# Patient Record
Sex: Female | Born: 1965
Health system: Southern US, Community
[De-identification: ages and names within clinical notes are randomized; demographics above are authoritative.]

## PROBLEM LIST (undated history)

## (undated) DIAGNOSIS — F329 Major depressive disorder, single episode, unspecified: Secondary | ICD-10-CM

## (undated) DIAGNOSIS — D4709 Other mast cell neoplasms of uncertain behavior: Secondary | ICD-10-CM

## (undated) DIAGNOSIS — L80 Vitiligo: Secondary | ICD-10-CM

## (undated) DIAGNOSIS — M069 Rheumatoid arthritis, unspecified: Secondary | ICD-10-CM

## (undated) DIAGNOSIS — F419 Anxiety disorder, unspecified: Secondary | ICD-10-CM

## (undated) DIAGNOSIS — E063 Autoimmune thyroiditis: Secondary | ICD-10-CM

## (undated) DIAGNOSIS — M1990 Unspecified osteoarthritis, unspecified site: Secondary | ICD-10-CM

## (undated) DIAGNOSIS — F32A Depression, unspecified: Secondary | ICD-10-CM

## (undated) DIAGNOSIS — E039 Hypothyroidism, unspecified: Secondary | ICD-10-CM

## (undated) HISTORY — PX: BREAST BIOPSY: SHX20

## (undated) HISTORY — PX: BREAST LUMPECTOMY: SHX2

## (undated) HISTORY — PX: APPENDECTOMY: SHX54

## (undated) HISTORY — PX: SHOULDER ACROMIOPLASTY: SHX6093

## (undated) HISTORY — PX: HIP SURGERY: SHX245

## (undated) HISTORY — PX: OTHER SURGICAL HISTORY: SHX169

## (undated) HISTORY — PX: CHOLECYSTECTOMY: SHX55

---

## 2007-02-07 HISTORY — PX: HIP SURGERY: SHX245

## 2017-10-30 ENCOUNTER — Emergency Department (INDEPENDENT_AMBULATORY_CARE_PROVIDER_SITE_OTHER)
Admission: EM | Admit: 2017-10-30 | Discharge: 2017-10-30 | Disposition: A | Discharge status: Home / Self Care | Payer: BLUE CROSS/BLUE SHIELD | Source: Home / Self Care | Attending: Family Medicine | Admitting: Family Medicine

## 2017-10-30 ENCOUNTER — Other Ambulatory Visit: Payer: Self-pay

## 2017-10-30 ENCOUNTER — Encounter: Payer: Self-pay | Admitting: Emergency Medicine

## 2017-10-30 DIAGNOSIS — L719 Rosacea, unspecified: Secondary | ICD-10-CM

## 2017-10-30 HISTORY — DX: Other mast cell neoplasms of uncertain behavior: D47.09

## 2017-10-30 HISTORY — DX: Major depressive disorder, single episode, unspecified: F32.9

## 2017-10-30 HISTORY — DX: Autoimmune thyroiditis: E06.3

## 2017-10-30 HISTORY — DX: Depression, unspecified: F32.A

## 2017-10-30 HISTORY — DX: Anxiety disorder, unspecified: F41.9

## 2017-10-30 MED ORDER — PREDNISONE 50 MG PO TABS: 50.0000 mg | ORAL_TABLET | Freq: Every day | ORAL | 0 refills | Status: AC

## 2017-10-30 MED ORDER — DEXAMETHASONE SODIUM PHOSPHATE 10 MG/ML IJ SOLN
10.0000 mg | Freq: Once | INTRAMUSCULAR | Status: AC
  Administered 2017-10-30: 10 mg via INTRAMUSCULAR

## 2017-10-30 MED ORDER — CETIRIZINE HCL 10 MG PO TABS: 10.0000 mg | ORAL_TABLET | Freq: Every day | ORAL | 0 refills | Status: DC

## 2017-10-30 MED ORDER — METRONIDAZOLE 0.75 % EX GEL: 1.0000 "application " | Freq: Two times a day (BID) | CUTANEOUS | 0 refills | Status: DC

## 2017-10-30 NOTE — Discharge Instructions (Signed)
°  You were given a shot of decadron (a steroid) today to help with itching and inflammation of your rash.  You have been prescribed 5 days of prednisone, an oral steroid.  You may start this medication tomorrow with breakfast.   Please follow up with family medicine or dermatology in 1 week if not improving.

## 2017-10-30 NOTE — ED Triage Notes (Signed)
Pt c/o rash to face that started on Monday.

## 2017-10-30 NOTE — ED Provider Notes (Signed)
[facility]    CSN: [id number] Arrival date & time: 10/30/17  2751     History   Chief Complaint Chief Complaint  Patient presents with  . Rash    Face    HPI Kayla Lindsey is a 52 y.o. female.   HPI  Kayla Lindsey is a 52 y.o. female presenting to UC with hx of rosacea c/o 1 week worsening red itching, burning irritating rash on her face c/w prior episodes of rosacea. She has been using her prescribed medications but notes one of the creams she was prescribed is starting to burn when she applies it.  Denies new soaps, lotions or medications. She has done well with prednisone in the past for her rash. She has also used metronidazole cream for flares.     Past Medical History:  Diagnosis Date  . Anxiety   . Depression   . Hashimoto's thyroiditis   . Mastocytosis     There are no active problems to display for this patient.   Past Surgical History:  Procedure Laterality Date  . APPENDECTOMY    . carpel tunnel    . CHOLECYSTECTOMY    . HIP SURGERY    . SHOULDER ACROMIOPLASTY      OB History   None      Home Medications    Prior to Admission medications   Medication Sig Start Date End Date Taking? Authorizing Provider  levothyroxine (SYNTHROID, LEVOTHROID) 125 MCG tablet Take 125 mcg by mouth daily before breakfast.   Yes [provider]  venlafaxine (EFFEXOR) 100 MG tablet Take 225 mg by mouth 2 (two) times daily.   Yes [provider]  cetirizine (ZYRTEC) 10 MG tablet Take 1 tablet (10 mg total) by mouth daily. 10/30/17   Noe Gens, PA-C  metroNIDAZOLE (METROGEL) 0.75 % gel Apply 1 application topically 2 (two) times daily. 10/30/17   Noe Gens, PA-C  predniSONE (DELTASONE) 50 MG tablet Take 1 tablet (50 mg total) by mouth daily with breakfast for 5 days. 10/30/17 11/04/17  Noe Gens, PA-C    Family History Family History  Problem Relation Age of Onset  . Thyroid disease Mother   . Heart disease Mother     . COPD Mother   . Cancer Mother   . COPD Father     Social History Social History   Tobacco Use  . Smoking status: Never Smoker  . Smokeless tobacco: Never Used  Substance Use Topics  . Alcohol use: Not Currently    Frequency: Never  . Drug use: Not Currently     Allergies   Patient has no known allergies.   Review of Systems Review of Systems  HENT: Negative for facial swelling and sore throat.   Skin: Positive for color change and rash. Negative for wound.     Physical Exam Triage Vital Signs ED Triage Vitals [10/30/17 1013]  Enc Vitals Group     BP 122/79     Pulse Rate 81     Resp      Temp 98.2 F (36.8 C)     Temp Source Oral     SpO2 99 %     Weight 166 lb (75.3 kg)     Height      Head Circumference      Peak Flow      Pain Score 3     Pain Loc      Pain Edu?      Excl.  in [place]?    No data found.  Updated Vital Signs BP 122/79 (BP Location: Left Arm)   Pulse 81   Temp 98.2 F (36.8 C) (Oral)   Wt 166 lb (75.3 kg)   LMP 10/02/2017 (Exact Date)   SpO2 99%   Visual Acuity Right Eye Distance:   Left Eye Distance:   Bilateral Distance:    Right Eye Near:   Left Eye Near:    Bilateral Near:     Physical Exam  Constitutional: She is oriented to person, place, and time. She appears well-developed and well-nourished. No distress.  HENT:  Head: Normocephalic and atraumatic.  Eyes: EOM are normal.  Neck: Normal range of motion.  Cardiovascular: Normal rate.  Pulmonary/Chest: Effort normal.  Musculoskeletal: Normal range of motion.  Neurological: She is alert and oriented to person, place, and time.  Skin: Skin is warm and dry. Rash noted. She is not diaphoretic. There is erythema.  Face: erythematous macular rash on cheeks and around eyes. Minimally tender. No bleeding or drainage.   Psychiatric: She has a normal mood and affect. Her behavior is normal.  Nursing note and vitals reviewed.    UC Treatments / Results  Labs (all labs  ordered are listed, but only abnormal results are displayed) Labs Reviewed - No data to display  EKG None  Radiology No results found.  Procedures Procedures (including critical care time)  Medications Ordered in UC Medications  dexamethasone (DECADRON) injection 10 mg (10 mg Intramuscular Given 10/30/17 1054)    Initial Impression / Assessment and Plan / UC Course  I have reviewed the triage vital signs and the nursing notes.  Pertinent labs & imaging results that were available during my care of the patient were reviewed by me and considered in my medical decision making (see chart for details).     Will tx with steroids and metronidazole Home instructions provided.  Final Clinical Impressions(s) / UC Diagnoses   Final diagnoses:  Rosacea     Discharge Instructions      You were given a shot of decadron (a steroid) today to help with itching and inflammation of your rash.  You have been prescribed 5 days of prednisone, an oral steroid.  You may start this medication tomorrow with breakfast.   Please follow up with family medicine or dermatology in 1 week if not improving.     ED Prescriptions    Medication Sig Dispense Auth. Provider   predniSONE (DELTASONE) 50 MG tablet Take 1 tablet (50 mg total) by mouth daily with breakfast for 5 days. 5 tablet Leeroy Cha O, PA-C   cetirizine (ZYRTEC) 10 MG tablet Take 1 tablet (10 mg total) by mouth daily. 30 tablet Gerarda Fraction, Karisma Meiser O, PA-C   metroNIDAZOLE (METROGEL) 0.75 % gel Apply 1 application topically 2 (two) times daily. 45 g Noe Gens, PA-C     Controlled Substance Prescriptions [place] Controlled Substance Registry consulted? Not Applicable   Tyrell Antonio 10/30/17 1239

## 2017-12-23 ENCOUNTER — Ambulatory Visit (INDEPENDENT_AMBULATORY_CARE_PROVIDER_SITE_OTHER): Payer: No Typology Code available for payment source | Admitting: Osteopathic Medicine

## 2017-12-23 ENCOUNTER — Encounter: Payer: Self-pay | Admitting: Osteopathic Medicine

## 2017-12-23 VITALS — BP 132/85 | HR 82 | Temp 98.0°F | Ht 67.0 in | Wt 174.0 lb

## 2017-12-23 DIAGNOSIS — L80 Vitiligo: Secondary | ICD-10-CM | POA: Insufficient documentation

## 2017-12-23 DIAGNOSIS — F32A Depression, unspecified: Secondary | ICD-10-CM | POA: Insufficient documentation

## 2017-12-23 DIAGNOSIS — F419 Anxiety disorder, unspecified: Secondary | ICD-10-CM | POA: Diagnosis not present

## 2017-12-23 DIAGNOSIS — K1370 Unspecified lesions of oral mucosa: Secondary | ICD-10-CM

## 2017-12-23 DIAGNOSIS — D4709 Other mast cell neoplasms of uncertain behavior: Secondary | ICD-10-CM

## 2017-12-23 DIAGNOSIS — L719 Rosacea, unspecified: Secondary | ICD-10-CM

## 2017-12-23 DIAGNOSIS — E039 Hypothyroidism, unspecified: Secondary | ICD-10-CM

## 2017-12-23 DIAGNOSIS — F329 Major depressive disorder, single episode, unspecified: Secondary | ICD-10-CM

## 2017-12-23 DIAGNOSIS — R1310 Dysphagia, unspecified: Secondary | ICD-10-CM

## 2017-12-23 DIAGNOSIS — R1319 Other dysphagia: Secondary | ICD-10-CM

## 2017-12-23 MED ORDER — VENLAFAXINE HCL ER 75 MG PO CP24: 225.0000 mg | ORAL_CAPSULE | Freq: Every day | ORAL | 1 refills | Status: DC

## 2017-12-23 MED ORDER — LEVOTHYROXINE SODIUM 125 MCG PO TABS: 125.0000 ug | ORAL_TABLET | Freq: Every day | ORAL | 3 refills | Status: DC

## 2017-12-23 MED ORDER — CETIRIZINE HCL 10 MG PO TABS: 10.0000 mg | ORAL_TABLET | Freq: Every day | ORAL | 3 refills | Status: DC

## 2017-12-23 MED ORDER — SODIUM SULFACETAMIDE 10 % EX SHAM: 1.0000 "application " | MEDICATED_SHAMPOO | Freq: Two times a day (BID) | CUTANEOUS | 11 refills | Status: DC

## 2017-12-23 MED ORDER — BUPROPION HCL ER (XL) 150 MG PO TB24: 150.0000 mg | ORAL_TABLET | Freq: Every day | ORAL | 1 refills | Status: DC

## 2017-12-23 MED ORDER — IVERMECTIN 1 % EX CREA: 1.0000 "application " | TOPICAL_CREAM | Freq: Two times a day (BID) | CUTANEOUS | 11 refills | Status: DC

## 2017-12-23 MED FILL — LEVOTHYROXINE 125 MCG TAB: 125 | 90 days supply | Qty: 90 | Fill #0

## 2017-12-23 MED FILL — VENLAFAXINE HCL ER 75 MG CA: 75 | 90 days supply | Qty: 270 | Fill #0

## 2017-12-23 MED FILL — buPROPion HCL ER (XL) 150 M: 150 | 30 days supply | Qty: 30 | Fill #0

## 2017-12-23 MED FILL — SOD SULFACETAMIDE 10% SHAMP: 10 | 30 days supply | Qty: 237 | Fill #0

## 2017-12-23 NOTE — Patient Instructions (Signed)
Plan:  Switch Allegra to cetirizine  Add Wellbutrin to Effexor  Refill Synthroid and rosacea medications  Monitor lump in throat/mouth, consider specialist referral if needed  Mammogram order placed today  Will request records today from previous Pap and colonoscopy

## 2017-12-23 NOTE — Progress Notes (Signed)
HPI: Kayla Lindsey is a 52 y.o. female who  has a past medical history of Anxiety, Depression, Hashimoto's thyroiditis, and Mastocytosis.  she presents to [facility] today, 12/23/17,  for chief complaint of: New to establish care Refill prescriptions for rosacea Discuss different prescriptions for anxiety/depression New problem: Lump in throat, lump on recent mouth   Doing well on current medications for rosacea, previously prescribed by dermatology, requests refill.  Depression has been a bit worse over the past couple of years.  She has been on fairly high dose of Effexor for years, withdrawal issues when tried to get off of this medication in the past.  She does not have any significant side effects from it and is reluctant to come off again but is struggling with moods.  Felt better on cetirizine than fexofenadine, would like to continue with this medication if possible for treatment of itching/mastocytosis and seasonal allergies.  Feels like there is a small lump on the risk of her mouth, has been going on for a couple of days, she has not been able to see anything coarse but she can feel or small rough patch toward the back of her throat on the right.  Feels like difficulty swallowing/lump in the throat.  Has been going on for about 2 weeks she only really feels it with swallowing.  No painful swallowing, no regurgitation, no abdominal pain, no GERD.   Available records reviewed 09/13/2017: Platelets 410 but no other concerns on CBC, TSH 0.672, concerns on CMP, TC 176, TG 60, HDL 74, LDL 90, glucose 101  Last visit with family med 09/13/2017, to establish care, this was in [place], documented diagnoses included major depressive disorder in partial remission (referral placed for psych), hypothyroidism, primary vitiligo (referal placed for Derm), screening for cervical cancer (referral placed for OB/GYN) and breast cancer, status post colonoscopy 5  years ago due to abdominal pain and this was reportedly normal.  History of abnormal mammogram and lumpectomy revealed ductal hyperplasia.  Also at that visit, started prescription for venlafaxine extended release 225 mg.  Patient was already taking Allegra-D for history of mastocytosis, Synthroid 125 mcg.    Past medical, surgical, social and family history reviewed:  Patient Active Problem List   Diagnosis Date Noted  . Lesion of mouth 12/23/2017  . Esophageal dysphagia 12/23/2017  . Mastocytosis 12/23/2017  . Vitiligo 12/23/2017  . Rosacea 12/23/2017  . Hypothyroidism 12/23/2017  . Anxiety and depression 12/23/2017    Past Surgical History:  Procedure Laterality Date  . APPENDECTOMY    . carpel tunnel    . CHOLECYSTECTOMY    . HIP SURGERY    . SHOULDER ACROMIOPLASTY      Social History   Tobacco Use  . Smoking status: Never Smoker  . Smokeless tobacco: Never Used  Substance Use Topics  . Alcohol use: Not Currently    Frequency: Never    Family History  Problem Relation Age of Onset  . Thyroid disease Mother   . Heart disease Mother   . COPD Mother   . Cancer Mother   . COPD Father      Current medication list and allergy/intolerance information reviewed:    Current Outpatient Medications  Medication Sig Dispense Refill  . cetirizine (ZYRTEC) 10 MG tablet Take 1 tablet (10 mg total) by mouth daily. 30 tablet 0  . levothyroxine (SYNTHROID, LEVOTHROID) 125 MCG tablet Take 125 mcg by mouth daily before breakfast.    . metroNIDAZOLE (METROGEL)  0.75 % gel Apply 1 application topically 2 (two) times daily. 45 g 0  . venlafaxine (EFFEXOR) 100 MG tablet Take 225 mg by mouth 2 (two) times daily.     No current facility-administered medications for this visit.     No Known Allergies    Review of Systems:  Constitutional:  No  fever, no chills, No recent illness, No unintentional weight changes. No significant fatigue.   HEENT: No  headache, no vision  change  Cardiac: No  chest pain, No  pressure, No palpitations, No  Orthopnea  Respiratory:  No  shortness of breath. No  Cough  Gastrointestinal: No  abdominal pain, No  nausea, No  vomiting,  No  blood in stool, No  diarrhea, No  Constipation, +mouth and swallowing issue as per HPI  Musculoskeletal: No new myalgia/arthralgia  Skin: No  Rash, No other wounds/concerning lesions  Genitourinary: No  incontinence, No  abnormal genital bleeding, No abnormal genital discharge  Hem/Onc: No  easy bruising/bleeding, No  abnormal lymph node  Endocrine: No cold intolerance,  No heat intolerance. No polyuria/polydipsia/polyphagia   Neurologic: No  weakness, No  dizziness, No  slurred speech/focal weakness/facial droop  Psychiatric: +concerns with depression, +concerns with anxiety, No sleep problems, No mood problems  Exam:  BP 132/85 (BP Location: Right Arm, Patient Position: Sitting, Cuff Size: Normal)   Pulse 82   Temp 98 F (36.7 C) (Oral)   Ht 5\' 7"  (1.702 m)   Wt 174 lb (78.9 kg)   BMI 27.25 kg/m   Constitutional: VS see above. General Appearance: alert, well-developed, well-nourished, NAD  Eyes: Normal lids and conjunctive, non-icteric sclera  Ears, Nose, Mouth, Throat: MMM, Normal external inspection ears/nares/mouth/lips/gums. TM normal bilaterally. Pharynx/tonsils no erythema, no exudate. Nasal mucosa normal.  No lesion visible on the roof of the mouth  Neck: No masses, trachea midline. No thyroid enlargement. No tenderness/mass appreciated. No lymphadenopathy  Respiratory: Normal respiratory effort. no wheeze, no rhonchi, no rales  Cardiovascular: S1/S2 normal, no murmur, no rub/gallop auscultated. RRR. No lower extremity edema.   Gastrointestinal: Nontender, no masses. No hepatomegaly, no splenomegaly. No hernia appreciated. Bowel sounds normal. Rectal exam deferred.   Musculoskeletal: Gait normal. No clubbing/cyanosis of digits.   Neurological: Normal  balance/coordination. No tremor. No cranial nerve deficit on limited exam. Motor and sensation intact and symmetric. Cerebellar reflexes intact.   Skin: warm, dry, intact. No rash/ulcer. No concerning nevi or subq nodules on limited exam.    Psychiatric: Normal judgment/insight. Normal mood and affect. Oriented x3.         ASSESSMENT/PLAN:   Anxiety and depression - Plan: venlafaxine XR (EFFEXOR-XR) 75 MG 24 hr capsule, buPROPion (WELLBUTRIN XL) 150 MG 24 hr tablet  Hypothyroidism, unspecified type - Plan: levothyroxine (SYNTHROID, LEVOTHROID) 125 MCG tablet  Rosacea - Plan: Ivermectin (SOOLANTRA) 1 % CREA, Sulfacetamide Sodium (SODIUM SULFACETAMIDE) 10 % SHAM  Vitiligo  Mastocytosis - Plan: cetirizine (ZYRTEC) 10 MG tablet  Esophageal dysphagia - Advised GI referral, patient would like to hold off.  Lesion of mouth - Consider ENT referral for possible further evaluation/biopsy, patient would like to hold off for now    Patient Instructions  Plan:  Switch Allegra to cetirizine  Add Wellbutrin to Effexor  Refill Synthroid and rosacea medications  Monitor lump in throat/mouth, consider specialist referral if needed  Mammogram order placed today  Will request records today from previous Pap and colonoscopy    Visit summary with medication list and pertinent instructions was printed for  patient to review. All questions at time of visit were answered - patient instructed to contact office with any additional concerns. ER/RTC precautions were reviewed with the patient.   Follow-up plan: Return in about 4 weeks (around 01/20/2018) for recheck on wellbutrin, see me sooner if needed.     Please note: voice recognition software was used to produce this document, and typos may escape review. Please contact Dr. Sheppard Coil for any needed clarifications.

## 2017-12-27 ENCOUNTER — Telehealth: Payer: Self-pay

## 2017-12-27 MED ORDER — SULFACETAMIDE SODIUM 10 % EX CREA: 1.0000 "application " | TOPICAL_CREAM | Freq: Two times a day (BID) | CUTANEOUS | 11 refills | Status: DC

## 2017-12-27 NOTE — Telephone Encounter (Signed)
Current rx sent is cream form. Pt requesting face wash. Pls advise, thanks.

## 2017-12-27 NOTE — Telephone Encounter (Signed)
Fixed it.

## 2017-12-27 NOTE — Telephone Encounter (Signed)
Can we call the pharmacy and see if they can just send [place] a request for whatever she needs?  In epic, I only see that I'm able to Rx shampoo, cream, foam, gel, ointment, liquid. Nothing specific for soap or wash.

## 2017-12-27 NOTE — Telephone Encounter (Signed)
Megan from [facility] called - as per pt, she needs face wash for Rosacea. Per pharmacy, sulfacetamide sodium sent as a shampoo rx. Pt did pick up the rx on Friday, but contacted pharmacy today stating that she has the wrong rx. Pls advise, thanks.

## 2017-12-28 MED ORDER — SULFACETAMIDE IN BAKUCHIOL 10 % EX LIQD: CUTANEOUS | 1 refills | Status: DC

## 2017-12-28 NOTE — Telephone Encounter (Signed)
I called and spoke with Manuela Schwartz at [facility] and she said to send the RX as a face wash and the pharmacist could fix as face wash for the patient.

## 2017-12-30 ENCOUNTER — Telehealth: Payer: Self-pay

## 2017-12-30 ENCOUNTER — Other Ambulatory Visit: Payer: Self-pay | Admitting: Osteopathic Medicine

## 2017-12-30 MED ORDER — AZELAIC ACID 15 % EX GEL: 1.0000 "application " | Freq: Two times a day (BID) | CUTANEOUS | 11 refills | Status: DC

## 2017-12-30 MED ORDER — AZELAIC ACID 15 % EX FOAM: 1.0000 "application " | Freq: Two times a day (BID) | CUTANEOUS | 1 refills | Status: DC

## 2017-12-30 NOTE — Telephone Encounter (Signed)
Whatever pharmacist recommends as an alternative is acceptable to me

## 2017-12-30 NOTE — Telephone Encounter (Signed)
As per [facility] foam is on long term backorder. Requesting to change med to gel form. Pls advise. Thanks.

## 2017-12-30 NOTE — Progress Notes (Signed)
fin

## 2017-12-30 NOTE — Telephone Encounter (Signed)
Pls send rx to pharmacy for finacea gel. Thanks.

## 2017-12-30 NOTE — Telephone Encounter (Addendum)
Rx fax form faxed to pharmacy with update. Confirmation rec'd.

## 2018-01-11 MED FILL — OVACE PLUS 10 % CREA: 10 | 30 days supply | Qty: 57 | Fill #0

## 2018-01-11 MED FILL — SODIUM SULFACETAMIDE 10% WA: 10 | 25 days supply | Qty: 177 | Fill #0

## 2018-01-17 ENCOUNTER — Other Ambulatory Visit: Payer: Self-pay | Admitting: Osteopathic Medicine

## 2018-01-17 DIAGNOSIS — Z1231 Encounter for screening mammogram for malignant neoplasm of breast: Secondary | ICD-10-CM

## 2018-01-20 ENCOUNTER — Encounter: Payer: Self-pay | Admitting: Osteopathic Medicine

## 2018-01-20 ENCOUNTER — Ambulatory Visit (INDEPENDENT_AMBULATORY_CARE_PROVIDER_SITE_OTHER): Payer: No Typology Code available for payment source | Admitting: Osteopathic Medicine

## 2018-01-20 VITALS — BP 118/71 | HR 86 | Temp 97.8°F | Wt 168.9 lb

## 2018-01-20 DIAGNOSIS — F329 Major depressive disorder, single episode, unspecified: Secondary | ICD-10-CM

## 2018-01-20 DIAGNOSIS — F419 Anxiety disorder, unspecified: Secondary | ICD-10-CM | POA: Diagnosis not present

## 2018-01-20 DIAGNOSIS — F32A Depression, unspecified: Secondary | ICD-10-CM

## 2018-01-20 MED ORDER — VENLAFAXINE HCL ER 37.5 MG PO CP24: 37.5000 mg | ORAL_CAPSULE | Freq: Every day | ORAL | 0 refills | Status: DC

## 2018-01-20 MED ORDER — ESCITALOPRAM OXALATE 10 MG PO TABS: 10.0000 mg | ORAL_TABLET | Freq: Every day | ORAL | 0 refills | Status: DC

## 2018-01-20 MED FILL — VENLAFAXINE HCL ER 37.5 MG: 37.5 | 30 days supply | Qty: 30 | Fill #0

## 2018-01-20 MED FILL — ESCITALOPRAM 10 MG TABLET: 10 | 90 days supply | Qty: 90 | Fill #0

## 2018-01-20 NOTE — Progress Notes (Signed)
HPI: Kayla Lindsey is a 51 y.o. female who  has a past medical history of Anxiety, Depression, Hashimoto's thyroiditis, and Mastocytosis.  she presents to [facility] today, 01/20/18,  for chief complaint of:  Follow-up: Anxiety/depression  At last visit 1 month ago, 12/23/2017: "Depression has been a bit worse over the past couple of years.  She has been on fairly high dose of Effexor for years, withdrawal issues when tried to get off of this medication in the past.  She does not have any significant side effects from it and is reluctant to come off again but is struggling with moods."  Sided to add Wellbutrin to Effexor and follow-up for recheck.  Today, patient reports she is doing well with the addition of the Wellbutrin, no significant side effects.  She has some concerns for not sleeping very well and occasional night sweats but otherwise she is doing fine.       Past medical history, surgical history, and family history reviewed.  Current medication list and allergy/intolerance information reviewed.   (See remainder of HPI, ROS, Phys Exam below)      ASSESSMENT/PLAN:   Anxiety and depression Would like to get off Effexor but has had withdrawal problems before. Mom does well on Lexapro and she'd like to try this for anxiety.    Meds ordered this encounter  Medications  . escitalopram (LEXAPRO) 10 MG tablet    Sig: Take 1 tablet (10 mg total) by mouth daily. Evenings preferred. Take as directed    Dispense:  90 tablet    Refill:  0  . venlafaxine XR (EFFEXOR-XR) 37.5 MG 24 hr capsule    Sig: Take 1 capsule (37.5 mg total) by mouth daily with breakfast. As directed    Dispense:  30 capsule    Refill:  0    Patient Instructions  Stay on Wellbutrin  Follow this medication schedule for 1-2 weeks each,  depending how you're feeling:  Effexor 75 mg, 2 capsules per day  Then Effexor 75 mg, 1 capsule per day +   Lexapro 10 mg, 0.5  tablet per day  Then Effexor 37.5 mg, 1 capsule per day +   Lexapro 10 mg, 1 tablet per day   Then Lexapro 10 mg, 1 tablet per day  Option to increase Lexapro 10 mg to 2 tablets per day for 20 mg total, or can maintain for a little while on 10 mg dose if you're doing well    Follow-up plan: Return in about 4 weeks (around 02/17/2018) for monitor switching from Effexor to Lexapro .                           ############################################ ############################################ ############################################ ############################################    Outpatient Encounter Medications as of 01/20/2018  Medication Sig  . Azelaic Acid 15 % cream Apply 1 application topically 2 (two) times daily. After skin is thoroughly washed and patted dry  . buPROPion (WELLBUTRIN XL) 150 MG 24 hr tablet Take 1 tablet (150 mg total) by mouth daily.  . cetirizine (ZYRTEC) 10 MG tablet Take 1 tablet (10 mg total) by mouth daily.  Marland Kitchen levothyroxine (SYNTHROID, LEVOTHROID) 125 MCG tablet Take 1 tablet (125 mcg total) by mouth daily before breakfast.  . Sulfacetamide in Bakuchiol (SODIUM SULFACETAMIDE WASH) 10 % LIQD Apply to face topically twice a day as face wash.  . venlafaxine XR (EFFEXOR-XR) 75 MG 24 hr capsule Take 3 capsules (  225 mg total) by mouth daily with breakfast.   No facility-administered encounter medications on file as of 01/20/2018.    Not on File    Review of Systems:  Constitutional: No recent illness  Cardiac: No  chest pain, No  pressure, No palpitations  Respiratory:  No  shortness of breath. No  Cough  Gastrointestinal: No  abdominal pain  Musculoskeletal: No new myalgia/arthralgia  Skin: No  Rash  Hem/Onc: No  easy bruising/bleeding, No  abnormal lumps/bumps  Neurologic: No  weakness, No  Dizziness  Psychiatric: No  concerns with depression, +concerns with anxiety, +sleep problems   Exam:  BP 118/71 (BP  Location: Left Arm, Patient Position: Sitting, Cuff Size: Normal)   Pulse 86   Temp 97.8 F (36.6 C) (Oral)   Wt 168 lb 14.4 oz (76.6 kg)   BMI 26.45 kg/m   Constitutional: VS see above. General Appearance: alert, well-developed, well-nourished, NAD  Eyes: Normal lids and conjunctive, non-icteric sclera  Ears, Nose, Mouth, Throat: MMM, Normal external inspection ears/nares/mouth/lips/gums.  Neck: No masses, trachea midline.   Respiratory: Normal respiratory effort.   Musculoskeletal: Gait normal. Symmetric and independent movement of all extremities  Neurological: Normal balance/coordination. No tremor.  Skin: warm, dry, intact.   Psychiatric: Normal judgment/insight. Normal mood and affect. Oriented x3.   Visit summary with medication list and pertinent instructions was printed for patient to review, advised to alert [place] if any changes needed. All questions at time of visit were answered - patient instructed to contact office with any additional concerns. ER/RTC precautions were reviewed with the patient and understanding verbalized.   Follow-up plan: Return in about 4 weeks (around 02/17/2018) for monitor switching from Effexor to Lexapro .  Note: Total time spent 25 minutes, greater than 50% of the visit was spent face-to-face counseling and coordinating care for the following: The encounter diagnosis was Anxiety and depression..  Please note: voice recognition software was used to produce this document, and typos may escape review. Please contact Dr. Sheppard Coil for any needed clarifications.

## 2018-01-20 NOTE — Patient Instructions (Addendum)
Stay on Wellbutrin  Follow this medication schedule for 1-2 weeks each,  depending how you're feeling:  Effexor 75 mg, 2 capsules per day  Then Effexor 75 mg, 1 capsule per day +   Lexapro 10 mg, 0.5 tablet per day  Then Effexor 37.5 mg, 1 capsule per day +   Lexapro 10 mg, 1 tablet per day   Then Lexapro 10 mg, 1 tablet per day  Option to increase Lexapro 10 mg to 2 tablets per day for 20 mg total, or can maintain for a little while on 10 mg dose if you're doing well

## 2018-01-24 MED FILL — buPROPion HCL ER (XL) 150 M: 150 | 30 days supply | Qty: 30 | Fill #1

## 2018-02-01 ENCOUNTER — Ambulatory Visit (INDEPENDENT_AMBULATORY_CARE_PROVIDER_SITE_OTHER): Payer: No Typology Code available for payment source | Admitting: Osteopathic Medicine

## 2018-02-01 VITALS — BP 126/78 | HR 89 | Temp 98.2°F | Wt 172.9 lb

## 2018-02-01 DIAGNOSIS — K645 Perianal venous thrombosis: Secondary | ICD-10-CM | POA: Diagnosis not present

## 2018-02-01 MED ORDER — DOCUSATE SODIUM 100 MG PO CAPS: 100.0000 mg | ORAL_CAPSULE | Freq: Three times a day (TID) | ORAL | 0 refills | Status: DC | PRN

## 2018-02-01 MED ORDER — HYDROCORTISONE ACETATE 25 MG RE SUPP: 25.0000 mg | Freq: Two times a day (BID) | RECTAL | 2 refills | Status: DC | PRN

## 2018-02-01 MED ORDER — LIDOCAINE HCL 2 % EX GEL: 1.0000 "application " | CUTANEOUS | 1 refills | Status: DC | PRN

## 2018-02-01 MED ORDER — LIDOCAINE 4 % EX CREA: 1.0000 "application " | TOPICAL_CREAM | CUTANEOUS | 0 refills | Status: DC | PRN

## 2018-02-01 MED FILL — LIDOCAINE HCL 2% JELLY: 2 | 7 days supply | Qty: 30 | Fill #0

## 2018-02-01 MED FILL — HYDROCORTISONE ACETATE 25 M: 25 | 6 days supply | Qty: 12 | Fill #0

## 2018-02-01 NOTE — Patient Instructions (Addendum)
Meds ordered this encounter  Medications  . hydrocortisone (ANUSOL-HC) 25 MG suppository -  Steroid suppository to help hemorrhoid inflammation    Sig: Place 1 suppository (25 mg total) rectally 2 (two) times daily as needed for hemorrhoids.    Dispense:  12 suppository    Refill:  2  . lidocaine (XYLOCAINE) 2 % jelly -  Numbing medicine - apply to hemorrhoid, avoid getting this inside the rectum     Sig: Apply 1 application topically as needed. Every 2-3 hours    Dispense:  8 mL    Refill:  1  . docusate sodium (COLACE) 100 MG capsule Stool softener    Sig: Take 1 capsule (100 mg total) by mouth 3 (three) times daily as needed.    Dispense:  30 capsule    Refill:  0      Hemorrhoids Hemorrhoids are swollen veins in and around the rectum or anus. There are two types of hemorrhoids:  Internal hemorrhoids. These occur in the veins that are just inside the rectum. They may poke through to the outside and become irritated and painful.  External hemorrhoids. These occur in the veins that are outside of the anus and can be felt as a painful swelling or hard lump near the anus.  Most hemorrhoids do not cause serious problems, and they can be managed with home treatments such as diet and lifestyle changes. If home treatments do not help your symptoms, procedures can be done to shrink or remove the hemorrhoids. What are the causes? This condition is caused by increased pressure in the anal area. This pressure may result from various things, including:  Constipation.  Straining to have a bowel movement.  Diarrhea.  Pregnancy.  Obesity.  Sitting for long periods of time.  Heavy lifting or other activity that causes you to strain.  Anal sex.  What are the signs or symptoms? Symptoms of this condition include:  Pain.  Anal itching or irritation.  Rectal bleeding.  Leakage of stool (feces).  Anal swelling.  One or more lumps around the anus.  How is this  diagnosed? This condition can often be diagnosed through a visual exam. Other exams or tests may also be done, such as:  Examination of the rectal area with a gloved hand (digital rectal exam).  Examination of the anal canal using a small tube (anoscope).  A blood test, if you have lost a significant amount of blood.  A test to look inside the colon (sigmoidoscopy or colonoscopy).  How is this treated? This condition can usually be treated at home. However, various procedures may be done if dietary changes, lifestyle changes, and other home treatments do not help your symptoms. These procedures can help make the hemorrhoids smaller or remove them completely. Some of these procedures involve surgery, and others do not. Common procedures include:  Rubber band ligation. Rubber bands are placed at the base of the hemorrhoids to cut off the blood supply to them.  Sclerotherapy. Medicine is injected into the hemorrhoids to shrink them.  Infrared coagulation. A type of light energy is used to get rid of the hemorrhoids.  Hemorrhoidectomy surgery. The hemorrhoids are surgically removed, and the veins that supply them are tied off.  Stapled hemorrhoidopexy surgery. A circular stapling device is used to remove the hemorrhoids and use staples to cut off the blood supply to them.  Follow these instructions at home: Eating and drinking  Eat foods that have a lot of fiber in them, such  as whole grains, beans, nuts, fruits, and vegetables. Ask your health care provider about taking products that have added fiber (fiber supplements).  Drink enough fluid to keep your urine clear or pale yellow. Managing pain and swelling  Take warm sitz baths for 20 minutes, 3-4 times a day to ease pain and discomfort.  If directed, apply ice to the affected area. Using ice packs between sitz baths may be helpful. ? Put ice in a plastic bag. ? Place a towel between your skin and the bag. ? Leave the ice on for 20  minutes, 2-3 times a day. General instructions  Take over-the-counter and prescription medicines only as told by your health care provider.  Use medicated creams or suppositories as told.  Exercise regularly.  Go to the bathroom when you have the urge to have a bowel movement. Do not wait.  Avoid straining to have bowel movements.  Keep the anal area dry and clean. Use wet toilet paper or moist towelettes after a bowel movement.  Do not sit on the toilet for long periods of time. This increases blood pooling and pain. Contact a health care provider if:  You have increasing pain and swelling that are not controlled by treatment or medicine.  You have uncontrolled bleeding.  You have difficulty having a bowel movement, or you are unable to have a bowel movement.  You have pain or inflammation outside the area of the hemorrhoids. This information is not intended to replace advice given to you by your health care provider. Make sure you discuss any questions you have with your health care provider. Document Released: 02/21/2000 Document Revised: 07/24/2015 Document Reviewed: 11/07/2014 Elsevier Interactive Patient Education  [employer].

## 2018-02-01 NOTE — Progress Notes (Signed)
HPI: Kayla Lindsey is a 52 y.o. female who  has a past medical history of Anxiety, Depression, Hashimoto's thyroiditis, and Mastocytosis.  she presents to [facility] today, 02/01/18,  for chief complaint of:  Hemorrhoid  Constipatin, hard BM over the weekend after doing a salt cleanse. After this, the hemorrhoid appeared. Painful w/ sitting/standing, w/ BM, pain is waking her up. Has been having some bleeding w/ BM and wiping and staining her underwear. Pain still present    Patient is accompanied by husband who assists with history-taking.    At today's visit... Past medical history, surgical history, and family history reviewed and updated as needed.  Current medication list and allergy/intolerance information reviewed and updated as needed. (See remainder of HPI, ROS, Phys Exam below)           ASSESSMENT/PLAN: The encounter diagnosis was External thrombosed hemorrhoids.   Procedure note: Excision of external hemorrhoid thrombosis.  Risks versus benefits of procedure were explained in detail to the patient including but not limited to risk of bleeding, infection, pain, damage to surrounding tissues, recurrence or persistence of current symptoms.  Patient opted to proceed.  The area was sterilized and draped in the usual fashion, anesthesia was obtained with 2 cc of lidocaine with epinephrine.  Incision was made with scalpel and hemorrhoid was explored with a hemostat, a few small clots were removed.  Patient tolerated procedure well.     Meds ordered this encounter  Medications  . hydrocortisone (ANUSOL-HC) 25 MG suppository    Sig: Place 1 suppository (25 mg total) rectally 2 (two) times daily as needed for hemorrhoids.    Dispense:  12 suppository    Refill:  2  . lidocaine (XYLOCAINE) 2 % jelly    Sig: Apply 1 application topically as needed. Every 2-3 hours    Dispense:  8 mL    Refill:  1  . docusate sodium (COLACE) 100 MG  capsule    Sig: Take 1 capsule (100 mg total) by mouth 3 (three) times daily as needed.    Dispense:  30 capsule    Refill:  0  . lidocaine (LMX) 4 % cream    Sig: Apply 1 application topically as needed.    Dispense:  30 g    Refill:  0    Patient Instructions   Meds ordered this encounter  Medications  . hydrocortisone (ANUSOL-HC) 25 MG suppository -  Steroid suppository to help hemorrhoid inflammation    Sig: Place 1 suppository (25 mg total) rectally 2 (two) times daily as needed for hemorrhoids.    Dispense:  12 suppository    Refill:  2  . lidocaine (XYLOCAINE) 2 % jelly -  Numbing medicine - apply to hemorrhoid, avoid getting this inside the rectum     Sig: Apply 1 application topically as needed. Every 2-3 hours    Dispense:  8 mL    Refill:  1  . docusate sodium (COLACE) 100 MG capsule Stool softener    Sig: Take 1 capsule (100 mg total) by mouth 3 (three) times daily as needed.    Dispense:  30 capsule    Refill:  0      Hemorrhoids Hemorrhoids are swollen veins in and around the rectum or anus. There are two types of hemorrhoids:  Internal hemorrhoids. These occur in the veins that are just inside the rectum. They may poke through to the outside and become irritated and painful.  External hemorrhoids. These  occur in the veins that are outside of the anus and can be felt as a painful swelling or hard lump near the anus.  Most hemorrhoids do not cause serious problems, and they can be managed with home treatments such as diet and lifestyle changes. If home treatments do not help your symptoms, procedures can be done to shrink or remove the hemorrhoids. What are the causes? This condition is caused by increased pressure in the anal area. This pressure may result from various things, including:  Constipation.  Straining to have a bowel movement.  Diarrhea.  Pregnancy.  Obesity.  Sitting for long periods of time.  Heavy lifting or other activity that  causes you to strain.  Anal sex.  What are the signs or symptoms? Symptoms of this condition include:  Pain.  Anal itching or irritation.  Rectal bleeding.  Leakage of stool (feces).  Anal swelling.  One or more lumps around the anus.  How is this diagnosed? This condition can often be diagnosed through a visual exam. Other exams or tests may also be done, such as:  Examination of the rectal area with a gloved hand (digital rectal exam).  Examination of the anal canal using a small tube (anoscope).  A blood test, if you have lost a significant amount of blood.  A test to look inside the colon (sigmoidoscopy or colonoscopy).  How is this treated? This condition can usually be treated at home. However, various procedures may be done if dietary changes, lifestyle changes, and other home treatments do not help your symptoms. These procedures can help make the hemorrhoids smaller or remove them completely. Some of these procedures involve surgery, and others do not. Common procedures include:  Rubber band ligation. Rubber bands are placed at the base of the hemorrhoids to cut off the blood supply to them.  Sclerotherapy. Medicine is injected into the hemorrhoids to shrink them.  Infrared coagulation. A type of light energy is used to get rid of the hemorrhoids.  Hemorrhoidectomy surgery. The hemorrhoids are surgically removed, and the veins that supply them are tied off.  Stapled hemorrhoidopexy surgery. A circular stapling device is used to remove the hemorrhoids and use staples to cut off the blood supply to them.  Follow these instructions at home: Eating and drinking  Eat foods that have a lot of fiber in them, such as whole grains, beans, nuts, fruits, and vegetables. Ask your health care provider about taking products that have added fiber (fiber supplements).  Drink enough fluid to keep your urine clear or pale yellow. Managing pain and swelling  Take warm sitz  baths for 20 minutes, 3-4 times a day to ease pain and discomfort.  If directed, apply ice to the affected area. Using ice packs between sitz baths may be helpful. ? Put ice in a plastic bag. ? Place a towel between your skin and the bag. ? Leave the ice on for 20 minutes, 2-3 times a day. General instructions  Take over-the-counter and prescription medicines only as told by your health care provider.  Use medicated creams or suppositories as told.  Exercise regularly.  Go to the bathroom when you have the urge to have a bowel movement. Do not wait.  Avoid straining to have bowel movements.  Keep the anal area dry and clean. Use wet toilet paper or moist towelettes after a bowel movement.  Do not sit on the toilet for long periods of time. This increases blood pooling and pain. Contact a health  care provider if:  You have increasing pain and swelling that are not controlled by treatment or medicine.  You have uncontrolled bleeding.  You have difficulty having a bowel movement, or you are unable to have a bowel movement.  You have pain or inflammation outside the area of the hemorrhoids. This information is not intended to replace advice given to you by your health care provider. Make sure you discuss any questions you have with your health care provider. Document Released: 02/21/2000 Document Revised: 07/24/2015 Document Reviewed: 11/07/2014 Elsevier Interactive Patient Education  2018 [employer].      Follow-up plan: Return if symptoms worsen or fail to improve.                             ############################################ ############################################ ############################################ ############################################    No outpatient medications have been marked as taking for the 02/01/18 encounter (Appointment) with Emeterio Reeve, DO.    No Known Allergies     Review of  Systems:  Constitutional: No recent illness  Gastrointestinal: hemorrhoid as per HPI  Musculoskeletal: No new myalgia/arthralgia  Skin: No  Rash  Neurologic: No  weakness, No  Dizziness  Psychiatric: No  concerns with depression, No  concerns with anxiety  Exam:  BP 126/78 (BP Location: Left Arm, Patient Position: Sitting, Cuff Size: Normal)   Pulse 89   Temp 98.2 F (36.8 C) (Oral)   Wt 172 lb 14.4 oz (78.4 kg)   BMI 27.08 kg/m   Constitutional: VS see above. General Appearance: alert, well-developed, well-nourished, NAD  Eyes: Normal lids and conjunctive, non-icteric sclera  Ears, Nose, Mouth, Throat: MMM, Normal external inspection ears/nares/mouth/lips/gums.  Neck: No masses, trachea midline.   Respiratory: Normal respiratory effort.   Musculoskeletal: Gait normal. Symmetric and independent movement of all extremities  Abdominal: Rectal exam demonstrates hemorrhoid external size of small grape, exquisitely tender, on digital rectal exam it extends up into the rectum minimally but I cannot feel any large internal hemorrhoids or other masses.  See above for procedure note.  Neurological: Normal balance/coordination. No tremor.  Skin: warm, dry, intact.   Psychiatric: Normal judgment/insight. Normal mood and affect. Oriented x3.       Visit summary with medication list and pertinent instructions was printed for patient to review, patient was advised to alert [place] if any updates are needed. All questions at time of visit were answered - patient instructed to contact office with any additional concerns. ER/RTC precautions were reviewed with the patient and understanding verbalized.     Please note: voice recognition software was used to produce this document, and typos may escape review. Please contact Dr. Sheppard Coil for any needed clarifications.    Follow up plan: Return if symptoms worsen or fail to improve.

## 2018-02-02 ENCOUNTER — Encounter: Payer: Self-pay | Admitting: Osteopathic Medicine

## 2018-02-02 ENCOUNTER — Ambulatory Visit (INDEPENDENT_AMBULATORY_CARE_PROVIDER_SITE_OTHER): Payer: No Typology Code available for payment source

## 2018-02-02 DIAGNOSIS — Z1231 Encounter for screening mammogram for malignant neoplasm of breast: Secondary | ICD-10-CM | POA: Diagnosis not present

## 2018-02-07 ENCOUNTER — Encounter: Payer: Self-pay | Admitting: Family Medicine

## 2018-02-07 ENCOUNTER — Ambulatory Visit (INDEPENDENT_AMBULATORY_CARE_PROVIDER_SITE_OTHER): Payer: No Typology Code available for payment source | Admitting: Family Medicine

## 2018-02-07 VITALS — BP 133/71 | HR 74 | Wt 167.0 lb

## 2018-02-07 DIAGNOSIS — M25511 Pain in right shoulder: Secondary | ICD-10-CM | POA: Diagnosis not present

## 2018-02-07 MED ORDER — DICLOFENAC SODIUM 1 % TD GEL: 2.0000 g | Freq: Four times a day (QID) | TRANSDERMAL | 11 refills | Status: DC

## 2018-02-07 MED FILL — DICLOFENAC SODIUM 1% GEL: 1 | 12 days supply | Qty: 100 | Fill #0

## 2018-02-07 NOTE — Patient Instructions (Addendum)
Thank you for coming in today. Call or go to the ER if you develop a large red swollen joint with extreme pain or oozing puss.   Attend PT.   Use diclofenac gel as needed topically.    Recheck in 4 weeks  Return sooner if needed.   We will request records.

## 2018-02-07 NOTE — Progress Notes (Signed)
Note duplication error 

## 2018-02-07 NOTE — Progress Notes (Signed)
Kayla Lindsey is a 52 y.o. female who presents to [place] today for right shoulder pain. Mrs. Kayla Lindsey has a history of prior right shoulder surgeries. She has had rotator cuff surgery and biceps tenodesis. Her right shoulder has been slightly bothersome lately; she specifically notes difficulty and pain when putting on her bra. Yesterday, she was helping lift a heavy table into a truck, when she noticed a severe pain in her right shoulder. She can pinpoint the pain over the glenohumeral joint and the pain is constantly shooting down her arm to her elbow. The pain radiates into the chest and trapezius. She has taken some Advil and Tylenol which she notes too the edge off.    ROS:  As above  Exam:  BP 133/71   Pulse 74   Wt 167 lb (75.8 kg)   BMI 26.16 kg/m  General: Well Developed, well nourished, and in no acute distress.  Neuro/Psych: Alert and oriented x3, extra-ocular muscles intact, able to move all 4 extremities, sensation grossly intact. Skin: Warm and dry, no rashes noted.  Respiratory: Not using accessory muscles, speaking in full sentences, trachea midline.  Cardiovascular: Pulses palpable, no extremity edema. Abdomen: Does not appear distended. MSK:  Right shoulder:  Mildly tender to palpation overlying bicipital groove. ROM limited by pain with abduction and internal rotation.  Normal external rotation.  Strength intact with abduction external/internal rotation however painful with abduction. Positive empty can test.  Luan Pulling and Neers tests not performed due to pain and guarding. Negative Yergason's and speeds test. .   Left shoulder: Normal-appearing, no tenderness to palpation, normal ROM, no pain, normal strength. Pulses and capillary refill normal.   Pulses and capillary refill are intact bilateral lumbar extremities.  Lab and Radiology Results Limited musculoskeletal ultrasound of the right shoulder  reveals intact supraspinatus tendon.  Thickened subacromial bursa present.  Absence of proximal biceps tendon at the bicipital groove structure present.  This area is mildly tender to palpation with ultrasound probe.  Procedure: Real-time Ultrasound Guided Injection of right shoulder subacromial space Device: GE Logiq E   Images permanently stored and available for review in the ultrasound unit. Verbal informed consent obtained.  Discussed risks and benefits of procedure. Warned about infection bleeding damage to structures skin hypopigmentation and fat atrophy among others. Patient expresses understanding and agreement Time-out conducted.   Noted no overlying erythema, induration, or other signs of local infection.   Skin prepped in a sterile fashion.   Local anesthesia: Topical Ethyl chloride.   With sterile technique and under real time ultrasound guidance:  40 mg of Kenalog and 2 mL of Marcaine injected easily.   Completed without difficulty   Pain immediately resolved suggesting accurate placement of the medication.   Advised to call if fevers/chills, erythema, induration, drainage, or persistent bleeding.   Images permanently stored and available for review in the ultrasound unit.  Impression: Technically successful ultrasound guided injection.         Assessment and Plan: 52 y.o. female with right shoulder pain with rotator cuff tendinitis present.  Patient does have a pertinent past surgical history for biceps tenodesis I suspect the hypoechoic structure I was seen on ultrasound is likely the result of this.  Reinjury pattern is consistent with biceps tendon rupture however exam is largely benign.  Plan for referral to physical therapy diclofenac gel and an injection as above.  Recheck in about 4 weeks.  Return sooner if needed.    Orders Placed This Encounter  Procedures  . Ambulatory referral to Physical Therapy    Referral Priority:   Routine    Referral Type:   Physical  Medicine    Referral Reason:   Specialty Services Required    Requested Specialty:   Physical Therapy   Meds ordered this encounter  Medications  . diclofenac sodium (VOLTAREN) 1 % GEL    Sig: Apply 2 g topically 4 (four) times daily. To affected joint.    Dispense:  100 g    Refill:  11    Historical information moved to improve visibility of documentation.  Past Medical History:  Diagnosis Date  . Anxiety   . Depression   . Hashimoto's thyroiditis   . Mastocytosis    Past Surgical History:  Procedure Laterality Date  . APPENDECTOMY    . BREAST BIOPSY Bilateral   . BREAST LUMPECTOMY Right   . carpel tunnel    . CHOLECYSTECTOMY    . HIP SURGERY    . SHOULDER ACROMIOPLASTY     Social History   Tobacco Use  . Smoking status: Never Smoker  . Smokeless tobacco: Never Used  Substance Use Topics  . Alcohol use: Not Currently    Frequency: Never   family history includes COPD in her father and mother; Cancer in her mother; Esophageal cancer in her mother; Heart disease in her mother; Thyroid disease in her mother.  Medications: Current Outpatient Medications  Medication Sig Dispense Refill  . Azelaic Acid 15 % cream Apply 1 application topically 2 (two) times daily. After skin is thoroughly washed and patted dry 50 g 11  . buPROPion (WELLBUTRIN XL) 150 MG 24 hr tablet Take 1 tablet (150 mg total) by mouth daily. 30 tablet 1  . cetirizine (ZYRTEC) 10 MG tablet Take 1 tablet (10 mg total) by mouth daily. 90 tablet 3  . docusate sodium (COLACE) 100 MG capsule Take 1 capsule (100 mg total) by mouth 3 (three) times daily as needed. 30 capsule 0  . escitalopram (LEXAPRO) 10 MG tablet Take 1 tablet (10 mg total) by mouth daily. Evenings preferred. Take as directed 90 tablet 0  . hydrocortisone (ANUSOL-HC) 25 MG suppository Place 1 suppository (25 mg total) rectally 2 (two) times daily as needed for hemorrhoids. 12 suppository 2  . levothyroxine (SYNTHROID, LEVOTHROID) 125 MCG  tablet Take 1 tablet (125 mcg total) by mouth daily before breakfast. 90 tablet 3  . lidocaine (LMX) 4 % cream Apply 1 application topically as needed. 30 g 0  . lidocaine (XYLOCAINE) 2 % jelly Apply 1 application topically as needed. Every 2-3 hours 8 mL 1  . Sulfacetamide in Bakuchiol (SODIUM SULFACETAMIDE WASH) 10 % LIQD Apply to face topically twice a day as face wash. 1 Bottle 1  . venlafaxine XR (EFFEXOR-XR) 37.5 MG 24 hr capsule Take 1 capsule (37.5 mg total) by mouth daily with breakfast. As directed 30 capsule 0  . venlafaxine XR (EFFEXOR-XR) 75 MG 24 hr capsule Take 3 capsules (225 mg total) by mouth daily with breakfast. 270 capsule 1  . diclofenac sodium (VOLTAREN) 1 % GEL Apply 2 g topically 4 (four) times daily. To affected joint. 100 g 11   No current facility-administered medications for this visit.    Not on File    Discussed warning signs or symptoms. Please see discharge instructions. Patient expresses understanding.  I personally was present and performed or re-performed the history, physical exam and medical  decision-making activities of this service and have verified that the service and findings are accurately documented in the student's note. ___________________________________________ Lynne Leader M.D., ABFM., CAQSM. Primary Care and Sports Medicine Adjunct Instructor of [employer] [employer] [facility] [employer]

## 2018-02-11 ENCOUNTER — Encounter: Payer: Self-pay | Admitting: Rehabilitative and Restorative Service Providers"

## 2018-02-11 ENCOUNTER — Ambulatory Visit (INDEPENDENT_AMBULATORY_CARE_PROVIDER_SITE_OTHER): Payer: No Typology Code available for payment source | Admitting: Rehabilitative and Restorative Service Providers"

## 2018-02-11 ENCOUNTER — Other Ambulatory Visit: Payer: Self-pay

## 2018-02-11 DIAGNOSIS — R293 Abnormal posture: Secondary | ICD-10-CM | POA: Diagnosis not present

## 2018-02-11 DIAGNOSIS — M25511 Pain in right shoulder: Secondary | ICD-10-CM

## 2018-02-11 DIAGNOSIS — R29898 Other symptoms and signs involving the musculoskeletal system: Secondary | ICD-10-CM

## 2018-02-11 NOTE — Therapy (Signed)
[place], [place], [address] Phone: [phone]   Fax:  [phone]  Physical Therapy Evaluation  Patient Details  Name: Kayla Lindsey MRN: [id number] Date of Birth: Dec 24, 1965 Referring Provider (PT): Dr Lynne Leader    Encounter Date: 02/11/2018  PT End of Session - 02/11/18 1531    Visit Number  1    Number of Visits  12    Date for PT Re-Evaluation  03/25/18    PT Start Time  4944    PT Stop Time  1625    PT Time Calculation (min)  55 min    Activity Tolerance  Patient tolerated treatment well       Past Medical History:  Diagnosis Date  . Anxiety   . Depression   . Hashimoto's thyroiditis   . Mastocytosis     Past Surgical History:  Procedure Laterality Date  . APPENDECTOMY    . BREAST BIOPSY Bilateral   . BREAST LUMPECTOMY Right   . carpel tunnel    . CHOLECYSTECTOMY    . HIP SURGERY    . SHOULDER ACROMIOPLASTY      There were no vitals filed for this visit.   Subjective Assessment - 02/11/18 1533    Subjective  Shoulder injury 02/06/18 when lifting a dining room table whe table weight shifted. Patient injured Rt shd and Lt thumb. Seen by MD 12/2/19and received injection with some improvement.     Pertinent History  Hx of 2 surgeries Rt shoulder - impingement/bone spurs/RC tear 2015; second surgery biceps tenodesis 1/18; mastocytosis    Diagnostic tests  [place]     Patient Stated Goals  learn how to care for shoulder; increase ROM; stop pain     Currently in Pain?  Yes    Pain Score  3     Pain Location  Shoulder    Pain Orientation  Right    Pain Descriptors / Indicators  Dull;Burning;Sharp   sharp with certain movement    Pain Type  Acute pain    Pain Radiating Towards  chest; neck; back; into arm     Pain Onset  In the past 7 days    Pain Frequency  Constant    Aggravating Factors   reaching; dressing; fastening bra    Pain Relieving Factors  injection helped some;  topical; OTC meds         [facility] PT Assessment - 02/11/18 0001      Assessment   Medical Diagnosis  Rt shoulder pain    Referring Provider (PT)  Dr Lynne Leader     Onset Date/Surgical Date  02/06/18    Hand Dominance  Right    Next MD Visit  1/20    Prior Therapy  yes after prior surgeries       Precautions   Precautions  None      Balance Screen   Has the patient fallen in the past 6 months  No    Has the patient had a decrease in activity level because of a fear of falling?   No    Is the patient reluctant to leave their home because of a fear of falling?   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     Leisure  household chores; sedentary -  some computer       Observation/Other Assessments   Focus on Therapeutic Outcomes (FOTO)   47% limitation       Sensation   Additional Comments  no tingling or numbness       Posture/Postural Control   Posture Comments  head forward; shoulders rounded and elevated; head of the humerus anterior in orientation       AROM   Overall AROM Comments  cervical ROM limited end ranges greatest with Lt lateral flexion and rotation     Right/Left Shoulder  --   pain Rt shoulder > w/ IR; flex; abd    Right Shoulder Extension  40 Degrees    Right Shoulder Flexion  88 Degrees    Right Shoulder ABduction  124 Degrees    Right Shoulder Internal Rotation  16 Degrees    Right Shoulder External Rotation  78 Degrees    Left Shoulder Extension  52 Degrees    Left Shoulder Flexion  147 Degrees    Left Shoulder ABduction  159 Degrees    Left Shoulder Internal Rotation  27 Degrees    Left Shoulder External Rotation  97 Degrees      Strength   Overall Strength Comments  moving UE's against gravity; manual muscle testing not done due to pain       Palpation   Spinal mobility  tenderness and tightness through the thoracic  and cervical spine musculature     Palpation comment  significant muscular tightness noted through Rt pecs; upper trap; leveator; biceps; teres; ant/lat/post cervical musculature                 Objective measurements completed on examination: See above findings.      [place] [facility] Adult PT Treatment/Exercise - 02/11/18 0001      Therapeutic Activites    Therapeutic Activities  --   self massage using ~ 4 inch plastic ball      Neuro Re-ed    Neuro Re-ed Details   initiated work on posture and alignment chest up scapular retraction       Shoulder Exercises: Standing   Other Standing Exercises  scapular retraction 10 sec x 5 with noodle       Moist Heat Therapy   Number Minutes Moist Heat  15 Minutes    Moist Heat Location  Shoulder   Rt shoulder girdle     Electrical Stimulation   Electrical Stimulation Location  Rt shoulder girdle     Electrical Stimulation Action  IFC    Electrical Stimulation Parameters  to tolerance    Electrical Stimulation Goals  Pain;Tone      Neck Exercises: Stretches   Other Neck Stretches  axial extension sitting 10 sec x 3; lateral cervical flexion 10 sec x 2 each side; cercvical rotation 10 sec x 2 each side              PT Education - 02/11/18 1617    Education Details  HEP POC TENS    Person(s) Educated  Patient    Methods  Explanation;Demonstration;Tactile cues;Verbal cues;Handout    Comprehension  Verbalized understanding;Returned demonstration;Verbal cues required;Tactile cues required          PT Long Term Goals - 02/11/18 1638      PT LONG TERM GOAL #1   Title  Improve posture and alignment with patient to demonstrate improved upright posture with posterior shoulder girdle engaged 03/25/18    Time  6    Period  Weeks  Status  New      PT LONG TERM GOAL #2   Title  Increased AROM Rt shoulder to =/> AROM Lt shoulder 03/25/18    Time  6    Period  Weeks    Status  New      PT LONG TERM GOAL #3   Title  Decrease  pain Rt shoulder allowing patient to return to normal functional activities with minimal to no pain or limitations 03/25/18    Time  6    Period  Weeks    Status  New      PT LONG TERM GOAL #4   Title  Independent in HEP 03/25/18    Time  6    Period  Weeks    Status  New      PT LONG TERM GOAL #5   Title  Improve FOTO to </= 35% limitation 03/25/18    Time  6    Period  Weeks    Status  New             Plan - 02/11/18 1623    Clinical Impression Statement  Maudie Mercury presents with injury to Rt upper quarter 02/06/18 while lifting a table. She has had some improvement with injection 02/07/18 but pain and limited mobility/function continue. Patient has a history of Rt shoudler surgery x 2 in the past 4 yrs. She has poor posture and alignment; limited ROM/mobility' decreased functioinal strength/ability; muscular tightness to palpation; tightness Rt upper quarter including cervical spine and shoulder girdle. She will benefit from PT to address problems identified.     History and Personal Factors relevant to plan of care:  shd surgeries 2015; 2018     Clinical Presentation  Evolving    Clinical Presentation due to:  desk/computer work     Clinical Decision Making  Low    Rehab Potential  Good    PT Frequency  2x / week    PT Duration  6 weeks    PT Treatment/Interventions  Patient/family education;ADLs/Self Care Home Management;Cryotherapy;Electrical Stimulation;Iontophoresis 4mg /ml Dexamethasone;Moist Heat;Ultrasound;Dry needling;Manual techniques;Neuromuscular re-education;Therapeutic activities;Therapeutic exercise    PT Next Visit Plan  review HEP; trial of movement - pendulum; ?pulley; scap squeeze; manual work Rt upper quarter; assess neural tightness - add exercises as indicated; postural correction; progress with cervical and shoulder rehab as indicated     Consulted and Agree with Plan of Care  Patient       Patient will benefit from skilled therapeutic intervention in order to  improve the following deficits and impairments:  Postural dysfunction, Improper body mechanics, Pain, Impaired sensation, Increased muscle spasms, Hypomobility, Decreased mobility, Decreased range of motion, Decreased strength, Decreased activity tolerance  Visit Diagnosis: Acute pain of right shoulder - Plan: PT plan of care cert/re-cert  Other symptoms and signs involving the musculoskeletal system - Plan: PT plan of care cert/re-cert  Abnormal posture - Plan: PT plan of care cert/re-cert     Problem List Patient Active Problem List   Diagnosis Date Noted  . Lesion of mouth 12/23/2017  . Esophageal dysphagia 12/23/2017  . Mastocytosis 12/23/2017  . Vitiligo 12/23/2017  . Rosacea 12/23/2017  . Hypothyroidism 12/23/2017  . Anxiety and depression 12/23/2017    Seaborn Nakama Nilda Simmer PT, MPH  02/11/2018, 4:44 PM  [facility] [place], [place], [address] Phone: [phone]   Fax:  [phone]  Name: RASHEIDA BRODEN MRN: [id number] Date of Birth: 1965/10/03

## 2018-02-11 NOTE — Patient Instructions (Addendum)
Cool comfort splint for Lt thumb if needed    Axial Extension (Chin Tuck)    Pull chin in and lengthen back of neck. Hold __5__ seconds while counting out loud. Repeat __10__ times. Do __several__ sessions per day.   Side Bend, Sitting    Sit, head in comfortable, centered position, chin slightly tucked. Gently tilt head, bringing ear toward same-side shoulder. Hold _10__ seconds.  Repeat _3-4__ times per session. Do 3-4__ sessions per day.   AROM, Rotation    Sit or stand, head comfortable, centered position. Turn head slowly to look over one shoulder. Hold __10_ seconds. Repeat to other side. Repeat __3-4_ times per session. Do _3-4__ sessions per day.  Shoulder Blade Squeeze    Rotate shoulders back, then squeeze shoulder blades down and back Hold 5-10 Repeat __3-5__ times. Do _several ___ sessions per day.  Self massage using ~ 4 inch plastic ball.   TENS UNIT: This is helpful for muscle pain and spasm.   Search and Purchase a TENS 7000 2nd edition at www.tenspros.com. It should be less than $30.     TENS unit instructions: Do not shower or bathe with the unit on Turn the unit off before removing electrodes or batteries If the electrodes lose stickiness add a drop of water to the electrodes after they are disconnected from the unit and place on plastic sheet. If you continued to have difficulty, call the TENS unit company to purchase more electrodes. Do not apply lotion on the skin area prior to use. Make sure the skin is clean and dry as this will help prolong the life of the electrodes. After use, always check skin for unusual red areas, rash or other skin difficulties. If there are any skin problems, does not apply electrodes to the same area. Never remove the electrodes from the unit by pulling the wires. Do not use the TENS unit or electrodes other than as directed. Do not change electrode placement without consultating your therapist or physician. Keep 2  fingers with between each electrode.     [facility] [place], [place] [address]  [phone] (office) [phone] (fax)

## 2018-02-16 ENCOUNTER — Encounter: Payer: Self-pay | Admitting: Osteopathic Medicine

## 2018-02-17 ENCOUNTER — Ambulatory Visit (INDEPENDENT_AMBULATORY_CARE_PROVIDER_SITE_OTHER): Payer: No Typology Code available for payment source

## 2018-02-17 ENCOUNTER — Encounter: Payer: Self-pay | Admitting: Family Medicine

## 2018-02-17 ENCOUNTER — Ambulatory Visit (INDEPENDENT_AMBULATORY_CARE_PROVIDER_SITE_OTHER): Payer: No Typology Code available for payment source | Admitting: Family Medicine

## 2018-02-17 VITALS — BP 120/66 | HR 71 | Ht 67.0 in | Wt 168.0 lb

## 2018-02-17 DIAGNOSIS — M25511 Pain in right shoulder: Secondary | ICD-10-CM

## 2018-02-17 MED ORDER — NAPROXEN 500 MG PO TABS: 500.0000 mg | ORAL_TABLET | Freq: Two times a day (BID) | ORAL | 0 refills | Status: DC | PRN

## 2018-02-17 MED FILL — NAPROXEN 500 MG TABLET: 500 | 30 days supply | Qty: 60 | Fill #0

## 2018-02-17 NOTE — Progress Notes (Signed)
Kayla Lindsey is a 52 y.o. female who presents to [facility] today for worsening right shoulder pain. Kayla Lindsey went to one PT appointment and has been using a TENS unit since her appointment on 12/02. She notes that the steroid injection alleviated her pain only on 12/02, but has not helped since then. On Tuesday (12/10), she was reaching for something at her desk and felt a pop. Pain immediately radiated up her arm and up to her ear. Since then, she has felt like there is a catch in her shoulder. She demonstrates that the pain is worst with abduction. The pain is constant in her shoulder and is currently radiating down into her wrist. She notes that it hurts to wipe her kitchen counters, turn off her lamp, or fix her hair. She denies numbness and tingling.    ROS:  As above  Exam:  BP 120/66   Pulse 71   Ht 5\' 7"  (1.702 m)   Wt 168 lb (76.2 kg)   BMI 26.31 kg/m  General: Well Developed, well nourished, and in no acute distress.  Neuro/Psych: Alert and oriented x3, extra-ocular muscles intact, able to move all 4 extremities, sensation grossly intact. Skin: Warm and dry, no rashes noted.  Respiratory: Not using accessory muscles, speaking in full sentences, trachea midline.  Cardiovascular: Pulses palpable, no extremity edema. Abdomen: Does not appear distended. MSK:  Right shoulder: Exam limited due to pain. ROM limited by pain with abduction.  Normal external rotation. Strength intact with resisted supination.  Hawkins and Neers tests not performed due to pain and guarding.   Left shoulder: normal-appearing, no tenderness to palpation, normal ROM, normal strength. Pulses and capillary refill normal.  Lab and Radiology Results X-ray images right shoulder personally independently reviewed. No significant glenohumeral DJD.  Post decompression surgical changes present.  Post biceps tenodesis change present in proximal humerus.  No  acute fractures or severe degenerative changes.  Limited musculoskeletal ultrasound right shoulder reveals intact appearing supraspinatus infraspinatus tendons.  No full-thickness tears with retraction. Normal bony parents.  Assessment and Plan: 52 y.o. female with  Right shoulder pain: Kayla Lindsey has experienced worsening right shoulder pain after she felt a sudden pop in her shoulder on Tuesday (12/10). She has severe pain with abduction and her normal daily activities have been limited due to the pain. On exam, she experiences pain with abduction. Right shoulder x-ray did not reveal any fractures or signs of avulsion or calcific tendonitis. Right shoulder ultrasound revealed intact rotator cuff tendons without sign of tear. Advised Kayla Lindsey to continue with gentle PT. Prescribed naproxen twice daily for pain. She can use a sling as needed. Follow-up in a few weeks. Will consider MRI as next step if not improved with conservative management.     Orders Placed This Encounter  Procedures  . DG Shoulder Right    Standing Status:   Future    Number of Occurrences:   1    Standing Expiration Date:   04/21/2019    Order Specific Question:   Reason for Exam (SYMPTOM  OR DIAGNOSIS REQUIRED)    Answer:   eval right shoulder pain    Order Specific Question:   Is patient pregnant?    Answer:   No    Order Specific Question:   Preferred imaging location?    Answer:   [facility]    Order Specific Question:   Radiology Contrast Protocol - do NOT remove file path  Answer:   [email or web address]   Meds ordered this encounter  Medications  . naproxen (NAPROSYN) 500 MG tablet    Sig: Take 1 tablet (500 mg total) by mouth 2 (two) times daily as needed.    Dispense:  60 tablet    Refill:  0    Historical information moved to improve visibility of documentation.  Past Medical History:  Diagnosis Date  . Anxiety   . Depression   . Hashimoto's  thyroiditis   . Mastocytosis    Past Surgical History:  Procedure Laterality Date  . APPENDECTOMY    . BREAST BIOPSY Bilateral   . BREAST LUMPECTOMY Right   . carpel tunnel    . CHOLECYSTECTOMY    . HIP SURGERY    . SHOULDER ACROMIOPLASTY     Social History   Tobacco Use  . Smoking status: Never Smoker  . Smokeless tobacco: Never Used  Substance Use Topics  . Alcohol use: Not Currently    Frequency: Never   family history includes COPD in her father and mother; Cancer in her mother; Esophageal cancer in her mother; Heart disease in her mother; Thyroid disease in her mother.  Medications: Current Outpatient Medications  Medication Sig Dispense Refill  . Azelaic Acid 15 % cream Apply 1 application topically 2 (two) times daily. After skin is thoroughly washed and patted dry 50 g 11  . buPROPion (WELLBUTRIN XL) 150 MG 24 hr tablet Take 1 tablet (150 mg total) by mouth daily. 30 tablet 1  . cetirizine (ZYRTEC) 10 MG tablet Take 1 tablet (10 mg total) by mouth daily. 90 tablet 3  . diclofenac sodium (VOLTAREN) 1 % GEL Apply 2 g topically 4 (four) times daily. To affected joint. 100 g 11  . docusate sodium (COLACE) 100 MG capsule Take 1 capsule (100 mg total) by mouth 3 (three) times daily as needed. 30 capsule 0  . escitalopram (LEXAPRO) 10 MG tablet Take 1 tablet (10 mg total) by mouth daily. Evenings preferred. Take as directed 90 tablet 0  . hydrocortisone (ANUSOL-HC) 25 MG suppository Place 1 suppository (25 mg total) rectally 2 (two) times daily as needed for hemorrhoids. 12 suppository 2  . levothyroxine (SYNTHROID, LEVOTHROID) 125 MCG tablet Take 1 tablet (125 mcg total) by mouth daily before breakfast. 90 tablet 3  . lidocaine (LMX) 4 % cream Apply 1 application topically as needed. 30 g 0  . lidocaine (XYLOCAINE) 2 % jelly Apply 1 application topically as needed. Every 2-3 hours 8 mL 1  . Sulfacetamide in Bakuchiol (SODIUM SULFACETAMIDE WASH) 10 % LIQD Apply to face topically  twice a day as face wash. 1 Bottle 1  . venlafaxine XR (EFFEXOR-XR) 37.5 MG 24 hr capsule Take 1 capsule (37.5 mg total) by mouth daily with breakfast. As directed 30 capsule 0  . venlafaxine XR (EFFEXOR-XR) 75 MG 24 hr capsule Take 3 capsules (225 mg total) by mouth daily with breakfast. 270 capsule 1  . naproxen (NAPROSYN) 500 MG tablet Take 1 tablet (500 mg total) by mouth 2 (two) times daily as needed. 60 tablet 0   No current facility-administered medications for this visit.    No Known Allergies    Discussed warning signs or symptoms. Please see discharge instructions. Patient expresses understanding.  I personally was present and performed or re-performed the history, physical exam and medical decision-making activities of this service and have verified that the service and findings are accurately documented in the student's note. ___________________________________________ Lynne Leader M.D., ABFM., CAQSM.  Primary Care and Sports Medicine Adjunct Instructor of [employer]

## 2018-02-17 NOTE — Patient Instructions (Signed)
Thank you for coming in today. I think you do not have a severe tear.  OK to continue conservative management.  STOP ibuprofen.  Take naproxen twice daily as needed for pain,  OK to use a sling for a little while as needed.   Continue gentle PT.  Let me know next week if miserable.

## 2018-02-21 ENCOUNTER — Encounter: Payer: Self-pay | Admitting: Osteopathic Medicine

## 2018-02-21 ENCOUNTER — Encounter: Payer: Self-pay | Admitting: Rehabilitative and Restorative Service Providers"

## 2018-02-21 ENCOUNTER — Ambulatory Visit (INDEPENDENT_AMBULATORY_CARE_PROVIDER_SITE_OTHER): Payer: No Typology Code available for payment source | Admitting: Rehabilitative and Restorative Service Providers"

## 2018-02-21 ENCOUNTER — Ambulatory Visit (INDEPENDENT_AMBULATORY_CARE_PROVIDER_SITE_OTHER): Payer: No Typology Code available for payment source | Admitting: Osteopathic Medicine

## 2018-02-21 VITALS — BP 137/81 | HR 85 | Temp 98.5°F | Wt 168.0 lb

## 2018-02-21 DIAGNOSIS — M25511 Pain in right shoulder: Secondary | ICD-10-CM | POA: Diagnosis not present

## 2018-02-21 DIAGNOSIS — F329 Major depressive disorder, single episode, unspecified: Secondary | ICD-10-CM | POA: Diagnosis not present

## 2018-02-21 DIAGNOSIS — F419 Anxiety disorder, unspecified: Secondary | ICD-10-CM

## 2018-02-21 DIAGNOSIS — R29898 Other symptoms and signs involving the musculoskeletal system: Secondary | ICD-10-CM

## 2018-02-21 DIAGNOSIS — E039 Hypothyroidism, unspecified: Secondary | ICD-10-CM | POA: Diagnosis not present

## 2018-02-21 DIAGNOSIS — R293 Abnormal posture: Secondary | ICD-10-CM | POA: Diagnosis not present

## 2018-02-21 DIAGNOSIS — K644 Residual hemorrhoidal skin tags: Secondary | ICD-10-CM | POA: Diagnosis not present

## 2018-02-21 MED ORDER — VENLAFAXINE HCL ER 150 MG PO CP24: 150.0000 mg | ORAL_CAPSULE | Freq: Every day | ORAL | 1 refills | Status: AC

## 2018-02-21 MED ORDER — LEVOTHYROXINE SODIUM 125 MCG PO TABS: 125.0000 ug | ORAL_TABLET | Freq: Every day | ORAL | 3 refills | Status: DC

## 2018-02-21 MED FILL — VENLAFAXINE HCL ER 150 MG C: 150 | 90 days supply | Qty: 90 | Fill #0

## 2018-02-21 NOTE — Patient Instructions (Signed)

## 2018-02-21 NOTE — Patient Instructions (Addendum)
Will refill thyroid meds and Effexor 150 mg If you want to switch meds, call me!  Referral to GI

## 2018-02-21 NOTE — Progress Notes (Signed)
HPI: Kayla Lindsey is a 52 y.o. female who  has a past medical history of Anxiety, Depression, Hashimoto's thyroiditis, and Mastocytosis.  she presents to [facility] today, 02/21/18,  for chief complaint of:  Recheck moods   Increased stress w/ moving  Was ok for awhile on Effexor, noted withdrawal sx when skippsed dose, we've reduced dose w/ plan to switch to Lexapro but she'd rather just stay on Effexor.   We added Wellbutrin to Effexor Wellbutrin caused irritability Switched Effexor to Lexapro Lexapro caused worsening anxiety        At today's visit... Past medical history, surgical history, and family history reviewed and updated as needed.  Current medication list and allergy/intolerance information reviewed and updated as needed. (See remainder of HPI, ROS, Phys Exam below)   No results found.  No results found for this or any previous visit (from the past 72 hour(s)).        ASSESSMENT/PLAN: The primary encounter diagnosis was Anxiety and depression. Diagnoses of Hypothyroidism, unspecified type and Hemorrhoidal skin tag were also pertinent to this visit.  Orders Placed This Encounter  Procedures  . Ambulatory referral to Gastroenterology     Meds ordered this encounter  Medications  . levothyroxine (SYNTHROID, LEVOTHROID) 125 MCG tablet    Sig: Take 1 tablet (125 mcg total) by mouth daily before breakfast.    Dispense:  90 tablet    Refill:  3  . venlafaxine XR (EFFEXOR-XR) 150 MG 24 hr capsule    Sig: Take 1 capsule (150 mg total) by mouth daily with breakfast.    Dispense:  90 capsule    Refill:  1    Patient Instructions  Will refill thyroid meds and Effexor 150 mg If you want to switch meds, call me!  Referral to GI       Follow-up plan: Return in about 6 months (around 08/23/2018) for annual check-up before you leave [place] [place] if needed  .                             ############################################ ############################################ ############################################ ############################################    Current Meds  Medication Sig  . Azelaic Acid 15 % cream Apply 1 application topically 2 (two) times daily. After skin is thoroughly washed and patted dry  . docusate sodium (COLACE) 100 MG capsule Take 1 capsule (100 mg total) by mouth 3 (three) times daily as needed.  . hydrocortisone (ANUSOL-HC) 25 MG suppository Place 1 suppository (25 mg total) rectally 2 (two) times daily as needed for hemorrhoids.  Marland Kitchen levothyroxine (SYNTHROID, LEVOTHROID) 125 MCG tablet Take 1 tablet (125 mcg total) by mouth daily before breakfast.  . naproxen (NAPROSYN) 500 MG tablet Take 1 tablet (500 mg total) by mouth 2 (two) times daily as needed.  . Sulfacetamide in Bakuchiol (SODIUM SULFACETAMIDE WASH) 10 % LIQD Apply to face topically twice a day as face wash.  . venlafaxine XR (EFFEXOR-XR) 37.5 MG 24 hr capsule Take 1 capsule (37.5 mg total) by mouth daily with breakfast. As directed  . venlafaxine XR (EFFEXOR-XR) 75 MG 24 hr capsule Take 3 capsules (225 mg total) by mouth daily with breakfast.    No Known Allergies     Review of Systems:  Constitutional: No recent illness  HEENT: No  headache, no vision change  Cardiac: No  chest pain, No  pressure, No palpitations  Respiratory:  No  shortness of breath. No  Cough  Gastrointestinal: No  abdominal pain, +hemorrhoidal skin tag she'd like removed  Musculoskeletal: No new myalgia/arthralgia  Neurologic: No  weakness, No  Dizziness  Psychiatric: No  concerns with depression, +concerns with anxiety  Exam:  BP 137/81 (BP Location: Left Arm, Patient Position: Sitting, Cuff Size: Normal)   Pulse 85   Temp 98.5 F (36.9 C) (Oral)   Wt 168 lb (76.2 kg)   BMI 26.31 kg/m   Constitutional: VS see above. General  Appearance: alert, well-developed, well-nourished, NAD  Eyes: Normal lids and conjunctive, non-icteric sclera  Ears, Nose, Mouth, Throat: MMM, Normal external inspection ears/nares/mouth/lips/gums.  Neck: No masses, trachea midline.   Respiratory: Normal respiratory effort.  Musculoskeletal: Gait normal. Symmetric and independent movement of all extremities  Neurological: Normal balance/coordination. No tremor.  Skin: warm, dry, intact.   Psychiatric: Normal judgment/insight. Normal mood and affect. Oriented x3.       Visit summary with medication list and pertinent instructions was printed for patient to review, patient was advised to alert [place] if any updates are needed. All questions at time of visit were answered - patient instructed to contact office with any additional concerns. ER/RTC precautions were reviewed with the patient and understanding verbalized.   Note: Total time spent 15 minutes, greater than 50% of the visit was spent face-to-face counseling and coordinating care for the following: The primary encounter diagnosis was Anxiety and depression. Diagnoses of Hypothyroidism, unspecified type and Hemorrhoidal skin tag were also pertinent to this visit.Marland Kitchen  Please note: voice recognition software was used to produce this document, and typos may escape review. Please contact Dr. Sheppard Coil for any needed clarifications.    Follow up plan: Return in about 6 months (around 08/23/2018) for annual check-up before you leave [place] if needed .

## 2018-02-21 NOTE — Therapy (Signed)
[place], [place], [address] Phone: [phone]   Fax:  [phone]  Physical Therapy Treatment  Patient Details  Name: Kayla Lindsey MRN: [id number] Date of Birth: [birth year] Referring Provider (PT): Dr Lynne Leader    Encounter Date: 02/21/2018  PT End of Session - 02/21/18 1659    Visit Number  2    Number of Visits  12    Date for PT Re-Evaluation  03/25/18    PT Start Time  0350    PT Stop Time  1600    PT Time Calculation (min)  45 min    Activity Tolerance  Patient tolerated treatment well       Past Medical History:  Diagnosis Date  . Anxiety   . Depression   . Hashimoto's thyroiditis   . Mastocytosis     Past Surgical History:  Procedure Laterality Date  . APPENDECTOMY    . BREAST BIOPSY Bilateral   . BREAST LUMPECTOMY Right   . carpel tunnel    . CHOLECYSTECTOMY    . HIP SURGERY    . SHOULDER ACROMIOPLASTY      There were no vitals filed for this visit.  Subjective Assessment - 02/21/18 1552    Subjective  Reinjury Rt shoulder last week. Seen by Dr Georgina Snell with US showing some fluid in the anterior Rt shoulder and maybe a partial tear of RC - wants to wait a couple of weeks for MRI - OK to continue PT. Patient has done some of the exercises which "feel good'. She has tried to work on her posture and alignment expecially at work          [facility] PT Assessment - 02/21/18 0001      Assessment   Medical Diagnosis  Rt shoulder pain    Referring Provider (PT)  Dr Lynne Leader     Onset Date/Surgical Date  02/06/18    Hand Dominance  Right    Prior Therapy  yes after prior surgeries       AROM   Overall AROM Comments  painful arc with Rt shoulder flexion and lowering from flexion       Palpation   Palpation comment  significant muscular tightness noted through Rt pecs; upper trap; leveator; biceps; teres; ant/lat/post cervical musculature                     [facility] Adult PT Treatment/Exercise - 02/21/18 0001      Shoulder Exercises: Pulleys   Flexion  --   3-5 reps - painful arc with return from elevation 120-80 deg     Moist Heat Therapy   Number Minutes Moist Heat  10 Minutes    Moist Heat Location  Shoulder   Rt shoulder girdle     Electrical Stimulation   Electrical Stimulation Location  Rt shoulder girdle     Electrical Stimulation Action  IFC    Electrical Stimulation Parameters  to tolerance    Electrical Stimulation Goals  Pain;Tone      Iontophoresis   Type of Iontophoresis  Dexamethasone    Location  anterior Rt shoulder     Dose  120 mAmp    Time  12 hr       Manual Therapy   Manual therapy comments  pt supine     Joint Mobilization  circumduction Rt GH joint     Soft tissue mobilization  deep tissue work Rt shd girdle -  working through [employer]; upper trap; teres; anterior deltoid; biceps     Myofascial Release  pecs    Scapular Mobilization  lateral glide       Neck Exercises: Stretches   Other Neck Stretches  axial extension sitting 10 sec x 3; lateral cervical flexion 10 sec x 2 each side; cercvical rotation 10 sec x 2 each side              PT Education - 02/21/18 1544    Education Details  ionto     Person(s) Educated  Patient    Methods  Explanation    Comprehension  Verbalized understanding          PT Long Term Goals - 02/11/18 1638      PT LONG TERM GOAL #1   Title  Improve posture and alignment with patient to demonstrate improved upright posture with posterior shoulder girdle engaged 03/25/18    Time  6    Period  Weeks    Status  New      PT LONG TERM GOAL #2   Title  Increased AROM Rt shoulder to =/> AROM Lt shoulder 03/25/18    Time  6    Period  Weeks    Status  New      PT LONG TERM GOAL #3   Title  Decrease pain Rt shoulder allowing patient to return to normal functional activities with minimal to no pain or limitations 03/25/18    Time  6    Period   Weeks    Status  New      PT LONG TERM GOAL #4   Title  Independent in HEP 03/25/18    Time  6    Period  Weeks    Status  New      PT LONG TERM GOAL #5   Title  Improve FOTO to </= 35% limitation 03/25/18    Time  6    Period  Weeks    Status  New            Plan - 02/21/18 1710    Clinical Impression Statement  Maudie Mercury reports reinjury to Rt shoulder last week. She was seen by Dr Georgina Snell with US showing fluid and possibly partial tendon tear. She will continue PT and return to MD in a couple of weeks. - Has had Rt shoulder surgery x 2 in the past 4 yrs. Responded welll to manual work and modalities. Painful with efforts to add exercise including pulley.     Rehab Potential  Good    PT Frequency  2x / week    PT Duration  6 weeks    PT Treatment/Interventions  Patient/family education;ADLs/Self Care Home Management;Cryotherapy;Electrical Stimulation;Iontophoresis 4mg /ml Dexamethasone;Moist Heat;Ultrasound;Dry needling;Manual techniques;Neuromuscular re-education;Therapeutic activities;Therapeutic exercise    PT Next Visit Plan  review HEP; trial of movement - pendulum; ?pulley; scap squeeze; manual work Rt upper quarter; assess neural tightness - add exercises as indicated; postural correction; progress with cervical and shoulder rehab as indicated     Consulted and Agree with Plan of Care  Patient       Patient will benefit from skilled therapeutic intervention in order to improve the following deficits and impairments:  Postural dysfunction, Improper body mechanics, Pain, Impaired sensation, Increased muscle spasms, Hypomobility, Decreased mobility, Decreased range of motion, Decreased strength, Decreased activity tolerance  Visit Diagnosis: Acute pain of right shoulder  Other symptoms and signs involving the musculoskeletal system  Abnormal posture  Problem List Patient Active Problem List   Diagnosis Date Noted  . Lesion of mouth 12/23/2017  . Esophageal dysphagia  12/23/2017  . Mastocytosis 12/23/2017  . Vitiligo 12/23/2017  . Rosacea 12/23/2017  . Hypothyroidism 12/23/2017  . Anxiety and depression 12/23/2017    Chasitee Zenker Nilda Simmer PT, MPH  02/21/2018, 5:17 PM  [facility] [place], [place], [address] Phone: [phone]   Fax:  [phone]  Name: TASHAWNDA BLEILER MRN: [id number] Date of Birth: November 24, 1965

## 2018-02-22 ENCOUNTER — Encounter: Payer: Self-pay | Admitting: Osteopathic Medicine

## 2018-02-22 ENCOUNTER — Encounter: Payer: Self-pay | Admitting: Gastroenterology

## 2018-02-24 ENCOUNTER — Ambulatory Visit: Payer: No Typology Code available for payment source | Admitting: Physical Therapy

## 2018-02-24 DIAGNOSIS — M25511 Pain in right shoulder: Secondary | ICD-10-CM | POA: Diagnosis not present

## 2018-02-24 DIAGNOSIS — R293 Abnormal posture: Secondary | ICD-10-CM | POA: Diagnosis not present

## 2018-02-24 DIAGNOSIS — R29898 Other symptoms and signs involving the musculoskeletal system: Secondary | ICD-10-CM

## 2018-02-24 NOTE — Therapy (Addendum)
[place], [place], [address] Phone: [phone]   Fax:  [phone]  Physical Therapy Treatment  Patient Details  Name: Kayla Lindsey MRN: [id number] Date of Birth: 06/09/1965 Referring Provider (PT): Dr Lynne Leader    Encounter Date: 02/24/2018  PT End of Session - 02/24/18 1534    Visit Number  3    Number of Visits  12    Date for PT Re-Evaluation  03/25/18    PT Start Time  5852    PT Stop Time  1612    PT Time Calculation (min)  38 min    Activity Tolerance  Patient tolerated treatment well;No increased pain    Behavior During Therapy  WFL for tasks assessed/performed       Past Medical History:  Diagnosis Date  . Anxiety   . Depression   . Hashimoto's thyroiditis   . Mastocytosis     Past Surgical History:  Procedure Laterality Date  . APPENDECTOMY    . BREAST BIOPSY Bilateral   . BREAST LUMPECTOMY Right   . carpel tunnel    . CHOLECYSTECTOMY    . HIP SURGERY    . SHOULDER ACROMIOPLASTY      There were no vitals filed for this visit.  Subjective Assessment - 02/24/18 1534    Subjective  Pt reports her shoulder is feeling much better than the first visit.  She is also taking Naproxen, which has helped her sleep. She tolerated ionto patch well.  She's having pain with using her mouse at work, (arm abdct)   Patient Stated Goals  learn how to care for shoulder; increase ROM; stop pain     Currently in Pain?  No/denies    Pain Score  0-No pain         [facility] PT Assessment - 02/24/18 0001      Assessment   Medical Diagnosis  Rt shoulder pain    Referring Provider (PT)  Dr Lynne Leader     Onset Date/Surgical Date  02/06/18    Hand Dominance  Right    Prior Therapy  yes after prior surgeries        [facility] Adult PT Treatment/Exercise - 02/24/18 0001      Exercises   Exercises  Shoulder      Shoulder Exercises: Seated   Other Seated Exercises  shoulder rolls x 10        Shoulder Exercises: Standing   Other Standing Exercises  scapular retraction 10 sec x 5 with noodle; L's and W's x 10 each for 5 seconds.       Shoulder Exercises: Stretch   Other Shoulder Stretches  midlevel doorway stretch with elbow straight x 20 sec x 2 reps each arm.   Rt bicep stretch holding counter x 30 sec x 2 reps       Modalities   Modalities  --   pt declined heat and estim     Iontophoresis   Type of Iontophoresis  Dexamethasone    Location  anterior Rt shoulder     Dose  120 mAmp    Time  12 hr       Manual Therapy   Manual Therapy  Soft tissue mobilization;Joint mobilization;Taping;Myofascial release    Manual therapy comments  pt supine and sitting     Joint Mobilization  circumduction Rt GH joint     Soft tissue mobilization  STM to Rt pec, anterior shoulder.   IASTM to  post Rt shoulder including teres maj, infraspinatus, post delt - to decrease fascial tightness and improve ROM.     Myofascial Release  Rt pec major and minor    Kinesiotex  Create Space      Kinesiotix   Create Space  I strip of sensitive skin tape to Rt posterior shoulder (teres minor) to decompress tissue and increase proprioception.         PT Long Term Goals - 02/11/18 1638      PT LONG TERM GOAL #1   Title  Improve posture and alignment with patient to demonstrate improved upright posture with posterior shoulder girdle engaged 03/25/18    Time  6    Period  Weeks    Status  New      PT LONG TERM GOAL #2   Title  Increased AROM Rt shoulder to =/> AROM Lt shoulder 03/25/18    Time  6    Period  Weeks    Status  New      PT LONG TERM GOAL #3   Title  Decrease pain Rt shoulder allowing patient to return to normal functional activities with minimal to no pain or limitations 03/25/18    Time  6    Period  Weeks    Status  New      PT LONG TERM GOAL #4   Title  Independent in HEP 03/25/18    Time  6    Period  Weeks    Status  New      PT LONG TERM GOAL #5   Title  Improve  FOTO to </= 35% limitation 03/25/18    Time  6    Period  Weeks    Status  New            Plan - 02/24/18 1643    Clinical Impression Statement  Pt continues to have pain in Rt shoulder with active abduction, although with improved scapular positioning pain is less.  She is unable to tolerate a doorway stretch, but can do pec stretch with straight elbow (lower position on door).  Goals are ongoing.     Rehab Potential  Good    PT Frequency  2x / week    PT Duration  6 weeks    PT Treatment/Interventions  Patient/family education;ADLs/Self Care Home Management;Cryotherapy;Electrical Stimulation;Iontophoresis 4mg/ml Dexamethasone;Moist Heat;Ultrasound;Dry needling;Manual techniques;Neuromuscular re-education;Therapeutic activities;Therapeutic exercise    PT Next Visit Plan  continue postural correction, manual therapy and ionto.     Consulted and Agree with Plan of Care  Patient       Patient will benefit from skilled therapeutic intervention in order to improve the following deficits and impairments:  Postural dysfunction, Improper body mechanics, Pain, Impaired sensation, Increased muscle spasms, Hypomobility, Decreased mobility, Decreased range of motion, Decreased strength, Decreased activity tolerance  Visit Diagnosis: Acute pain of right shoulder  Other symptoms and signs involving the musculoskeletal system  Abnormal posture     Problem List Patient Active Problem List   Diagnosis Date Noted  . Lesion of mouth 12/23/2017  . Esophageal dysphagia 12/23/2017  . Mastocytosis 12/23/2017  . Vitiligo 12/23/2017  . Rosacea 12/23/2017  . Hypothyroidism 12/23/2017  . Anxiety and depression 12/23/2017    Carlson-Long, PTA 02/24/18 4:48 PM  [facility] [address] [place], , [address] Phone: [phone]   Fax:  [phone]  Name: Kayla Lindsey MRN: [id number] Date of Birth:  06/04/1965  PHYSICAL THERAPY DISCHARGE SUMMARY    Visits from Start of Care: 3  Current functional level related to goals / functional outcomes: See last progress note for discharge status    Remaining deficits: Continued pain    Education / Equipment: initial HEP  Plan: Patient agrees to discharge.  Patient goals were not met. Patient is being discharged due to a change in medical status.  ?????    Patient scheduled for surgery   Celyn P. Helene Kelp PT, MPH 04/06/18 4:41 PM

## 2018-02-28 ENCOUNTER — Encounter: Payer: No Typology Code available for payment source | Admitting: Rehabilitative and Restorative Service Providers"

## 2018-03-07 ENCOUNTER — Encounter: Payer: No Typology Code available for payment source | Admitting: Physical Therapy

## 2018-03-11 ENCOUNTER — Encounter: Payer: No Typology Code available for payment source | Admitting: Physical Therapy

## 2018-03-11 ENCOUNTER — Telehealth: Payer: Self-pay | Admitting: Physical Therapy

## 2018-03-11 NOTE — Telephone Encounter (Signed)
Patient did not show for physical therapy appointment.  Attempted to call pt regarding missed appt and inquire about current status.  No answer and generic voice mail message; left message from [facility] and left number to return call [phone].   Kerin Perna, PTA 03/11/18 3:50 PM

## 2018-03-15 ENCOUNTER — Ambulatory Visit (INDEPENDENT_AMBULATORY_CARE_PROVIDER_SITE_OTHER): Payer: 59 | Admitting: Family Medicine

## 2018-03-15 DIAGNOSIS — M25511 Pain in right shoulder: Secondary | ICD-10-CM

## 2018-03-15 NOTE — Patient Instructions (Signed)
Thank you for coming in today.  You should hear about the shoulder MRI soon.  Let me know if you do not hear anything.

## 2018-03-16 ENCOUNTER — Encounter: Payer: Self-pay | Admitting: Family Medicine

## 2018-03-16 ENCOUNTER — Ambulatory Visit: Payer: No Typology Code available for payment source | Admitting: Gastroenterology

## 2018-03-16 NOTE — Progress Notes (Signed)
Kayla Lindsey is a 53 y.o. female who presents to [facility] today for right shoulder pain.  Kayla Lindsey has been seen several times now for right shoulder pain.  She last was seen on December 2 and 12.  She is a pertinent surgical history for subacromial decompression and proximal biceps tenodesis performed at [facility] and in [place] respectively.  Fortunately she has had moderate improvement in pain with physical therapy.  However she does continue to experience popping catching and pain in her shoulder.  She notes with overhead motion and external rotated position her shoulder will pop loudly and sometimes will lock in that position.  She notes this is sometimes painful.  She has this interferes with her ability to exercise normally and complete tasks around the house such as grabbing objects off of high shelves or cleaning.  She rates her symptoms as moderate.    ROS:  As above  Exam:  Hr 80 bpm Wt Readings from Last 5 Encounters:  02/21/18 168 lb (76.2 kg)  02/17/18 168 lb (76.2 kg)  02/07/18 167 lb (75.8 kg)  02/01/18 172 lb 14.4 oz (78.4 kg)  01/20/18 168 lb 14.4 oz (76.6 kg)   General: Well Developed, well nourished, and in no acute distress.  Neuro/Psych: Alert and oriented x3, extra-ocular muscles intact, able to move all 4 extremities, sensation grossly intact. Skin: Warm and dry, no rashes noted.  Respiratory: Not using accessory muscles, speaking in full sentences, trachea midline.  Cardiovascular: Pulses palpable, no extremity edema. Abdomen: Does not appear distended. MSK: C-spine nontender to midline normal neck motion. Right shoulder relatively normal-appearing nontender.  Normal range of motion however palpable pop present with abducted external rotated position. Patient guards with the external rotated position with anterior apprehension test being positive. Strength however is intact. Positive Hawkins and Neer's  test. Positive empty can test and O'Brien's test.   Left shoulder nontender normal motion with strength.  Pulses cap refill and sensation are intact distally.    Lab and Radiology Results EXAM: RIGHT SHOULDER - 2+ VIEW  COMPARISON:  None.  FINDINGS: No acute fracture. No dislocation. Unremarkable soft tissues. Mild degenerative change.  IMPRESSION: No acute bony pathology   Electronically Signed   By: Kayla Lindsey M.D.   On: 02/17/2018 10:20 I personally (independently) visualized and performed the interpretation of the images attached in this note.     Assessment and Plan: 53 y.o. female with right shoulder pain with mechanical symptoms including popping and locking.  This is in the setting of an acute injury around December 1.  She has had a trial of physical therapy and has had some improvement but continues to have significant mechanical symptoms that are affecting her quality of life and her ability to exercise normally complete tasks around the house.  At this point I think her next best option is MRI arthrogram to evaluate for labrum tear or rotator cuff tear.  She notes that she would be willing to undergo surgery if that would relieve her symptoms.  Plan to proceed with MRI and refer to orthopedic surgery if warranted.  I spent 25 minutes with this patient, greater than 50% was face-to-face time counseling regarding differential diagnosis treatment plan and options.Marland Kitchen   PDMP not reviewed this encounter. Orders Placed This Encounter  Procedures  . MR SHOULDER RIGHT W CONTRAST    Standing Status:   Future    Standing Expiration Date:   05/14/2019    Order Specific  Question:   If indicated for the ordered procedure, I authorize the administration of contrast media per Radiology protocol    Answer:   Yes    Order Specific Question:   What is the patient's sedation requirement?    Answer:   No Sedation    Order Specific Question:   Does the patient have a  pacemaker or implanted devices?    Answer:   No    Order Specific Question:   Radiology Contrast Protocol - do NOT remove file path    Answer:   [email or web address]    Order Specific Question:   Preferred imaging location?    Answer:   Product/process development scientist (table limit-350lbs)   No orders of the defined types were placed in this encounter.   Historical information moved to improve visibility of documentation.  Past Medical History:  Diagnosis Date  . Anxiety   . Depression   . Hashimoto's thyroiditis   . Mastocytosis    Past Surgical History:  Procedure Laterality Date  . APPENDECTOMY    . BREAST BIOPSY Bilateral   . BREAST LUMPECTOMY Right   . carpel tunnel    . CHOLECYSTECTOMY    . HIP SURGERY    . SHOULDER ACROMIOPLASTY     Social History   Tobacco Use  . Smoking status: Never Smoker  . Smokeless tobacco: Never Used  Substance Use Topics  . Alcohol use: Not Currently    Frequency: Never   family history includes COPD in her father and mother; Cancer in her mother; Esophageal cancer in her mother; Heart disease in her mother; Thyroid disease in her mother.  Medications: Current Outpatient Medications  Medication Sig Dispense Refill  . Azelaic Acid 15 % cream Apply 1 application topically 2 (two) times daily. After skin is thoroughly washed and patted dry 50 g 11  . docusate sodium (COLACE) 100 MG capsule Take 1 capsule (100 mg total) by mouth 3 (three) times daily as needed. 30 capsule 0  . levothyroxine (SYNTHROID, LEVOTHROID) 125 MCG tablet Take 1 tablet (125 mcg total) by mouth daily before breakfast. 90 tablet 3  . naproxen (NAPROSYN) 500 MG tablet Take 1 tablet (500 mg total) by mouth 2 (two) times daily as needed. 60 tablet 0  . Sulfacetamide in Bakuchiol (SODIUM SULFACETAMIDE WASH) 10 % LIQD Apply to face topically twice a day as face wash. 1 Bottle 1  . venlafaxine XR (EFFEXOR-XR) 150 MG 24 hr capsule Take 1 capsule (150 mg total) by  mouth daily with breakfast. 90 capsule 1   No current facility-administered medications for this visit.    No Known Allergies    Discussed warning signs or symptoms. Please see discharge instructions. Patient expresses understanding.

## 2018-03-17 ENCOUNTER — Encounter: Payer: Self-pay | Admitting: Osteopathic Medicine

## 2018-03-21 ENCOUNTER — Ambulatory Visit (INDEPENDENT_AMBULATORY_CARE_PROVIDER_SITE_OTHER): Payer: 59

## 2018-03-21 ENCOUNTER — Encounter: Payer: Self-pay | Admitting: Family Medicine

## 2018-03-21 ENCOUNTER — Ambulatory Visit (INDEPENDENT_AMBULATORY_CARE_PROVIDER_SITE_OTHER): Payer: 59 | Admitting: Family Medicine

## 2018-03-21 VITALS — BP 123/83 | HR 74 | Ht 67.0 in | Wt 167.0 lb

## 2018-03-21 DIAGNOSIS — M25511 Pain in right shoulder: Secondary | ICD-10-CM

## 2018-03-21 DIAGNOSIS — M67813 Other specified disorders of tendon, right shoulder: Secondary | ICD-10-CM | POA: Diagnosis not present

## 2018-03-21 NOTE — Patient Instructions (Signed)
Thank you for coming in today.   Call or go to the ER if you develop a large red swollen joint with extreme pain or oozing puss.   

## 2018-03-21 NOTE — Progress Notes (Signed)
    Patient presents to clinic for  previously scheduled gadolinium interarticular contrast.  Procedure: Real-time Ultrasound Guided Injection of right glenohumeral joint Device: GE Logiq E  Images permanently stored and available for review in the ultrasound unit. Verbal informed consent obtained. Discussed risks and benefits of procedure. Warned about infection bleeding damage to structures skin hypopigmentation and fat atrophy among others. Patient expresses understanding and agreement Time-out conducted.  Noted no overlying erythema, induration, or other signs of local infection.  Skin prepped in a sterile fashion.  Local anesthesia: Topical Ethyl chloride.  With sterile technique and under real time ultrasound guidance:40 mg of Kenalog, 4 mL of Marcaine.  Syringe exchanged and 0.1 mL gadolinium contrast injected, syringe exchanged and 5 mL of sterile saline injected easily.  Completed without difficulty    Advised to call if fevers/chills, erythema, induration, drainage, or persistent bleeding.  Images permanently stored and available for review in the ultrasound unit.  Impression: Technically successful ultrasound guided injection.

## 2018-03-29 DIAGNOSIS — M25511 Pain in right shoulder: Secondary | ICD-10-CM | POA: Diagnosis not present

## 2018-04-01 ENCOUNTER — Other Ambulatory Visit: Payer: Self-pay

## 2018-04-01 ENCOUNTER — Encounter (HOSPITAL_BASED_OUTPATIENT_CLINIC_OR_DEPARTMENT_OTHER): Payer: Self-pay | Admitting: *Deleted

## 2018-04-07 ENCOUNTER — Encounter (HOSPITAL_BASED_OUTPATIENT_CLINIC_OR_DEPARTMENT_OTHER)
Admission: RE | Disposition: A | Discharge status: Home / Self Care | Payer: Self-pay | Source: Home / Self Care | Attending: Orthopaedic Surgery

## 2018-04-07 ENCOUNTER — Ambulatory Visit (HOSPITAL_BASED_OUTPATIENT_CLINIC_OR_DEPARTMENT_OTHER): Payer: 59 | Admitting: Anesthesiology

## 2018-04-07 ENCOUNTER — Ambulatory Visit (HOSPITAL_BASED_OUTPATIENT_CLINIC_OR_DEPARTMENT_OTHER)
Admission: RE | Admit: 2018-04-07 | Discharge: 2018-04-07 | Disposition: A | Discharge status: Home / Self Care | Payer: 59 | Attending: Orthopaedic Surgery | Admitting: Orthopaedic Surgery

## 2018-04-07 ENCOUNTER — Other Ambulatory Visit: Payer: Self-pay

## 2018-04-07 ENCOUNTER — Encounter (HOSPITAL_BASED_OUTPATIENT_CLINIC_OR_DEPARTMENT_OTHER): Payer: Self-pay | Admitting: Anesthesiology

## 2018-04-07 DIAGNOSIS — F329 Major depressive disorder, single episode, unspecified: Secondary | ICD-10-CM | POA: Diagnosis not present

## 2018-04-07 DIAGNOSIS — Z79899 Other long term (current) drug therapy: Secondary | ICD-10-CM | POA: Insufficient documentation

## 2018-04-07 DIAGNOSIS — M19011 Primary osteoarthritis, right shoulder: Secondary | ICD-10-CM | POA: Insufficient documentation

## 2018-04-07 DIAGNOSIS — D4709 Other mast cell neoplasms of uncertain behavior: Secondary | ICD-10-CM | POA: Insufficient documentation

## 2018-04-07 DIAGNOSIS — F419 Anxiety disorder, unspecified: Secondary | ICD-10-CM | POA: Insufficient documentation

## 2018-04-07 DIAGNOSIS — Z79890 Hormone replacement therapy (postmenopausal): Secondary | ICD-10-CM | POA: Insufficient documentation

## 2018-04-07 DIAGNOSIS — M24111 Other articular cartilage disorders, right shoulder: Secondary | ICD-10-CM | POA: Diagnosis not present

## 2018-04-07 DIAGNOSIS — M7541 Impingement syndrome of right shoulder: Secondary | ICD-10-CM | POA: Diagnosis not present

## 2018-04-07 DIAGNOSIS — E039 Hypothyroidism, unspecified: Secondary | ICD-10-CM | POA: Diagnosis not present

## 2018-04-07 DIAGNOSIS — M24011 Loose body in right shoulder: Secondary | ICD-10-CM | POA: Diagnosis not present

## 2018-04-07 DIAGNOSIS — E063 Autoimmune thyroiditis: Secondary | ICD-10-CM | POA: Insufficient documentation

## 2018-04-07 DIAGNOSIS — G8918 Other acute postprocedural pain: Secondary | ICD-10-CM | POA: Diagnosis not present

## 2018-04-07 HISTORY — DX: Hypothyroidism, unspecified: E03.9

## 2018-04-07 HISTORY — DX: Unspecified osteoarthritis, unspecified site: M19.90

## 2018-04-07 SURGERY — SHOULDER ARTHROSCOPY WITH SUBACROMIAL DECOMPRESSION AND DISTAL CLAVICLE EXCISION
Anesthesia: General | Site: Shoulder | Laterality: Right

## 2018-04-07 MED ORDER — BUPIVACAINE HCL (PF) 0.25 % IJ SOLN
INTRAMUSCULAR | Status: AC
  Filled 2018-04-07: qty 60

## 2018-04-07 MED ORDER — FENTANYL CITRATE (PF) 100 MCG/2ML IJ SOLN: 25.0000 ug | INTRAMUSCULAR | Status: DC | PRN

## 2018-04-07 MED ORDER — LACTATED RINGERS IV SOLN
INTRAVENOUS | Status: DC
  Administered 2018-04-07: 07:00:00 via INTRAVENOUS

## 2018-04-07 MED ORDER — ACETAMINOPHEN 325 MG PO TABS: 325.0000 mg | ORAL_TABLET | ORAL | Status: DC | PRN

## 2018-04-07 MED ORDER — ROPIVACAINE HCL 7.5 MG/ML IJ SOLN
INTRAMUSCULAR | Status: DC | PRN
  Administered 2018-04-07: 20 mL via PERINEURAL

## 2018-04-07 MED ORDER — ONDANSETRON HCL 4 MG/2ML IJ SOLN
INTRAMUSCULAR | Status: DC | PRN
  Administered 2018-04-07: 4 mg via INTRAVENOUS

## 2018-04-07 MED ORDER — PROPOFOL 10 MG/ML IV BOLUS
INTRAVENOUS | Status: DC | PRN
  Administered 2018-04-07: 150 mg via INTRAVENOUS

## 2018-04-07 MED ORDER — CLONIDINE HCL (ANALGESIA) 100 MCG/ML EP SOLN
EPIDURAL | Status: DC | PRN
  Administered 2018-04-07: 100 ug

## 2018-04-07 MED ORDER — FENTANYL CITRATE (PF) 100 MCG/2ML IJ SOLN
INTRAMUSCULAR | Status: AC
  Filled 2018-04-07: qty 2

## 2018-04-07 MED ORDER — LIDOCAINE 2% (20 MG/ML) 5 ML SYRINGE
INTRAMUSCULAR | Status: DC | PRN
  Administered 2018-04-07: 50 mg via INTRAVENOUS

## 2018-04-07 MED ORDER — BUPIVACAINE LIPOSOME 1.3 % IJ SUSP
INTRAMUSCULAR | Status: DC | PRN
  Administered 2018-04-07: 10 mL

## 2018-04-07 MED ORDER — DEXAMETHASONE SODIUM PHOSPHATE 10 MG/ML IJ SOLN
INTRAMUSCULAR | Status: AC
  Filled 2018-04-07: qty 1

## 2018-04-07 MED ORDER — MEPERIDINE HCL 25 MG/ML IJ SOLN: 6.2500 mg | INTRAMUSCULAR | Status: DC | PRN

## 2018-04-07 MED ORDER — ONDANSETRON HCL 4 MG/2ML IJ SOLN
INTRAMUSCULAR | Status: AC
  Filled 2018-04-07: qty 2

## 2018-04-07 MED ORDER — OXYCODONE HCL 5 MG/5ML PO SOLN: 5.0000 mg | Freq: Once | ORAL | Status: DC | PRN

## 2018-04-07 MED ORDER — ONDANSETRON HCL 4 MG/2ML IJ SOLN: 4.0000 mg | Freq: Once | INTRAMUSCULAR | Status: DC | PRN

## 2018-04-07 MED ORDER — ONDANSETRON HCL 4 MG PO TABS: 4.0000 mg | ORAL_TABLET | Freq: Three times a day (TID) | ORAL | 1 refills | Status: AC | PRN

## 2018-04-07 MED ORDER — MIDAZOLAM HCL 2 MG/2ML IJ SOLN
1.0000 mg | INTRAMUSCULAR | Status: DC | PRN
  Administered 2018-04-07: 2 mg via INTRAVENOUS

## 2018-04-07 MED ORDER — EPINEPHRINE 30 MG/30ML IJ SOLN
INTRAMUSCULAR | Status: AC
  Filled 2018-04-07: qty 1

## 2018-04-07 MED ORDER — MELOXICAM 7.5 MG PO TABS: 7.5000 mg | ORAL_TABLET | Freq: Every day | ORAL | 2 refills | Status: DC

## 2018-04-07 MED ORDER — SODIUM CHLORIDE 0.9 % IR SOLN
Status: DC | PRN
  Administered 2018-04-07: 5000 mL

## 2018-04-07 MED ORDER — SCOPOLAMINE 1 MG/3DAYS TD PT72: 1.0000 | MEDICATED_PATCH | Freq: Once | TRANSDERMAL | Status: DC | PRN

## 2018-04-07 MED ORDER — MIDAZOLAM HCL 2 MG/2ML IJ SOLN
INTRAMUSCULAR | Status: AC
  Filled 2018-04-07: qty 2

## 2018-04-07 MED ORDER — CEFAZOLIN SODIUM-DEXTROSE 2-4 GM/100ML-% IV SOLN
2.0000 g | INTRAVENOUS | Status: AC
  Administered 2018-04-07: 2 g via INTRAVENOUS

## 2018-04-07 MED ORDER — OXYCODONE HCL 5 MG PO TABS: ORAL_TABLET | ORAL | 0 refills | Status: AC

## 2018-04-07 MED ORDER — LIDOCAINE 2% (20 MG/ML) 5 ML SYRINGE
INTRAMUSCULAR | Status: AC
  Filled 2018-04-07: qty 5

## 2018-04-07 MED ORDER — DEXAMETHASONE SODIUM PHOSPHATE 4 MG/ML IJ SOLN
INTRAMUSCULAR | Status: DC | PRN
  Administered 2018-04-07: 10 mg via INTRAVENOUS

## 2018-04-07 MED ORDER — PROPOFOL 500 MG/50ML IV EMUL
INTRAVENOUS | Status: AC
  Filled 2018-04-07: qty 50

## 2018-04-07 MED ORDER — OXYCODONE HCL 5 MG PO TABS: 5.0000 mg | ORAL_TABLET | Freq: Once | ORAL | Status: DC | PRN

## 2018-04-07 MED ORDER — ACETAMINOPHEN 500 MG PO TABS: 1000.0000 mg | ORAL_TABLET | Freq: Three times a day (TID) | ORAL | 0 refills | Status: AC

## 2018-04-07 MED ORDER — ACETAMINOPHEN 160 MG/5ML PO SOLN: 325.0000 mg | ORAL | Status: DC | PRN

## 2018-04-07 MED ORDER — CHLORHEXIDINE GLUCONATE 4 % EX LIQD: 60.0000 mL | Freq: Once | CUTANEOUS | Status: DC

## 2018-04-07 MED ORDER — FENTANYL CITRATE (PF) 100 MCG/2ML IJ SOLN
50.0000 ug | INTRAMUSCULAR | Status: DC | PRN
  Administered 2018-04-07: 100 ug via INTRAVENOUS

## 2018-04-07 MED ORDER — CEFAZOLIN SODIUM-DEXTROSE 2-4 GM/100ML-% IV SOLN
INTRAVENOUS | Status: AC
  Filled 2018-04-07: qty 100

## 2018-04-07 MED FILL — ONDANSETRON HCL 4 MG TABLET: 4 | 4 days supply | Qty: 10 | Fill #0

## 2018-04-07 MED FILL — oxyCODONE HCL 5 MG TABS: 5 | 5 days supply | Qty: 30 | Fill #0

## 2018-04-07 MED FILL — MELOXICAM 7.5 MG TABLET: 7.5 | 30 days supply | Qty: 30 | Fill #0

## 2018-04-07 SURGICAL SUPPLY — 77 items
BLADE EXCALIBUR 4.0MM X 13CM (MISCELLANEOUS) ×1
BLADE EXCALIBUR 4.0X13 (MISCELLANEOUS) ×2 IMPLANT
BLADE SHAVER BONE 5.0MM X 13CM (MISCELLANEOUS)
BLADE SHAVER BONE 5.0X13 (MISCELLANEOUS) IMPLANT
BNDG COHESIVE 4X5 TAN STRL (GAUZE/BANDAGES/DRESSINGS) IMPLANT
BURR OVAL 8 FLU 4.0MM X 13CM (MISCELLANEOUS) ×1
BURR OVAL 8 FLU 4.0X13 (MISCELLANEOUS) ×2 IMPLANT
CANNULA 5.75X71 LONG (CANNULA) IMPLANT
CANNULA PASSPORT 5 (CANNULA) IMPLANT
CANNULA PASSPORT 5CM (CANNULA)
CANNULA PASSPORT BUTTON 10-40 (CANNULA) IMPLANT
CANNULA TWIST IN 8.25X7CM (CANNULA) IMPLANT
CHLORAPREP W/TINT 26ML (MISCELLANEOUS) ×3 IMPLANT
CLOSURE WOUND 1/2 X4 (GAUZE/BANDAGES/DRESSINGS) ×1
COVER WAND RF STERILE (DRAPES) IMPLANT
DECANTER SPIKE VIAL GLASS SM (MISCELLANEOUS) IMPLANT
DISSECTOR 3.5MM X 13CM CVD (MISCELLANEOUS) IMPLANT
DISSECTOR 4.0MMX13CM CVD (MISCELLANEOUS) IMPLANT
DRAPE IMP U-DRAPE 54X76 (DRAPES) ×3 IMPLANT
DRAPE INCISE IOBAN 66X45 STRL (DRAPES) IMPLANT
DRAPE SHOULDER BEACH CHAIR (DRAPES) ×3 IMPLANT
DRSG PAD ABDOMINAL 8X10 ST (GAUZE/BANDAGES/DRESSINGS) ×3 IMPLANT
DW OUTFLOW CASSETTE/TUBE SET (MISCELLANEOUS) ×3 IMPLANT
ELECT NEEDLE TIP 2.8 STRL (NEEDLE) IMPLANT
ELECT REM PT RETURN 9FT ADLT (ELECTROSURGICAL)
ELECTRODE REM PT RTRN 9FT ADLT (ELECTROSURGICAL) IMPLANT
GAUZE SPONGE 4X4 12PLY STRL (GAUZE/BANDAGES/DRESSINGS) ×3 IMPLANT
GAUZE XEROFORM 1X8 LF (GAUZE/BANDAGES/DRESSINGS) IMPLANT
GLOVE BIOGEL PI IND STRL 7.0 (GLOVE) ×2 IMPLANT
GLOVE BIOGEL PI IND STRL 8 (GLOVE) ×1 IMPLANT
GLOVE BIOGEL PI INDICATOR 7.0 (GLOVE) ×4
GLOVE BIOGEL PI INDICATOR 8 (GLOVE) ×2
GLOVE ECLIPSE 6.5 STRL STRAW (GLOVE) ×3 IMPLANT
GLOVE ECLIPSE 8.0 STRL XLNG CF (GLOVE) ×3 IMPLANT
GOWN STRL REUS W/ TWL LRG LVL3 (GOWN DISPOSABLE) ×1 IMPLANT
GOWN STRL REUS W/TWL LRG LVL3 (GOWN DISPOSABLE) ×2
GOWN STRL REUS W/TWL XL LVL3 (GOWN DISPOSABLE) ×3 IMPLANT
KIT STABILIZATION SHOULDER (MISCELLANEOUS) ×3 IMPLANT
KIT STR SPEAR 1.8 FBRTK DISP (KITS) IMPLANT
LASSO 90 CVE QUICKPAS (DISPOSABLE) IMPLANT
MANIFOLD NEPTUNE II (INSTRUMENTS) ×3 IMPLANT
NDL SAFETY ECLIPSE 18X1.5 (NEEDLE) ×1 IMPLANT
NDL SUT 6 .5 CRC .975X.05 MAYO (NEEDLE) IMPLANT
NEEDLE HYPO 18GX1.5 SHARP (NEEDLE) ×2
NEEDLE MAYO TAPER (NEEDLE)
NEEDLE SCORPION MULTI FIRE (NEEDLE) IMPLANT
PACK ARTHROSCOPY DSU (CUSTOM PROCEDURE TRAY) ×3 IMPLANT
PACK BASIN DAY SURGERY FS (CUSTOM PROCEDURE TRAY) ×3 IMPLANT
PENCIL BUTTON HOLSTER BLD 10FT (ELECTRODE) IMPLANT
PORT APPOLLO RF 90DEGREE MULTI (SURGICAL WAND) ×3 IMPLANT
RESTRAINT HEAD UNIVERSAL NS (MISCELLANEOUS) ×3 IMPLANT
SHEET MEDIUM DRAPE 40X70 STRL (DRAPES) ×3 IMPLANT
SLEEVE SCD COMPRESS KNEE MED (MISCELLANEOUS) ×3 IMPLANT
SLING ARM FOAM STRAP LRG (SOFTGOODS) ×3 IMPLANT
SLING ARM IMMOBILIZER LRG (SOFTGOODS) IMPLANT
SLING ARM IMMOBILIZER MED (SOFTGOODS) IMPLANT
SLING ARM MED ADULT FOAM STRAP (SOFTGOODS) IMPLANT
SLING ARM XL FOAM STRAP (SOFTGOODS) IMPLANT
SPONGE LAP 4X18 RFD (DISPOSABLE) IMPLANT
STRIP CLOSURE SKIN 1/2X4 (GAUZE/BANDAGES/DRESSINGS) ×2 IMPLANT
SUCTION FRAZIER HANDLE 10FR (MISCELLANEOUS)
SUCTION TUBE FRAZIER 10FR DISP (MISCELLANEOUS) IMPLANT
SUT FIBERWIRE #2 38 T-5 BLUE (SUTURE)
SUT MNCRL AB 4-0 PS2 18 (SUTURE) ×3 IMPLANT
SUT PDS AB 1 CT  36 (SUTURE)
SUT PDS AB 1 CT 36 (SUTURE) IMPLANT
SUT TIGER TAPE 7 IN WHITE (SUTURE) IMPLANT
SUTURE FIBERWR #2 38 T-5 BLUE (SUTURE) IMPLANT
SUTURE TAPE TIGERLINK 1.3MM BL (SUTURE) IMPLANT
SUTURETAPE TIGERLINK 1.3MM BL (SUTURE)
SYR 5ML LUER SLIP (SYRINGE) ×3 IMPLANT
TAPE FIBER 2MM 7IN #2 BLUE (SUTURE) IMPLANT
TOWEL GREEN STERILE FF (TOWEL DISPOSABLE) ×3 IMPLANT
TUBE CONNECTING 20'X1/4 (TUBING)
TUBE CONNECTING 20X1/4 (TUBING) IMPLANT
TUBE SUCTION HIGH CAP CLEAR NV (SUCTIONS) IMPLANT
TUBING ARTHROSCOPY IRRIG 16FT (MISCELLANEOUS) ×3 IMPLANT

## 2018-04-07 NOTE — Anesthesia Procedure Notes (Signed)
Anesthesia Regional Block: Interscalene brachial plexus block   Pre-Anesthetic Checklist: ,, timeout performed, Correct Patient, Correct Site, Correct Laterality, Correct Procedure, Correct Position, site marked, Risks and benefits discussed,  Surgical consent,  Pre-op evaluation,  At surgeon's request and post-op pain management  Laterality: Left  Prep: chloraprep       Needles:  Injection technique: Single-shot  Needle Type: Echogenic Stimulator Needle     Needle Length: 5cm  Needle Gauge: 22     Additional Needles:   Procedures:, nerve stimulator,,, ultrasound used (permanent image in chart),,,,   Nerve Stimulator or Paresthesia:  Response: shoulder, 0.45 mA,   Additional Responses:   Narrative:  Start time: 04/07/2018 7:50 AM End time: 04/07/2018 7:55 AM Injection made incrementally with aspirations every 5 mL.  Performed by: Personally  Anesthesiologist: Janeece Riggers, MD  Additional Notes: Functioning IV was confirmed and monitors were applied.  A 54mm 22ga Arrow echogenic stimulator needle was used. Sterile prep and drape,hand hygiene and sterile gloves were used. Ultrasound guidance: relevant anatomy identified, needle position confirmed, local anesthetic spread visualized around nerve(s)., vascular puncture avoided.  Image printed for medical record. Negative aspiration and negative test dose prior to incremental administration of local anesthetic. The patient tolerated the procedure well.

## 2018-04-07 NOTE — Anesthesia Preprocedure Evaluation (Addendum)
Anesthesia Evaluation  Patient identified by MRN, date of birth, ID band Patient awake    Reviewed: Allergy & Precautions, H&P , NPO status , Patient's Chart, lab work & pertinent test results, reviewed documented beta blocker date and time   Airway Mallampati: I  TM Distance: >3 FB Neck ROM: full    Dental no notable dental hx. (+) Teeth Intact   Pulmonary neg pulmonary ROS,    Pulmonary exam normal breath sounds clear to auscultation       Cardiovascular Exercise Tolerance: Good negative cardio ROS   Rhythm:regular Rate:Normal     Neuro/Psych PSYCHIATRIC DISORDERS Anxiety Depression negative neurological ROS     GI/Hepatic negative GI ROS, Neg liver ROS,   Endo/Other  Hypothyroidism   Renal/GU negative Renal ROS  negative genitourinary   Musculoskeletal  (+) Arthritis , Osteoarthritis,    Abdominal   Peds  Hematology negative hematology ROS (+)   Anesthesia Other Findings   Reproductive/Obstetrics negative OB ROS                            Anesthesia Physical Anesthesia Plan  ASA: II  Anesthesia Plan: General   Post-op Pain Management: GA combined w/ Regional for post-op pain   Induction: Intravenous  PONV Risk Score and Plan:   Airway Management Planned: LMA and Oral ETT  Additional Equipment:   Intra-op Plan:   Post-operative Plan:   Informed Consent: I have reviewed the patients History and Physical, chart, labs and discussed the procedure including the risks, benefits and alternatives for the proposed anesthesia with the patient or authorized representative who has indicated his/her understanding and acceptance.     Dental Advisory Given  Plan Discussed with: CRNA, Anesthesiologist and Surgeon  Anesthesia Plan Comments: ( )        Anesthesia Quick Evaluation

## 2018-04-07 NOTE — H&P (Signed)
PREOPERATIVE H&P  Chief Complaint: RIGHT SHOULDER OSTEOARTHRITIS, IMPINGEMENT SYNDROME AND OTHER ARTICULAR CARTILAGE DISORDERS M24.111, M.19.011, M.75.41  HPI: Kayla Lindsey is a 53 y.o. female who presents for preoperative history and physical with a diagnosis of RIGHT SHOULDER OSTEOARTHRITIS, IMPINGEMENT SYNDROME AND OTHER ARTICULAR CARTILAGE DISORDERS M24.111, M.19.011, M.75.41. Symptoms are rated as moderate to severe, and have been worsening.  This is significantly impairing activities of daily living.  Please see my clinic note for full details on this patient's care.  She has elected for surgical management.   Past Medical History:  Diagnosis Date  . Anxiety   . Depression   . Hashimoto's thyroiditis   . Hypothyroidism   . Mastocytosis   . Osteoarthritis    right shoulder   Past Surgical History:  Procedure Laterality Date  . APPENDECTOMY    . BREAST BIOPSY Bilateral   . BREAST LUMPECTOMY Right   . carpel tunnel    . CHOLECYSTECTOMY    . HIP SURGERY    . SHOULDER ACROMIOPLASTY     Social History   Socioeconomic History  . Marital status: Married    Spouse name: Not on file  . Number of children: Not on file  . Years of education: Not on file  . Highest education level: Not on file  Occupational History  . Occupation: Therapist, sports: [employer]  . Financial resource strain: Not on file  . Food insecurity:    Worry: Not on file    Inability: Not on file  . Transportation needs:    Medical: Not on file    Non-medical: Not on file  Tobacco Use  . Smoking status: Never Smoker  . Smokeless tobacco: Never Used  Substance and Sexual Activity  . Alcohol use: Yes    Frequency: Never    Comment: social  . Drug use: Not Currently  . Sexual activity: Yes    Partners: Male    Birth control/protection: Other-see comments    Comment: Vasectomy  Lifestyle  . Physical activity:    Days per week: Not on file    Minutes per session: Not  on file  . Stress: Not on file  Relationships  . Social connections:    Talks on phone: Not on file    Gets together: Not on file    Attends religious service: Not on file    Active member of club or organization: Not on file    Attends meetings of clubs or organizations: Not on file    Relationship status: Not on file  Other Topics Concern  . Not on file  Social History Narrative  . Not on file   Family History  Problem Relation Age of Onset  . Thyroid disease Mother   . Heart disease Mother   . COPD Mother   . Cancer Mother   . Esophageal cancer Mother   . COPD Father    No Known Allergies Prior to Admission medications   Medication Sig Start Date End Date Taking? Authorizing Provider  Azelaic Acid 15 % cream Apply 1 application topically 2 (two) times daily. After skin is thoroughly washed and patted dry 12/30/17  Yes Emeterio Reeve, DO  fexofenadine (ALLEGRA) 180 MG tablet Take 180 mg by mouth daily.   Yes [provider]  levothyroxine (SYNTHROID, LEVOTHROID) 125 MCG tablet Take 1 tablet (125 mcg total) by mouth daily before breakfast. 02/21/18  Yes Emeterio Reeve, DO  naproxen sodium (ALEVE) 220 MG tablet  Take 220 mg by mouth.   Yes [provider]  Sulfacetamide in Bakuchiol (SODIUM SULFACETAMIDE [place]) 10 % LIQD Apply to face topically twice a day as face wash. 12/28/17  Yes Emeterio Reeve, DO  venlafaxine XR (EFFEXOR-XR) 150 MG 24 hr capsule Take 1 capsule (150 mg total) by mouth daily with breakfast. 02/21/18 05/22/18 Yes Emeterio Reeve, DO     Positive ROS: All other systems have been reviewed and were otherwise negative with the exception of those mentioned in the HPI and as above.  Physical Exam: General: Alert, no acute distress Cardiovascular: No pedal edema Respiratory: No cyanosis, no use of accessory musculature GI: No organomegaly, abdomen is soft and non-tender Skin: No lesions in the area of chief complaint Neurologic:  Sensation intact distally Psychiatric: Patient is competent for consent with normal mood and affect Lymphatic: No axillary or cervical lymphadenopathy  MUSCULOSKELETAL: R shoulder: pain with ROM, +AC and impingement ttp.  WWP extremity, + mechanical symptoms.  Assessment: RIGHT SHOULDER OSTEOARTHRITIS, IMPINGEMENT SYNDROME AND OTHER ARTICULAR CARTILAGE DISORDERS M24.111, M.19.011, M.75.41  Plan: Plan for Procedure(s): SHOULDER ARTHROSCOPY WITH DEBRIDEMENT, SUBACROMIAL DECOMPRESSION PARTIAL ACROMIOPLASTY WITH CORACOACROMIAL RELEASE AND DISTAL CLAVICULECTOMY  The risks benefits and alternatives were discussed with the patient including but not limited to the risks of nonoperative treatment, versus surgical intervention including infection, bleeding, nerve injury,  blood clots, cardiopulmonary complications, morbidity, mortality, among others, and they were willing to proceed.   Hiram Gash, MD  04/07/2018 6:59 AM

## 2018-04-07 NOTE — Op Note (Signed)
Orthopaedic Surgery Operative Note (CSN: [id number])  Kayla Lindsey  [birth year] Date of Surgery: 04/07/2018   Diagnoses:  Right shoulder pain, mechanical symptoms, impingement and AC arthrosis  Procedure: Right shoulder arthroscopic extensive debridement Right shoulder subacromial decompression Right distal clavicle excision arthroscopic    Operative Finding Successful completion of planned procedure.  Patient had full-thickness grade 4 loss of the superior anterior quadrant of the glenoid.  She had multiple 1 x 1 cm loose bodies.  She had scattered grade 4 lesions throughout her humeral head as well that there was some intact articular cartilage.  Her cuff was intact both articular and bursal side.  Her motion was normal.  She had a slight acromial spur that was debrided and shaved back to a flat surface and a 10 mm resection of her distal clavicle.  If she fails the surgery and has more loose body she may consider total shoulder arthroplasty which would be a reasonable candidate for.  Post-operative plan: The patient will be nonweightbearing in a sling for a week.  The patient will be discharged home.  DVT prophylaxis not indicated in isolated upper extremity surgery patient with no specific risks factors.  Pain control with PRN pain medication preferring oral medicines.  Follow up plan will be scheduled in approximately 7 days for incision check and XR.  Post-Op Diagnosis: Same Surgeons:Primary: Hiram Gash, MD Assistants: Location: [facility] OR ROOM 6 Anesthesia: General Antibiotics: Ancef 2g preop Tourniquet time:  Estimated Blood Loss: minimal Complications: None Specimens: None Implants: * No implants in log *  Indications for Surgery:   Kayla Lindsey is a 53 y.o. female with tenured right shoulder pain and mechanical symptoms after previous surgeries.  Known history of arthritis there was mild and a previous cuff repair.  Benefits and risks of operative and  nonoperative management were discussed prior to surgery with patient/guardian(s) and informed consent form was completed.  Specific risks including infection, need for additional surgery, continued symptoms, arthritic type symptoms, cuff failure, need for total shoulder arthroplasty.   Procedure:   The patient was identified in the preoperative holding area where the surgical site was marked. The patient was taken to the OR where a procedural timeout was called and the above noted anesthesia was induced.  The patient was positioned beachchair on [employer] table.  Preoperative antibiotics were dosed.  The patient's right shoulder was prepped and draped in the usual sterile fashion.  A second preoperative timeout was called.      We performed a basic arthroscopy with 2 typical anterior and posterior portals.  We performed chondroplasty of the humeral head, the glenoid, debridement of the labrum and removal of multiple loose bodies in the intra-articular space.  The debridements were extensive and performed with a shaver.  Loose bodies removed with a grasper.  Subacromial decompression: We made a lateral portal with spinal needle guidance. We then proceeded to debride bursal tissue extensively with a shaver and arthrocare device. At that point we continued to identify the borders of the acromion and identify the spur. We then carefully preserved the deltoid fascia and used a burr to convert the type 2 acromion to a Type 1 flat acromion without issue.  Distal Clavicle resection:  The scope was placed in the subacromial space from the posterior portal.  A hemostat was placed through the anterior portal and we spread at the [facility] joint.  A burr was then inserted and 10 mm of distal clavicle was resected taking care to avoid  damage to the capsule around the joint and avoiding overhanging bone posteriorly.    Incisions closed with absorbable suture.  Sling placed.  Patient awoken and taken to PACU in stable  condition.

## 2018-04-07 NOTE — Anesthesia Postprocedure Evaluation (Signed)
Anesthesia Post Note  Patient: Kayla Lindsey  Procedure(s) Performed: SHOULDER ARTHROSCOPY WITH DEBRIDEMENT, SUBACROMIAL DECOMPRESSION PARTIAL ACROMIOPLASTY WITH CORACOACROMIAL RELEASE AND DISTAL CLAVICULECTOMY AND EXTENSIVE DEBRIDEMENT (Right Shoulder)     Patient location during evaluation: PACU Anesthesia Type: General Level of consciousness: awake and alert Pain management: pain level controlled Vital Signs Assessment: post-procedure vital signs reviewed and stable Respiratory status: spontaneous breathing, nonlabored ventilation, respiratory function stable and patient connected to nasal cannula oxygen Cardiovascular status: blood pressure returned to baseline and stable Postop Assessment: no apparent nausea or vomiting Anesthetic complications: no    Last Vitals:  Vitals:   04/07/18 0855 04/07/18 0900  BP:  119/80  Pulse: 83 85  Resp: 18 15  Temp:    SpO2: 100% 100%    Last Pain:  Vitals:   04/07/18 0840  TempSrc:   PainSc: 0-No pain                 Derry Kassel

## 2018-04-07 NOTE — Progress Notes (Signed)
Assisted Dr. Ambrose Pancoast with right, ultrasound guided, interscalene  block. Side rails up, monitors on throughout procedure. See vital signs in flow sheet. Tolerated Procedure well.

## 2018-04-07 NOTE — Transfer of Care (Signed)
Immediate Anesthesia Transfer of Care Note  Patient: Kayla Lindsey  Procedure(s) Performed: SHOULDER ARTHROSCOPY WITH DEBRIDEMENT, SUBACROMIAL DECOMPRESSION PARTIAL ACROMIOPLASTY WITH CORACOACROMIAL RELEASE AND DISTAL CLAVICULECTOMY AND EXTENSIVE DEBRIDEMENT (Right Shoulder)  Patient Location: PACU  Anesthesia Type:General and Regional  Level of Consciousness: awake and sedated  Airway & Oxygen Therapy: Patient Spontanous Breathing and Patient connected to face mask oxygen  Post-op Assessment: Report given to RN and Post -op Vital signs reviewed and stable  Post vital signs: Reviewed and stable  Last Vitals:  Vitals Value Taken Time  BP 101/68 04/07/2018  8:35 AM  Temp    Pulse 73 04/07/2018  8:37 AM  Resp 16 04/07/2018  8:37 AM  SpO2 99 % 04/07/2018  8:37 AM  Vitals shown include unvalidated device data.  Last Pain:  Vitals:   04/07/18 0626  TempSrc: Oral  PainSc: 2       Patients Stated Pain Goal: 1 (83/72/90 2111)  Complications: No apparent anesthesia complications

## 2018-04-07 NOTE — Anesthesia Procedure Notes (Signed)
Procedure Name: LMA Insertion Performed by: Gessica Jawad W, CRNA Pre-anesthesia Checklist: Patient identified, Emergency Drugs available, Suction available and Patient being monitored Patient Re-evaluated:Patient Re-evaluated prior to induction Oxygen Delivery Method: Circle system utilized Preoxygenation: Pre-oxygenation with 100% oxygen Induction Type: IV induction Ventilation: Mask ventilation without difficulty LMA: LMA inserted LMA Size: 4.0 Number of attempts: 1 Placement Confirmation: positive ETCO2 Tube secured with: Tape Dental Injury: Teeth and Oropharynx as per pre-operative assessment        

## 2018-04-07 NOTE — Discharge Instructions (Signed)
Regional Anesthesia Blocks ° °1. Numbness or the inability to move the "blocked" extremity may last from 3-48 hours after placement. The length of time depends on the medication injected and your individual response to the medication. If the numbness is not going away after 48 hours, call your surgeon. ° °2. The extremity that is blocked will need to be protected until the numbness is gone and the  Strength has returned. Because you cannot feel it, you will need to take extra care to avoid injury. Because it may be weak, you may have difficulty moving it or using it. You may not know what position it is in without looking at it while the block is in effect. ° °3. For blocks in the legs and feet, returning to weight bearing and walking needs to be done carefully. You will need to wait until the numbness is entirely gone and the strength has returned. You should be able to move your leg and foot normally before you try and bear weight or walk. You will need someone to be with you when you first try to ensure you do not fall and possibly risk injury. ° °4. Bruising and tenderness at the needle site are common side effects and will resolve in a few days. ° °5. Persistent numbness or new problems with movement should be communicated to the surgeon or the [facility] ([phone])/ [facility] ([phone]). ° ° °Information for Discharge Teaching: °EXPAREL (bupivacaine liposome injectable suspension)  ° °Your surgeon or anesthesiologist gave you EXPAREL(bupivacaine) to help control your pain after surgery.  °· EXPAREL is a local anesthetic that provides pain relief by numbing the tissue around the surgical site. °· EXPAREL is designed to release pain medication over time and can control pain for up to 72 hours. °· Depending on how you respond to EXPAREL, you may require less pain medication during your recovery. ° °Possible side effects: °· Temporary loss of sensation or ability to move in the  area where bupivacaine was injected. °· Nausea, vomiting, constipation °· Rarely, numbness and tingling in your mouth or lips, lightheadedness, or anxiety may occur. °· Call your doctor right away if you think you may be experiencing any of these sensations, or if you have other questions regarding possible side effects. ° °Follow all other discharge instructions given to you by your surgeon or nurse. Eat a healthy diet and drink plenty of water or other fluids. ° °If you return to the hospital for any reason within 96 hours following the administration of EXPAREL, it is important for health care providers to know that you have received this anesthetic. A teal colored band has been placed on your arm with the date, time and amount of EXPAREL you have received in order to alert and inform your health care providers. Please leave this armband in place for the full 96 hours following administration, and then you may remove the band. ° ° ° °Post Anesthesia Home Care Instructions ° °Activity: °Get plenty of rest for the remainder of the day. A responsible individual must stay with you for 24 hours following the procedure.  °For the next 24 hours, DO NOT: °-Drive a car °-Operate machinery °-Drink alcoholic beverages °-Take any medication unless instructed by your physician °-Make any legal decisions or sign important papers. ° °Meals: °Start with liquid foods such as gelatin or soup. Progress to regular foods as tolerated. Avoid greasy, spicy, heavy foods. If nausea and/or vomiting occur, drink only clear liquids   until the nausea and/or vomiting subsides. Call your physician if vomiting continues. ° °Special Instructions/Symptoms: °Your throat may feel dry or sore from the anesthesia or the breathing tube placed in your throat during surgery. If this causes discomfort, gargle with warm salt water. The discomfort should disappear within 24 hours. ° °If you had a scopolamine patch placed behind your ear for the management  of post- operative nausea and/or vomiting: ° °1. The medication in the patch is effective for 72 hours, after which it should be removed.  Wrap patch in a tissue and discard in the trash. Wash hands thoroughly with soap and water. °2. You may remove the patch earlier than 72 hours if you experience unpleasant side effects which may include dry mouth, dizziness or visual disturbances. °3. Avoid touching the patch. Wash your hands with soap and water after contact with the patch. °   ° °

## 2018-04-07 NOTE — Progress Notes (Signed)
Patient called from home after surgery today and said her right eyelid is drooping. She said her smile is symmetrical and it is just her eyelid and otherwise feels fine. Patient had right interscalene block this morning prior to surgery by Dr Ambrose Pancoast. Dr Ambrose Pancoast notified and will be calling patient to discuss.

## 2018-04-15 DIAGNOSIS — M19011 Primary osteoarthritis, right shoulder: Secondary | ICD-10-CM | POA: Diagnosis not present

## 2018-04-25 ENCOUNTER — Other Ambulatory Visit: Payer: Self-pay

## 2018-04-25 ENCOUNTER — Ambulatory Visit (INDEPENDENT_AMBULATORY_CARE_PROVIDER_SITE_OTHER): Payer: 59 | Admitting: Rehabilitative and Restorative Service Providers"

## 2018-04-25 ENCOUNTER — Encounter: Payer: Self-pay | Admitting: Rehabilitative and Restorative Service Providers"

## 2018-04-25 DIAGNOSIS — R29898 Other symptoms and signs involving the musculoskeletal system: Secondary | ICD-10-CM

## 2018-04-25 DIAGNOSIS — M25511 Pain in right shoulder: Secondary | ICD-10-CM | POA: Diagnosis not present

## 2018-04-25 DIAGNOSIS — R293 Abnormal posture: Secondary | ICD-10-CM

## 2018-04-25 NOTE — Therapy (Signed)
[facility] [place], [place], [address] Phone: [phone]   Fax:  [phone]  Physical Therapy Evaluation  Patient Details  Name: Kayla Lindsey MRN: [id number] Date of Birth: 1965-12-29 Referring Provider (PT): Dr Ophelia Charter    Encounter Date: 04/25/2018  PT End of Session - 04/25/18 0803    Visit Number  1    Number of Visits  12    Date for PT Re-Evaluation  06/06/18    PT Start Time  0800    PT Stop Time  0854    PT Time Calculation (min)  54 min    Activity Tolerance  Patient tolerated treatment well       Past Medical History:  Diagnosis Date  . Anxiety   . Depression   . Hashimoto's thyroiditis   . Hypothyroidism   . Mastocytosis   . Osteoarthritis    right shoulder    Past Surgical History:  Procedure Laterality Date  . APPENDECTOMY    . BREAST BIOPSY Bilateral   . BREAST LUMPECTOMY Right   . carpel tunnel    . CHOLECYSTECTOMY    . HIP SURGERY    . SHOULDER ACROMIOPLASTY      There were no vitals filed for this visit.   Subjective Assessment - 04/25/18 0807    Subjective  Patient reports that she underwent SCR and SAD with labral debridement 04/07/2018. She reports that she has been using the Rt UE for all functional activities. She has had continued pain in the Rt shoulder.     Pertinent History  prior shoulder surgeries 2012; 2017; hx of anxiety and depression     Patient Stated Goals  learn how to care for shoulder; increase ROM; stop pain     Currently in Pain?  Yes    Pain Score  5     Pain Location  Shoulder    Pain Orientation  Right    Pain Descriptors / Indicators  Aching    Pain Type  Acute pain    Pain Onset  More than a month ago    Pain Frequency  Constant    Aggravating Factors   using Rt UE    Pain Relieving Factors  rest; meds          [facility] PT Assessment - 04/25/18 0001      Assessment   Medical Diagnosis  Rt shoulder pain    Referring Provider  (PT)  Dr Ophelia Charter     Onset Date/Surgical Date  04/07/18   onset of pain 02/06/18; pain for ~ 8 yrs    Hand Dominance  Right    Next MD Visit  4 wks     Prior Therapy  yes after prior surgeries and here prior to recent surgery       Observation/Other Assessments   Focus on Therapeutic Outcomes (FOTO)   48% limitation       Sensation   Additional Comments  occ radiating into volar Rt wrist       Posture/Postural Control   Posture Comments  head forward; shoulders rounded and elevated       AROM   Overall AROM Comments  pain with ROM Rt shoulder     Right Shoulder Extension  32 Degrees    Right Shoulder Flexion  130 Degrees    Right Shoulder ABduction  143 Degrees    Right Shoulder Internal Rotation  4 Degrees    Right Shoulder External Rotation  90 Degrees    Left Shoulder Extension  59 Degrees    Left Shoulder Flexion  153 Degrees    Left Shoulder ABduction  151 Degrees    Left Shoulder Internal Rotation  32 Degrees    Left Shoulder External Rotation  90 Degrees      Strength   Overall Strength Comments  strength not tested resistively       Palpation   Palpation comment  muscular tightness through the Rt shoulder girdle - pecs; upper trap; leveator; deltoid                 Objective measurements completed on examination: See above findings.      [place] [facility] PT Treatment/Exercise - 04/25/18 0001      Shoulder Exercises: Standing   Other Standing Exercises  scap squeeze 10 sec x 10 reps; axial extensoin 5 sec x 5 with noodle       Shoulder Exercises: Pulleys   Flexion  --   10 reps x 10 sec hold    Scaption  --   10 reps x 10 sec hold     Shoulder Exercises: Stretch   Table Stretch - Flexion  5 reps;10 seconds   seated      Vasopneumatic   Number Minutes Vasopneumatic   15 minutes    Vasopnuematic Location   Shoulder   Rt   Vasopneumatic Pressure  Low    Vasopneumatic Temperature   34 deg              PT Education - 04/25/18 0840     Education Details  HEP     Person(s) Educated  Patient    Methods  Explanation;Demonstration;Tactile cues;Verbal cues;Handout    Comprehension  Verbalized understanding;Returned demonstration;Verbal cues required;Tactile cues required          PT Long Term Goals - 04/25/18 1411      PT LONG TERM GOAL #1   Title  Improve posture and alignment with patient to demonstrate improved upright posture with posterior shoulder girdle engaged 06/06/2018    Time  6    Period  Weeks    Status  New      PT LONG TERM GOAL #2   Title  Increased AROM Rt shoulder to =/> AROM Lt shoulder 06/06/2018    Time  6    Period  Weeks    Status  New      PT LONG TERM GOAL #3   Title  Decrease pain Rt shoulder allowing patient to return to normal functional activities with minimal to no pain or limitations 06/06/2018    Time  6    Period  Weeks    Status  New      PT LONG TERM GOAL #4   Title  Independent in HEP 06/06/2018    Time  6    Period  Weeks    Status  New      PT LONG TERM GOAL #5   Title  Improve FOTO to </= 49% limitation 06/06/2018    Time  6    Period  Weeks    Status  New             Plan - 04/25/18 0804    Clinical Impression Statement  Patient returns s/p Rt shoulder surgery 04/07/2018 for DCR; SAD; labral debridement following several months of shoulder pain. Patient has a history of shoulder dysfunction and 2 prior surgeries; abnormal posture and alignment; decreased ROM/function Rt UE;  muscular tightness Rt upper quarter. She will benefir from PT to address problems identified.     History and Personal Factors relevant to plan of care:  long standing history of Rt shoulder pain    Clinical Presentation  Evolving    Clinical Presentation due to:  degenerative changes Rt shoulder     Rehab Potential  Good    PT Frequency  2x / week    PT Duration  6 weeks    PT Treatment/Interventions  Patient/family education;ADLs/Self Care Home Management;Cryotherapy;Electrical  Stimulation;Iontophoresis 4mg /ml Dexamethasone;Moist Heat;Ultrasound;Dry needling;Manual techniques;Neuromuscular re-education;Therapeutic activities;Therapeutic exercise    PT Next Visit Plan  continue postural correction, manual therapy and ionto.     PT Home Exercise Plan   Access Code: [email or web address]     Consulted and Agree with Plan of Care  Patient       Patient will benefit from skilled therapeutic intervention in order to improve the following deficits and impairments:  Postural dysfunction, Improper body mechanics, Pain, Impaired sensation, Increased muscle spasms, Hypomobility, Decreased mobility, Decreased range of motion, Decreased strength, Decreased activity tolerance  Visit Diagnosis: Acute pain of right shoulder - Plan: PT plan of care cert/re-cert  Other symptoms and signs involving the musculoskeletal system - Plan: PT plan of care cert/re-cert  Abnormal posture - Plan: PT plan of care cert/re-cert     Problem List Patient Active Problem List   Diagnosis Date Noted  . Lesion of mouth 12/23/2017  . Esophageal dysphagia 12/23/2017  . Mastocytosis 12/23/2017  . Vitiligo 12/23/2017  . Rosacea 12/23/2017  . Hypothyroidism 12/23/2017  . Anxiety and depression 12/23/2017    Rendy Lazard Nilda Simmer PT, MPH  04/25/2018, 2:14 PM  [facility] [place], [place], [address] Phone: [phone]   Fax:  [phone]  Name: Kayla Lindsey MRN: [id number] Date of Birth: 1966-02-24

## 2018-04-25 NOTE — Patient Instructions (Signed)
Access Code: [email or web address]  URL: [email or web address]  Date: 04/25/2018  Prepared by: Gillermo Murdoch   Exercises  Seated Shoulder Flexion AAROM with Pulley Behind - 10 reps - 1 sets - 30 sec hold - 2x daily - 7x weekly  Seated Shoulder Scaption AAROM with Pulley at Side - 10 reps - 1 sets - 30 sec hold - 2x daily - 7x weekly  Seated Cervical Retraction - 10 reps - 1 sets - 3x daily - 7x weekly  Standing Scapular Retraction - 10 reps - 1 sets - 10 hold - 3x daily - 7x weekly  Seated Shoulder Flexion Slide at Table Top with Forearm in Neutral - 10 reps - 1 sets - 10 sec hold - 2x daily - 7x weekly  Seated Cervical Sidebending AROM - 3 reps - 1 sets - 10 sec hold - 3x daily - 7x weekly

## 2018-04-28 ENCOUNTER — Encounter: Payer: 59 | Admitting: Rehabilitative and Restorative Service Providers"

## 2018-05-02 ENCOUNTER — Encounter: Payer: Self-pay | Admitting: Rehabilitative and Restorative Service Providers"

## 2018-05-02 ENCOUNTER — Ambulatory Visit (INDEPENDENT_AMBULATORY_CARE_PROVIDER_SITE_OTHER): Payer: 59 | Admitting: Rehabilitative and Restorative Service Providers"

## 2018-05-02 DIAGNOSIS — R29898 Other symptoms and signs involving the musculoskeletal system: Secondary | ICD-10-CM | POA: Diagnosis not present

## 2018-05-02 DIAGNOSIS — M25511 Pain in right shoulder: Secondary | ICD-10-CM

## 2018-05-02 DIAGNOSIS — R293 Abnormal posture: Secondary | ICD-10-CM | POA: Diagnosis not present

## 2018-05-02 NOTE — Therapy (Signed)
[place], [place], [address] Phone: [phone]   Fax:  [phone]  Physical Therapy Treatment  Patient Details  Name: Kayla Lindsey MRN: [id number] Date of Birth: 1965/06/01 Referring Provider (PT): Dr Ophelia Charter    Encounter Date: 05/02/2018  PT End of Session - 05/02/18 1600    Visit Number  2    Number of Visits  12    Date for PT Re-Evaluation  06/06/18    PT Start Time  1600    PT Stop Time  1655    PT Time Calculation (min)  55 min    Activity Tolerance  Patient tolerated treatment well       Past Medical History:  Diagnosis Date  . Anxiety   . Depression   . Hashimoto's thyroiditis   . Hypothyroidism   . Mastocytosis   . Osteoarthritis    right shoulder    Past Surgical History:  Procedure Laterality Date  . APPENDECTOMY    . BREAST BIOPSY Bilateral   . BREAST LUMPECTOMY Right   . carpel tunnel    . CHOLECYSTECTOMY    . HIP SURGERY    . SHOULDER ACROMIOPLASTY      There were no vitals filed for this visit.  Subjective Assessment - 05/02/18 1601    Subjective  Shoulder hurts "all the time" Worse at night and stiff and hurting in the mornings. Nagging pain throughout the day. Some days worse than others.     Currently in Pain?  Yes    Pain Score  3     Pain Orientation  Right    Pain Descriptors / Indicators  Aching    Pain Type  Acute pain                       [facility] Adult PT Treatment/Exercise - 05/02/18 0001      Shoulder Exercises: Pulleys   Flexion  --   10 reps x 10 sec hold    Scaption  --   10 reps x 10 sec hold     Moist Heat Therapy   Number Minutes Moist Heat  15 Minutes    Moist Heat Location  Shoulder;Cervical   Rt shoulder girdle      Electrical Stimulation   Electrical Stimulation Location  Rt shoulder girdle     Electrical Stimulation Action  IFC    Electrical Stimulation Parameters  to tolerance    Electrical  Stimulation Goals  Pain;Tone      Manual Therapy   Manual therapy comments  pt sitting and supine/hooklying with bolster     Joint Mobilization  gentle shoulder circumduction    Soft tissue mobilization  deep tissue work through the Rt shoudler girdle - pecs; upper trap/leveator; teres; biceps; deltoid; periscapular musculature Rt shoulder - moderated pressure     Myofascial Release  upper trap    Scapular Mobilization  gentle scapular movement     Passive ROM  PROM Rt shoulder into flexion; ER(partial range)              PT Education - 05/02/18 1649    Education Details  encouraged decreased use Rt UE; rest; pulley; home ice/heat as needed     Person(s) Educated  Patient    Methods  Explanation    Comprehension  Verbalized understanding          PT Long Term Goals - 04/25/18 1411  PT LONG TERM GOAL #1   Title  Improve posture and alignment with patient to demonstrate improved upright posture with posterior shoulder girdle engaged 06/06/2018    Time  6    Period  Weeks    Status  New      PT LONG TERM GOAL #2   Title  Increased AROM Rt shoulder to =/> AROM Lt shoulder 06/06/2018    Time  6    Period  Weeks    Status  New      PT LONG TERM GOAL #3   Title  Decrease pain Rt shoulder allowing patient to return to normal functional activities with minimal to no pain or limitations 06/06/2018    Time  6    Period  Weeks    Status  New      PT LONG TERM GOAL #4   Title  Independent in HEP 06/06/2018    Time  6    Period  Weeks    Status  New      PT LONG TERM GOAL #5   Title  Improve FOTO to </= 49% limitation 06/06/2018    Time  6    Period  Weeks    Status  New            Plan - 05/02/18 1601    Clinical Impression Statement  Continued increased Rt shoulder pain which is constant in nature and worse than it was 1-2 weeks ago. She has continued to use Rt UE for functional and work activities Encouraged patient to decrease use of Rt UE and work on gentle  stretching. She has antiinflammatory at home but has not used it. Patient responded well to manual work and PROM followed by Bethany and e-stim.     Rehab Potential  Good    PT Frequency  2x / week    PT Duration  6 weeks    PT Treatment/Interventions  Patient/family education;ADLs/Self Care Home Management;Cryotherapy;Electrical Stimulation;Iontophoresis 4mg /ml Dexamethasone;Moist Heat;Ultrasound;Dry needling;Manual techniques;Neuromuscular re-education;Therapeutic activities;Therapeutic exercise    PT Next Visit Plan  continue postural correction,assess response to manual work and modalities; possible trial of ionto; continue with shoulder rehab per protocol      PT Home Exercise Plan   Access Code: [email or web address]     Consulted and Agree with Plan of Care  Patient       Patient will benefit from skilled therapeutic intervention in order to improve the following deficits and impairments:  Postural dysfunction, Improper body mechanics, Pain, Impaired sensation, Increased muscle spasms, Hypomobility, Decreased mobility, Decreased range of motion, Decreased strength, Decreased activity tolerance  Visit Diagnosis: Acute pain of right shoulder  Other symptoms and signs involving the musculoskeletal system  Abnormal posture     Problem List Patient Active Problem List   Diagnosis Date Noted  . Lesion of mouth 12/23/2017  . Esophageal dysphagia 12/23/2017  . Mastocytosis 12/23/2017  . Vitiligo 12/23/2017  . Rosacea 12/23/2017  . Hypothyroidism 12/23/2017  . Anxiety and depression 12/23/2017    Conrado Nance Nilda Simmer PT, MPH  05/02/2018, 4:55 PM  [facility] [place], [place], [address] Phone: [phone]   Fax:  [phone]  Name: Kayla Lindsey MRN: [id number] Date of Birth: May 09, 1965

## 2018-05-05 ENCOUNTER — Encounter: Payer: Self-pay | Admitting: Physical Therapy

## 2018-05-05 ENCOUNTER — Ambulatory Visit (INDEPENDENT_AMBULATORY_CARE_PROVIDER_SITE_OTHER): Payer: 59 | Admitting: Physical Therapy

## 2018-05-05 DIAGNOSIS — R29898 Other symptoms and signs involving the musculoskeletal system: Secondary | ICD-10-CM

## 2018-05-05 DIAGNOSIS — M25511 Pain in right shoulder: Secondary | ICD-10-CM | POA: Diagnosis not present

## 2018-05-05 DIAGNOSIS — R293 Abnormal posture: Secondary | ICD-10-CM

## 2018-05-05 NOTE — Therapy (Signed)
[place], [place], [address] Phone: [phone]   Fax:  [phone]  Physical Therapy Treatment  Patient Details  Name: Kayla Lindsey MRN: [id number] Date of Birth: 06-22-1965 Referring Provider (PT): Dr Ophelia Charter    Encounter Date: 05/05/2018  PT End of Session - 05/05/18 1614    Visit Number  3    Number of Visits  12    Date for PT Re-Evaluation  06/06/18    PT Start Time  1616    PT Stop Time  1709    PT Time Calculation (min)  53 min    Activity Tolerance  Patient tolerated treatment well    Behavior During Therapy  [facility] for tasks assessed/performed       Past Medical History:  Diagnosis Date  . Anxiety   . Depression   . Hashimoto's thyroiditis   . Hypothyroidism   . Mastocytosis   . Osteoarthritis    right shoulder    Past Surgical History:  Procedure Laterality Date  . APPENDECTOMY    . BREAST BIOPSY Bilateral   . BREAST LUMPECTOMY Right   . carpel tunnel    . CHOLECYSTECTOMY    . HIP SURGERY    . SHOULDER ACROMIOPLASTY      There were no vitals filed for this visit.  Subjective Assessment - 05/05/18 1620    Subjective  Pt reports she has been wearing sling for last 3 days and has limited use of her RUE (no chores).  This has helped decrease her pain significantly.  She has been taking Meloxicam, but it is bothering her stomach.     Patient Stated Goals  learn how to care for shoulder; increase ROM; stop pain     Currently in Pain?  Yes    Pain Score  0-No pain   up to 2/10 with shoulder  movement   Pain Location  Shoulder    Pain Orientation  Right    Aggravating Factors   using Rt UE    Pain Relieving Factors  rest; meds          [facility] PT Assessment - 05/05/18 0001      Assessment   Medical Diagnosis  Rt shoulder pain    Referring Provider (PT)  Dr Ophelia Charter     Onset Date/Surgical Date  04/07/18   onset of pain 02/06/18; pain for ~ 8 yrs    Hand  Dominance  Right    Next MD Visit  4 wks     Prior Therapy  yes after prior surgeries and here prior to recent surgery         [facility] Adult PT Treatment/Exercise - 05/05/18 0001      Self-Care   Self-Care  Other Self-Care Comments    Other Self-Care Comments   pt instructed in MFR to platysma with gentle rotation of head ( to assist in decrease of Rt neck tightness); pt returned demo with cues.  Pt enocuraged to also perform self massage to Rt pec / posterior shoulder girlde with ball; pt verbalized understanding.       Exercises   Exercises  Shoulder      Shoulder Exercises: Seated   Retraction  Both;10 reps   5 sec   Other Seated Exercises  W's with scap restraction x 5 sec x 5 reps; shoulder rolls x 10 reps backward/ forward.       Moist Heat Therapy   Number Minutes Moist  Heat  15 Minutes    Moist Heat Location  Shoulder;Cervical   Rt shoulder girdle      Electrical Stimulation   Electrical Stimulation Location  Rt shoulder girdle     Electrical Stimulation Action  IFC    Electrical Stimulation Parameters  intensity to pt tolerance x 15 min     Electrical Stimulation Goals  Tone;Pain      Manual Therapy   Manual therapy comments  pt supine/hooklying with bolster     Joint Mobilization  gentle shoulder circumduction    Soft tissue mobilization  deep tissue work through the [employer] girdle - pecs; upper trap/leveator; teres; biceps; deltoid; periscapular musculature Rt shoulder - moderated pressure     Myofascial Release  Rt pec minor/major, lat      Scapular Mobilization  gentle scapular movement     Passive ROM  PROM Rt shoulder into flexion; ER, ext.                    PT Long Term Goals - 04/25/18 1411      PT LONG TERM GOAL #1   Title  Improve posture and alignment with patient to demonstrate improved upright posture with posterior shoulder girdle engaged 06/06/2018    Time  6    Period  Weeks    Status  New      PT LONG TERM GOAL #2   Title   Increased AROM Rt shoulder to =/> AROM Lt shoulder 06/06/2018    Time  6    Period  Weeks    Status  New      PT LONG TERM GOAL #3   Title  Decrease pain Rt shoulder allowing patient to return to normal functional activities with minimal to no pain or limitations 06/06/2018    Time  6    Period  Weeks    Status  New      PT LONG TERM GOAL #4   Title  Independent in HEP 06/06/2018    Time  6    Period  Weeks    Status  New      PT LONG TERM GOAL #5   Title  Improve FOTO to </= 49% limitation 06/06/2018    Time  6    Period  Weeks    Status  New            Plan - 05/05/18 1709    Clinical Impression Statement  Fascial tightness noted in Rt pec maj/minor, lat, levator and upper thoracic paraspinals.  Improve tissue extensibility after manual therapy; pt responded well.  Goals are ongoing at this time.     Rehab Potential  Good    PT Frequency  2x / week    PT Duration  6 weeks    PT Treatment/Interventions  Patient/family education;ADLs/Self Care Home Management;Cryotherapy;Electrical Stimulation;Iontophoresis 4mg /ml Dexamethasone;Moist Heat;Ultrasound;Dry needling;Manual techniques;Neuromuscular re-education;Therapeutic activities;Therapeutic exercise    PT Next Visit Plan  continue postural correction,assess response to manual work and modalities; possible trial of ionto; continue with shoulder rehab per protocol      Consulted and Agree with Plan of Care  Patient       Patient will benefit from skilled therapeutic intervention in order to improve the following deficits and impairments:  Postural dysfunction, Improper body mechanics, Pain, Impaired sensation, Increased muscle spasms, Hypomobility, Decreased mobility, Decreased range of motion, Decreased strength, Decreased activity tolerance  Visit Diagnosis: Acute pain of right shoulder  Other symptoms and signs involving  the musculoskeletal system  Abnormal posture     Problem List Patient Active Problem List    Diagnosis Date Noted  . Lesion of mouth 12/23/2017  . Esophageal dysphagia 12/23/2017  . Mastocytosis 12/23/2017  . Vitiligo 12/23/2017  . Rosacea 12/23/2017  . Hypothyroidism 12/23/2017  . Anxiety and depression 12/23/2017   Kerin Perna, PTA 05/05/18 5:23 PM  [place], [place], [address] Phone: [phone]   Fax:  [phone]  Name: Kayla Lindsey MRN: [id number] Date of Birth: 1966/01/22

## 2018-05-09 ENCOUNTER — Ambulatory Visit (INDEPENDENT_AMBULATORY_CARE_PROVIDER_SITE_OTHER): Payer: 59 | Admitting: Physical Therapy

## 2018-05-09 ENCOUNTER — Encounter: Payer: Self-pay | Admitting: Physical Therapy

## 2018-05-09 DIAGNOSIS — R293 Abnormal posture: Secondary | ICD-10-CM

## 2018-05-09 DIAGNOSIS — R29898 Other symptoms and signs involving the musculoskeletal system: Secondary | ICD-10-CM | POA: Diagnosis not present

## 2018-05-09 DIAGNOSIS — M25511 Pain in right shoulder: Secondary | ICD-10-CM

## 2018-05-09 NOTE — Therapy (Signed)
[place], [place], [address] Phone: [phone]   Fax:  [phone]  Physical Therapy Treatment  Patient Details  Name: Kayla Lindsey MRN: [id number] Date of Birth: 02/11/1966 Referring Provider (PT): Dr Ophelia Charter    Encounter Date: 05/09/2018  PT End of Session - 05/09/18 1531    Visit Number  4    Number of Visits  12    Date for PT Re-Evaluation  06/06/18    PT Start Time  1519    PT Stop Time  1615    PT Time Calculation (min)  56 min    Activity Tolerance  Patient tolerated treatment well    Behavior During Therapy  [facility] for tasks assessed/performed       Past Medical History:  Diagnosis Date  . Anxiety   . Depression   . Hashimoto's thyroiditis   . Hypothyroidism   . Mastocytosis   . Osteoarthritis    right shoulder    Past Surgical History:  Procedure Laterality Date  . APPENDECTOMY    . BREAST BIOPSY Bilateral   . BREAST LUMPECTOMY Right   . carpel tunnel    . CHOLECYSTECTOMY    . HIP SURGERY    . SHOULDER ACROMIOPLASTY      There were no vitals filed for this visit.  Subjective Assessment - 05/09/18 1528    Subjective  Pt reports she was really sore the remainder of day on Thurs; but took Meloxicam and woke up with no pain.  Pt no longer taking pain medication.  She is using her RUE sparingly.  She walks dogs with LUE.    Patient Stated Goals  learn how to care for shoulder; increase ROM; stop pain     Currently in Pain?  No/denies         [facility] PT Assessment - 05/09/18 0001      Assessment   Medical Diagnosis  Rt shoulder pain    Referring Provider (PT)  Dr Ophelia Charter     Onset Date/Surgical Date  04/07/18   onset of pain 02/06/18; pain for ~ 8 yrs    Hand Dominance  Right    Next MD Visit  05/13/18    Prior Therapy  yes after prior surgeries and here prior to recent surgery       AROM   Right Shoulder Extension  42 Degrees    Right Shoulder Flexion  150  Degrees    Right Shoulder Internal Rotation  --   palm to buttocks   Right Shoulder External Rotation  90 Degrees        [facility] Adult PT Treatment/Exercise - 05/09/18 0001      Shoulder Exercises: Standing   Row  Strengthening;Both;10 reps;Theraband    Theraband Level (Shoulder Row)  Level 1 (Yellow)    Other Standing Exercises  shoulder rolls forward/backward x 10       Shoulder Exercises: Pulleys   Flexion  --   10 reps x 10 sec hold      Shoulder Exercises: Isometric Strengthening   Flexion  --   5" x 10 reps   Extension  --   5" x 10 reps   External Rotation  --   5"x 10   Internal Rotation  --   5 sec x 10   ABduction  --   5" x 10     Shoulder Exercises: Stretch   Internal Rotation Stretch  10 seconds  5 reps, AAROM from LUE     Moist Heat Therapy   Number Minutes Moist Heat  15 Minutes    Moist Heat Location  Shoulder;Cervical   Rt shoulder girdle      Electrical Stimulation   Electrical Stimulation Location  Rt shoulder girdle     Electrical Stimulation Action  IFC    Electrical Stimulation Parameters  intensity to pt tolerance x 15 min     Electrical Stimulation Goals  Tone;Pain      Manual Therapy   Manual therapy comments  pt supine/hooklying with bolster     Soft tissue mobilization  STM to posterior shoulder girdle (Rt) and pec    Myofascial Release  Rt pec minor/major, lat      Scapular Mobilization  gentle scapular movement     Passive ROM  PROM Rt shoulder into ext; ER                    PT Long Term Goals - 04/25/18 1411      PT LONG TERM GOAL #1   Title  Improve posture and alignment with patient to demonstrate improved upright posture with posterior shoulder girdle engaged 06/06/2018    Time  6    Period  Weeks    Status  New      PT LONG TERM GOAL #2   Title  Increased AROM Rt shoulder to =/> AROM Lt shoulder 06/06/2018    Time  6    Period  Weeks    Status  New      PT LONG TERM GOAL #3   Title  Decrease pain Rt  shoulder allowing patient to return to normal functional activities with minimal to no pain or limitations 06/06/2018    Time  6    Period  Weeks    Status  New      PT LONG TERM GOAL #4   Title  Independent in HEP 06/06/2018    Time  6    Period  Weeks    Status  New      PT LONG TERM GOAL #5   Title  Improve FOTO to </= 49% limitation 06/06/2018    Time  6    Period  Weeks    Status  New            Plan - 05/09/18 1532    Clinical Impression Statement  Pt able to tolerate new exercises, only reporting minor soreness with abdct isometric (excluded this from HEP for now). Improved Rt shoulder AROM.  Progressing well towards goals.     Rehab Potential  Good    PT Frequency  2x / week    PT Duration  6 weeks    PT Treatment/Interventions  Patient/family education;ADLs/Self Care Home Management;Cryotherapy;Electrical Stimulation;Iontophoresis 4mg /ml Dexamethasone;Moist Heat;Ultrasound;Dry needling;Manual techniques;Neuromuscular re-education;Therapeutic activities;Therapeutic exercise    PT Next Visit Plan  MD note for upcoming appt; FOTO.        Patient will benefit from skilled therapeutic intervention in order to improve the following deficits and impairments:  Postural dysfunction, Improper body mechanics, Pain, Impaired sensation, Increased muscle spasms, Hypomobility, Decreased mobility, Decreased range of motion, Decreased strength, Decreased activity tolerance  Visit Diagnosis: Acute pain of right shoulder  Other symptoms and signs involving the musculoskeletal system  Abnormal posture     Problem List Patient Active Problem List   Diagnosis Date Noted  . Lesion of mouth 12/23/2017  . Esophageal dysphagia 12/23/2017  . Mastocytosis 12/23/2017  .  Vitiligo 12/23/2017  . Rosacea 12/23/2017  . Hypothyroidism 12/23/2017  . Anxiety and depression 12/23/2017   Kerin Perna, PTA 05/09/18 5:12 PM  [place], [place], [address] Phone: [phone]   Fax:  [phone]  Name: ARLINA SABINA MRN: [id number] Date of Birth: [birth year]

## 2018-05-09 NOTE — Patient Instructions (Signed)
Strengthening: Isometric Flexion  Using wall for resistance, press right fist into ball using light pressure. Hold _5-10___ seconds. Repeat _10___ times per set. . Do __1-2__ sessions per day.  Extension (Isometric)  Place left bent elbow and back of arm against wall. Press elbow against wall. Hold __5-10__ seconds. Repeat _10___ times. Do __1-2__ sessions per day.  Internal Rotation (Isometric)  Place palm of right fist against door frame, with elbow bent. Press fist against door frame. Hold _5-10__ seconds. Repeat _10___ times. Do _1-2___ sessions per day.  External Rotation (Isometric)  Place back of left fist against door frame, with elbow bent. Press fist against door frame. Hold __5-10__ seconds. Repeat __10__ times. Do __1-2__ sessions per day.  Cane Exercise: Extension    Stand holding cane behind back with both hands palm-up. Lift the cane away from body. Hold __5__ seconds. Repeat __10__ times. Do _1___ sessions per day.   [facility] [place] [address], [place] [address]  [phone] (office) [phone] (fax)

## 2018-05-12 ENCOUNTER — Encounter: Payer: 59 | Admitting: Rehabilitative and Restorative Service Providers"

## 2018-05-16 ENCOUNTER — Ambulatory Visit (INDEPENDENT_AMBULATORY_CARE_PROVIDER_SITE_OTHER): Payer: 59 | Admitting: Physical Therapy

## 2018-05-16 DIAGNOSIS — M25511 Pain in right shoulder: Secondary | ICD-10-CM | POA: Diagnosis not present

## 2018-05-16 DIAGNOSIS — R29898 Other symptoms and signs involving the musculoskeletal system: Secondary | ICD-10-CM | POA: Diagnosis not present

## 2018-05-16 DIAGNOSIS — R293 Abnormal posture: Secondary | ICD-10-CM | POA: Diagnosis not present

## 2018-05-16 NOTE — Therapy (Addendum)
[facility] [place], [place], [address] Phone: [phone]   Fax:  [phone]  Physical Therapy Treatment  Patient Details  Name: Kayla Lindsey MRN: [id number] Date of Birth: [birth year] Referring Provider (PT): Dr Ophelia Charter    Encounter Date: 05/16/2018  PT End of Session - 05/16/18 1603    Visit Number  5    Number of Visits  12    Date for PT Re-Evaluation  06/06/18    PT Start Time  7681    PT Stop Time  1555    PT Time Calculation (min)  40 min    Activity Tolerance  Patient tolerated treatment well;No increased pain    Behavior During Therapy  WFL for tasks assessed/performed       Past Medical History:  Diagnosis Date  . Anxiety   . Depression   . Hashimoto's thyroiditis   . Hypothyroidism   . Mastocytosis   . Osteoarthritis    right shoulder    Past Surgical History:  Procedure Laterality Date  . APPENDECTOMY    . BREAST BIOPSY Bilateral   . BREAST LUMPECTOMY Right   . carpel tunnel    . CHOLECYSTECTOMY    . HIP SURGERY    . SHOULDER ACROMIOPLASTY      There were no vitals filed for this visit.  Subjective Assessment - 05/16/18 1520    Subjective  Pt reports that things are going really well.  "I just can't vacuum, or walk the dog... otherwise I have pain afterwards".  Pt is avoiding abdct which is still painful.  No longer taking Meloxicam (stomach issues); just taking tylenol after vacuuming.     Patient Stated Goals  learn how to care for shoulder; increase ROM; stop pain     Currently in Pain?  No/denies    Pain Score  0-No pain    Pain Location  Shoulder    Pain Orientation  Right    Pain Descriptors / Indicators  Aching         [facility] PT Assessment - 05/16/18 0001      Assessment   Medical Diagnosis  Rt shoulder pain    Referring Provider (PT)  Dr Ophelia Charter     Onset Date/Surgical Date  04/07/18   onset of pain 02/06/18; pain for ~ 8 yrs    Hand Dominance   Right    Next MD Visit  05/20/18    Prior Therapy  yes after prior surgeries and here prior to recent surgery       Observation/Other Assessments   Focus on Therapeutic Outcomes (FOTO)   38% limited (37% goal).       AROM   Right Shoulder Extension  42 Degrees    Right Shoulder Flexion  160 Degrees    Right Shoulder ABduction  150 Degrees    Right Shoulder Internal Rotation  --   thumb to L4   Right Shoulder External Rotation  90 Degrees      [facility] Adult PT Treatment/Exercise - 05/16/18 0001      Shoulder Exercises: Standing   Flexion  Right;10 reps;Theraband   forward punch, with good scapula positioning   Theraband Level (Shoulder Flexion)  Level 1 (Yellow)    Extension  Strengthening;Both;20 reps;Theraband    Theraband Level (Shoulder Extension)  Level 1 (Yellow)    Row  Strengthening;Both;Theraband;20 reps    Theraband Level (Shoulder Row)  Level 1 (Yellow)    Other Standing Exercises  shoulder rolls forward/backward x 10     Other Standing Exercises  pendulum x 10       Shoulder Exercises: Pulleys   Flexion  --   10 reps x 10 sec hold      Shoulder Exercises: Isometric Strengthening   Flexion  --   10" x 10 reps   Extension  --   10" x 10 reps   External Rotation  --   10"x 10   Internal Rotation  --   10 sec x 10   ABduction  --   held      Shoulder Exercises: Stretch   Internal Rotation Stretch  10 seconds   5 reps, AAROM from LUE   Other Shoulder Stretches  bilat shoulder ext stretch holding band behind back x 5 sec hold x 10 reps      Modalities   Modalities  --   declined     Moist Heat Therapy   Number Minutes Moist Heat  --    Moist Heat Location  --      Acupuncturist Location  --    Printmaker Action  --    Electrical Stimulation Parameters  --    Electrical Stimulation Goals  --      Manual Therapy   Manual therapy comments  pt supine/hooklying with bolster     Soft tissue mobilization  STM to  posterior shoulder girdle (Rt) and pec    Myofascial Release  Rt pec minor/major, lat      Scapular Mobilization  gentle scapular movement     Passive ROM  PROM Rt shoulder into ext                  PT Long Term Goals - 05/16/18 1624      PT LONG TERM GOAL #1   Title  Improve posture and alignment with patient to demonstrate improved upright posture with posterior shoulder girdle engaged 06/06/2018    Time  6    Period  Weeks    Status  Partially Met      PT LONG TERM GOAL #2   Title  Increased AROM Rt shoulder to =/> AROM Lt shoulder 06/06/2018    Time  6    Period  Weeks    Status  Partially Met      PT LONG TERM GOAL #3   Title  Decrease pain Rt shoulder allowing patient to return to normal functional activities with minimal to no pain or limitations 06/06/2018    Time  6    Period  Weeks    Status  Partially Met      PT LONG TERM GOAL #4   Title  Independent in HEP 06/06/2018    Time  6    Period  Weeks    Status  On-going      PT LONG TERM GOAL #5   Title  Improve FOTO to </= 49% limitation 06/06/2018    Time  6    Period  Weeks    Status  On-going            Plan - 05/16/18 1622    Clinical Impression Statement  Pt demonstrated improved Rt shoulder ROM; only limited with IR.  Pt tolerating exercises within protocol (gentle isometrics along with light bands for row/ext).  Pt now 6 wks s/p SAD,DCE.  Pt requests to decrease freq to 1x/wk.  Pt has partially met her goals and is  making good gains each visit..    Rehab Potential  Good    PT Frequency  2x / week    PT Duration  6 weeks    PT Treatment/Interventions  Patient/family education;ADLs/Self Care Home Management;Cryotherapy;Electrical Stimulation;Iontophoresis '4mg'$ /ml Dexamethasone;Moist Heat;Ultrasound;Dry needling;Manual techniques;Neuromuscular re-education;Therapeutic activities;Therapeutic exercise    PT Next Visit Plan  continue per protocol.     PT Home Exercise Plan   Access Code: [email or web address]      Consulted and Agree with Plan of Care  Patient       Patient will benefit from skilled therapeutic intervention in order to improve the following deficits and impairments:  Postural dysfunction, Improper body mechanics, Pain, Impaired sensation, Increased muscle spasms, Hypomobility, Decreased mobility, Decreased range of motion, Decreased strength, Decreased activity tolerance  Visit Diagnosis: Acute pain of right shoulder  Other symptoms and signs involving the musculoskeletal system  Abnormal posture     Problem List Patient Active Problem List   Diagnosis Date Noted  . Lesion of mouth 12/23/2017  . Esophageal dysphagia 12/23/2017  . Mastocytosis 12/23/2017  . Vitiligo 12/23/2017  . Rosacea 12/23/2017  . Hypothyroidism 12/23/2017  . Anxiety and depression 12/23/2017   Kerin Perna, PTA 05/16/18 4:28 PM  [facility] [place], [place], [address] Phone: [phone]   Fax:  [phone]  Name: Kayla Lindsey MRN: [id number] Date of Birth: 1965-08-21  PHYSICAL THERAPY DISCHARGE SUMMARY  Visits from Start of Care: 5  Current functional level related to goals / functional outcomes: See last progress note for discharge status    Remaining deficits: Pain and shoulder dysfunction - bone on bone - awaiting TSA    Education / Equipment: HEP  Plan: Patient agrees to discharge.  Patient goals were partially met. Patient is being discharged due to being pleased with the current functional level.  ?????     Celyn P. Helene Kelp PT, MPH 07/06/18 3:12 PM

## 2018-05-19 ENCOUNTER — Encounter: Payer: 59 | Admitting: Rehabilitative and Restorative Service Providers"

## 2018-05-19 MED FILL — SODIUM SULFACETAMIDE 10% WA: 10 | 25 days supply | Qty: 177 | Fill #1

## 2018-05-19 MED FILL — AZELAIC ACID 15 % GEL: 15 | 30 days supply | Qty: 50 | Fill #0

## 2018-05-20 DIAGNOSIS — M19011 Primary osteoarthritis, right shoulder: Secondary | ICD-10-CM | POA: Diagnosis not present

## 2018-05-23 ENCOUNTER — Encounter: Payer: 59 | Admitting: Physical Therapy

## 2018-05-26 ENCOUNTER — Encounter: Payer: 59 | Admitting: Rehabilitative and Restorative Service Providers"

## 2018-05-26 MED FILL — CELECOXIB 100 MG CAPS: 100 | 30 days supply | Qty: 60 | Fill #0

## 2018-05-27 ENCOUNTER — Telehealth: Payer: Self-pay | Admitting: Physical Therapy

## 2018-05-27 NOTE — Telephone Encounter (Signed)
Called patient to inform her that her appt at [facility] for 05/30/18 has been cancelled.  Requested patient call back for more information and to reschedule.  Left number for [facility] rehab.    Kerin Perna, PTA 05/27/18 2:37 PM

## 2018-05-30 ENCOUNTER — Encounter: Payer: 59 | Admitting: Physical Therapy

## 2018-06-02 ENCOUNTER — Encounter: Payer: 59 | Admitting: Rehabilitative and Restorative Service Providers"

## 2018-06-16 ENCOUNTER — Telehealth: Payer: Self-pay | Admitting: Rehabilitative and Restorative Service Providers"

## 2018-06-16 NOTE — Telephone Encounter (Signed)
LVM for patient to check on her status and see if she needed to schedule additional PT visits for shoulder rehab. Ask that Maudie Mercury call us with a status update. We are seeing patient in OP Rehab on a priority basis.   Everardo All, PT, MPH  Acute Rehab [phone]

## 2018-06-22 MED FILL — LEVOTHYROXINE 125 MCG TAB: 125 | 90 days supply | Qty: 90 | Fill #0

## 2018-06-23 ENCOUNTER — Encounter: Payer: Self-pay | Admitting: Osteopathic Medicine

## 2018-06-23 DIAGNOSIS — M25511 Pain in right shoulder: Secondary | ICD-10-CM | POA: Insufficient documentation

## 2018-07-05 DIAGNOSIS — M19011 Primary osteoarthritis, right shoulder: Secondary | ICD-10-CM | POA: Diagnosis not present

## 2018-08-25 ENCOUNTER — Other Ambulatory Visit: Payer: Self-pay | Admitting: Orthopaedic Surgery

## 2018-08-25 DIAGNOSIS — M25511 Pain in right shoulder: Secondary | ICD-10-CM

## 2018-08-25 DIAGNOSIS — G8929 Other chronic pain: Secondary | ICD-10-CM

## 2018-09-07 ENCOUNTER — Other Ambulatory Visit: Payer: Self-pay

## 2018-09-07 ENCOUNTER — Ambulatory Visit
Admission: RE | Admit: 2018-09-07 | Discharge: 2018-09-07 | Disposition: A | Discharge status: Home / Self Care | Payer: 59 | Source: Ambulatory Visit | Attending: Orthopaedic Surgery | Admitting: Orthopaedic Surgery

## 2018-09-07 DIAGNOSIS — G8929 Other chronic pain: Secondary | ICD-10-CM

## 2018-09-07 DIAGNOSIS — M19011 Primary osteoarthritis, right shoulder: Secondary | ICD-10-CM | POA: Diagnosis not present

## 2018-09-08 NOTE — Patient Instructions (Addendum)
YOU NEED TO HAVE A COVID 19 TEST ON__Sat _____ @_9 :15______, THIS TEST MUST BE DONE BEFORE SURGERY, COME TO [facility] ENTRANCE. ONCE YOUR COVID TEST IS COMPLETED, PLEASE BEGIN THE QUARANTINE INSTRUCTIONS AS OUTLINED IN YOUR HANDOUT.                Kayla Lindsey     Your procedure is scheduled on: 09-21-2018   Report to [facility]  Entrance  Report to admitting at 9:30 AM      Call this number if you have problems the morning of surgery [phone]     Remember: [place], NO [place].    NO SOLID FOOD AFTER MIDNIGHT THE NIGHT PRIOR TO SURGERY.  NOTHING BY MOUTH EXCEPT CLEAR LIQUIDS UNTIL 9:00 am.  PLEASE FINISH ENSURE DRINK PER SURGEON ORDER 3 HOURS PRIOR TO SCHEDULED SURGERY TIME WHICH NEEDS TO BE COMPLETED AT 9:00 am.  CLEAR LIQUID DIET   Foods Allowed                                                                     Foods Excluded  Coffee and tea, regular and decaf                             liquids that you cannot  Plain Jell-O in any flavor                                             see through such as: Fruit ices (not with fruit pulp)                                     milk, soups, orange juice  Iced Popsicles                                    All solid food Carbonated beverages, regular and diet                                    Cranberry, grape and apple juices Sports drinks like Gatorade Lightly seasoned clear broth or consume(fat free) Sugar, honey syrup  Sample Menu Breakfast                                Lunch                                     Supper Cranberry juice                    Beef broth  Chicken broth Jell-O                                     Grape juice                           Apple juice Coffee or tea                        Jell-O                                      Popsicle                                                 Coffee or tea                        Coffee or tea  _____________________________________________________________________     Take these medicines the morning of surgery with A SIP OF WATER:  Effexor XR, Allegra, Synthroid                                  You may not have any metal on your body including hair pins and              piercings  Do not wear jewelry, make-up, lotions, powders or perfumes, deodorant             Do not wear nail polish.  Do not shave  48 hours prior to surgery.             Do not bring valuables to the hospital. [place].  Contacts, dentures or bridgework may not be worn into surgery.  Leave suitcase in the car. After surgery it may be brought to your room.      _____________________________________________________________________             [facility] - Preparing for Surgery Before surgery, you can play an important role.  Because skin is not sterile, your skin needs to be as free of germs as possible.  You can reduce the number of germs on your skin by washing with CHG (chlorahexidine gluconate) soap before surgery.  CHG is an antiseptic cleaner which kills germs and bonds with the skin to continue killing germs even after washing. Please DO NOT use if you have an allergy to CHG or antibacterial soaps.  If your skin becomes reddened/irritated stop using the CHG and inform your nurse when you arrive at Short Stay. Do not shave (including legs and underarms) for at least 48 hours prior to the first CHG shower.  You may shave your face/neck. Please follow these instructions carefully:  1.  Shower with CHG Soap the night before surgery and the  morning of Surgery.  2.  If you choose to wash your hair, wash your hair first as usual with your  normal  shampoo.  3.  After you shampoo, rinse your hair and  body thoroughly to remove the  shampoo.                           4.  Use CHG as  you would any other liquid soap.  You can apply chg directly  to the skin and wash                       Gently with a scrungie or clean washcloth.  5.  Apply the CHG Soap to your body ONLY FROM THE NECK DOWN.   Do not use on face/ open                           Wound or open sores. Avoid contact with eyes, ears mouth and genitals (private parts).                       Wash face,  Genitals (private parts) with your normal soap.             6.  Wash thoroughly, paying special attention to the area where your surgery  will be performed.  7.  Thoroughly rinse your body with warm water from the neck down.  8.  DO NOT shower/wash with your normal soap after using and rinsing off  the CHG Soap.                9.  Pat yourself dry with a clean towel.            10.  Wear clean pajamas.            11.  Place clean sheets on your bed the night of your first shower and do not  sleep with pets. Day of Surgery : Do not apply any lotions/deodorants the morning of surgery.  Please wear clean clothes to the hospital/surgery center.  FAILURE TO FOLLOW THESE INSTRUCTIONS MAY RESULT IN THE CANCELLATION OF YOUR SURGERY PATIENT SIGNATURE_________________________________  NURSE SIGNATURE__________________________________  ________________________________________________________________________   Adam Phenix  An incentive spirometer is a tool that can help keep your lungs clear and active. This tool measures how well you are filling your lungs with each breath. Taking long deep breaths may help reverse or decrease the chance of developing breathing (pulmonary) problems (especially infection) following:  A long period of time when you are unable to move or be active. BEFORE THE PROCEDURE   If the spirometer includes an indicator to show your best effort, your nurse or respiratory therapist will set it to a desired goal.  If possible, sit up straight or lean slightly forward. Try not to  slouch.  Hold the incentive spirometer in an upright position. INSTRUCTIONS FOR USE  1. Sit on the edge of your bed if possible, or sit up as far as you can in bed or on a chair. 2. Hold the incentive spirometer in an upright position. 3. Breathe out normally. 4. Place the mouthpiece in your mouth and seal your lips tightly around it. 5. Breathe in slowly and as deeply as possible, raising the piston or the ball toward the top of the column. 6. Hold your breath for 3-5 seconds or for as long as possible. Allow the piston or ball to fall to the bottom of the column. 7. Remove the mouthpiece from your mouth and breathe out normally. 8. Rest for a few seconds and repeat Steps 1  through 7 at least 10 times every 1-2 hours when you are awake. Take your time and take a few normal breaths between deep breaths. 9. The spirometer may include an indicator to show your best effort. Use the indicator as a goal to work toward during each repetition. 10. After each set of 10 deep breaths, practice coughing to be sure your lungs are clear. If you have an incision (the cut made at the time of surgery), support your incision when coughing by placing a pillow or rolled up towels firmly against it. Once you are able to get out of bed, walk around indoors and cough well. You may stop using the incentive spirometer when instructed by your caregiver.  RISKS AND COMPLICATIONS  Take your time so you do not get dizzy or light-headed.  If you are in pain, you may need to take or ask for pain medication before doing incentive spirometry. It is harder to take a deep breath if you are having pain. AFTER USE  Rest and breathe slowly and easily.  It can be helpful to keep track of a log of your progress. Your caregiver can provide you with a simple table to help with this. If you are using the spirometer at home, follow these instructions: [place] IF:   You are having difficultly using the spirometer.  You  have trouble using the spirometer as often as instructed.  Your pain medication is not giving enough relief while using the spirometer.  You develop fever of 100.5 F (38.1 C) or higher. SEEK IMMEDIATE MEDICAL CARE IF:   You cough up bloody sputum that had not been present before.  You develop fever of 102 F (38.9 C) or greater.  You develop worsening pain at or near the incision site. MAKE SURE YOU:   Understand these instructions.  Will watch your condition.  Will get help right away if you are not doing well or get worse. Document Released: 07/06/2006 Document Revised: 05/18/2011 Document Reviewed: 09/06/2006 [facility] Patient Information 2014 [place], [place].   ________________________________________________________________________

## 2018-09-12 ENCOUNTER — Encounter (HOSPITAL_COMMUNITY)
Admission: RE | Admit: 2018-09-12 | Discharge: 2018-09-12 | Disposition: A | Discharge status: Home / Self Care | Payer: 59 | Source: Ambulatory Visit | Attending: Orthopaedic Surgery | Admitting: Orthopaedic Surgery

## 2018-09-12 ENCOUNTER — Encounter (HOSPITAL_COMMUNITY): Payer: Self-pay

## 2018-09-12 ENCOUNTER — Encounter: Payer: Self-pay | Admitting: Osteopathic Medicine

## 2018-09-12 ENCOUNTER — Other Ambulatory Visit: Payer: Self-pay

## 2018-09-12 DIAGNOSIS — M25511 Pain in right shoulder: Secondary | ICD-10-CM | POA: Insufficient documentation

## 2018-09-12 DIAGNOSIS — M19011 Primary osteoarthritis, right shoulder: Secondary | ICD-10-CM | POA: Insufficient documentation

## 2018-09-12 DIAGNOSIS — Z01812 Encounter for preprocedural laboratory examination: Secondary | ICD-10-CM | POA: Diagnosis not present

## 2018-09-12 LAB — CBC
HCT: 42.2 % (ref 36.0–46.0)
Hemoglobin: 13.4 g/dL (ref 12.0–15.0)
MCH: 29.1 pg (ref 26.0–34.0)
MCHC: 31.8 g/dL (ref 30.0–36.0)
MCV: 91.5 fL (ref 80.0–100.0)
Platelets: 339 10*3/uL (ref 150–400)
RBC: 4.61 MIL/uL (ref 3.87–5.11)
RDW: 12.5 % (ref 11.5–15.5)
WBC: 8 10*3/uL (ref 4.0–10.5)
nRBC: 0 % (ref 0.0–0.2)

## 2018-09-12 LAB — SURGICAL PCR SCREEN
MRSA, PCR: NEGATIVE
Staphylococcus aureus: NEGATIVE

## 2018-09-13 ENCOUNTER — Ambulatory Visit (INDEPENDENT_AMBULATORY_CARE_PROVIDER_SITE_OTHER): Payer: 59

## 2018-09-13 ENCOUNTER — Encounter (HOSPITAL_COMMUNITY): Payer: Self-pay | Admitting: Physician Assistant

## 2018-09-13 ENCOUNTER — Ambulatory Visit (INDEPENDENT_AMBULATORY_CARE_PROVIDER_SITE_OTHER): Payer: 59 | Admitting: Osteopathic Medicine

## 2018-09-13 ENCOUNTER — Encounter: Payer: Self-pay | Admitting: Osteopathic Medicine

## 2018-09-13 VITALS — BP 112/82 | HR 100 | Temp 97.6°F | Wt 167.0 lb

## 2018-09-13 DIAGNOSIS — M79641 Pain in right hand: Secondary | ICD-10-CM | POA: Diagnosis not present

## 2018-09-13 DIAGNOSIS — M2560 Stiffness of unspecified joint, not elsewhere classified: Secondary | ICD-10-CM | POA: Diagnosis not present

## 2018-09-13 DIAGNOSIS — R5382 Chronic fatigue, unspecified: Secondary | ICD-10-CM

## 2018-09-13 DIAGNOSIS — E039 Hypothyroidism, unspecified: Secondary | ICD-10-CM | POA: Diagnosis not present

## 2018-09-13 DIAGNOSIS — M25542 Pain in joints of left hand: Secondary | ICD-10-CM | POA: Diagnosis not present

## 2018-09-13 DIAGNOSIS — L80 Vitiligo: Secondary | ICD-10-CM

## 2018-09-13 DIAGNOSIS — M79642 Pain in left hand: Secondary | ICD-10-CM | POA: Diagnosis not present

## 2018-09-13 DIAGNOSIS — M791 Myalgia, unspecified site: Secondary | ICD-10-CM | POA: Diagnosis not present

## 2018-09-13 DIAGNOSIS — M25541 Pain in joints of right hand: Secondary | ICD-10-CM | POA: Diagnosis not present

## 2018-09-13 NOTE — Patient Instructions (Signed)
Will get Xrays and labs Let's schedule a visit next week to go over everything in detail  I suspect possible Rheumatoid Arthritis   Rheumatoid Arthritis Rheumatoid arthritis (RA) is a long-term (chronic) disease that causes inflammation in your joints. RA may start slowly. It most often affects the small joints of the hands and feet. Usually, the same joints are affected on both sides of your body. Inflammation from RA can also affect other parts of your body, including your heart, eyes, or lungs. There is no cure for RA, but medicines can help your symptoms and halt or slow down the progression of the disease. What are the causes? RA is an autoimmune disease. When you have an autoimmune disease, your body's defense system (immune system) mistakenly attacks healthy body tissues. The exact cause of RA is not known. What increases the risk? You are more likely to develop this condition if you:  Are a woman.  Have a family history of of RA or other autoimmune diseases.  Have a history of smoking.  Are obese.  Have been exposed to pollutants or chemicals. What are the signs or symptoms? The first symptom of this condition may be morning stiffness that lasts longer than 30 minutes.  Symptoms usually start gradually. They are often worse in the morning. As RA progresses, symptoms may include:  Pain, stiffness, swelling, warmth, and tenderness in joints on both sides of your body.  Loss of energy.  Loss of appetite.  Weight loss.  Low-grade fever.  Dry eyes and dry mouth.  Firm lumps (rheumatoid nodules) that grow beneath your skin in areas such as your forearm bones near your elbows and on your hands.  Changes in the appearance of joints (deformity) and loss of joint function. Symptoms of this condition vary from person to person.  Symptoms of RA often come and go.  Sometimes, symptoms get worse for a period of time. These are called flares. How is this diagnosed? This  condition is diagnosed based on your symptoms, medical history, and physical exam.  You may have X-rays or an MRI to check for the type of joint changes that are caused by RA. You may also have blood tests to look for:  Proteins (antibodies) that your immune system may make if you have RA. These include rheumatoid factor (RF) and anti-CCP. ? When blood tests show these proteins, you are said to have "seropositive RA." ? When blood tests do not show these proteins, you may have "seronegative RA."  Inflammation in your blood.  A low number of red blood cells (anemia). How is this treated? The goals of treatment are to relieve pain, reduce inflammation, and slow down or stop joint damage and disability. Treatment may include:  Lifestyle changes. It is important to rest as needed, eat a healthy diet, and exercise.  Medicines. Your health care provider may adjust your medicines every 3 months until treatment goals are reached. Common medicines include: ? Pain relievers (analgesics). ? Corticosteroids and NSAIDs to reduce inflammation. ? Disease-modifying antirheumatic drugs (DMARDs) to try to slow the course of the disease. ? Biologic response modifiers to reduce inflammation and damage.  Physical therapy and occupational therapy.  Surgery, if you have severe joint damage. Joint replacement or fusing of joints may be needed. Your health care provider will work with you to identify the best treatment option for you based on assessment of the overall disease activity in your body. Follow these instructions at home: Activity  Return to your normal  activities as told by your health care provider. Ask your health care provider what activities are safe for you.  Rest when you are having a flare.  Start an exercise program as told by your health care provider. General instructions  Keep all follow-up visits as told by your health care provider. This is important.  Take over-the-counter and  prescription medicines only as told by your health care provider. Where to find more information  [employer] of Rheumatology: www.rheumatology.[place]: www.arthritis.org Contact a health care provider if:  You have a flare-up of RA symptoms.  You have a fever.  You have side effects from your medicines. Get help right away if:  You have chest pain.  You have trouble breathing.  You quickly develop a hot, painful joint that is more severe than your usual joint aches. Summary  Rheumatoid arthritis (RA) is a long-term (chronic) disease that causes inflammation in your joints.  RA is an autoimmune disease.  The goals of treatment are to relieve pain, reduce inflammation, and slow down or stop joint damage and disability. This information is not intended to replace advice given to you by your health care provider. Make sure you discuss any questions you have with your health care provider. Document Released: 02/21/2000 Document Revised: 10/26/2017 Document Reviewed: 10/26/2017 Elsevier Patient Education  2020 [employer].

## 2018-09-13 NOTE — Progress Notes (Signed)
Virtual Visit via Video (App used: Doximity) Note  I connected with      Kayla Lindsey on 09/13/18 at 11:15 by a telemedicine application and verified that I am speaking with the correct person using two identifiers.  Patient is at home I am in office    I discussed the limitations of evaluation and management by telemedicine and the availability of in person appointments. The patient expressed understanding and agreed to proceed.  History of Present Illness: Kayla Lindsey is a 53 y.o. female who would like to discuss soreness, fatigue   . Location: hands and finger joints - all, but worse in L wrist and R pinky finger. Ankles are also sore.  . Quality: swelling, stiffness, soreness . Duration: few months  . Timing: stiffness is worse in the morning upon waking. Pretty constant last 6 weeks or so, every morning.  . Context:grandmother also has rheumatoid  . Modifying factors: moving around helps  . Assoc signs/symptoms: fatigue ongoing for a few months      Observations/Objective: BP 112/82 (Patient Position: Sitting, Cuff Size: Normal)   Pulse 100   Temp 97.6 F (36.4 C) (Oral)   Wt 167 lb (75.8 kg)   BMI 26.16 kg/m  BP Readings from Last 3 Encounters:  09/13/18 112/82  09/12/18 (!) 132/97  04/07/18 (!) 130/91   Exam: Normal Speech.  NAD  Lab and Radiology Results Results for orders placed or performed during the hospital encounter of 09/12/18 (from the past 72 hour(s))  CBC     Status: None   Collection Time: 09/12/18  8:49 AM  Result Value Ref Range   WBC 8.0 4.0 - 10.5 K/uL   RBC 4.61 3.87 - 5.11 MIL/uL   Hemoglobin 13.4 12.0 - 15.0 g/dL   HCT 42.2 36.0 - 46.0 %   MCV 91.5 80.0 - 100.0 fL   MCH 29.1 26.0 - 34.0 pg   MCHC 31.8 30.0 - 36.0 g/dL   RDW 12.5 11.5 - 15.5 %   Platelets 339 150 - 400 K/uL   nRBC 0.0 0.0 - 0.2 %    Comment: Performed at [facility], [place] [address] [place], [place] [address]  Surgical pcr screen      Status: None   Collection Time: 09/12/18  8:49 AM   Specimen: Nasal Mucosa; Nasal Swab  Result Value Ref Range   MRSA, PCR NEGATIVE NEGATIVE   Staphylococcus aureus NEGATIVE NEGATIVE    Comment: (NOTE) The Xpert SA Assay (FDA approved for NASAL specimens in patients 13 years of age and older), is one component of a comprehensive surveillance program. It is not intended to diagnose infection nor to guide or monitor treatment. Performed at [facility], [place] [address], [place], [place] [address]    No results found.     Assessment and Plan: 53 y.o. female with The primary encounter diagnosis was Joint stiffness. Diagnoses of Chronic fatigue, Myalgia, Hypothyroidism, unspecified type, and Vitiligo were also pertinent to this visit.   PDMP not reviewed this encounter. Orders Placed This Encounter  Procedures  . DG Hand Complete Left    Order Specific Question:   Reason for Exam (SYMPTOM  OR DIAGNOSIS REQUIRED)    Answer:   pain    Order Specific Question:   Is patient pregnant?    Answer:   No    Order Specific Question:   Preferred imaging location?    Answer:   [facility]  Order Specific Question:   Radiology Contrast Protocol - do NOT remove file path    Answer:   \\[email or web address]\Radiant\DXFluoroContrastProtocols.pdf  . DG Hand Complete Right    Order Specific Question:   Reason for Exam (SYMPTOM  OR DIAGNOSIS REQUIRED)    Answer:   pain    Order Specific Question:   Is patient pregnant?    Answer:   No    Order Specific Question:   Preferred imaging location?    Answer:   [facility]    Order Specific Question:   Radiology Contrast Protocol - do NOT remove file path    Answer:   \\[email or web address]\[email or web address]s.pdf  . Rheumatoid factor  . Cyclic citrul peptide antibody, IgG  . High sensitivity CRP  . Sedimentation rate  . ANA  . TSH  . COMPLETE METABOLIC PANEL WITH GFR  . CBC with  Differential/Platelet   No orders of the defined types were placed in this encounter.  Patient Instructions  Will get Xrays and labs Let's schedule a visit next week to go over everything in detail  I suspect possible Rheumatoid Arthritis   Rheumatoid Arthritis Rheumatoid arthritis (RA) is a long-term (chronic) disease that causes inflammation in your joints. RA may start slowly. It most often affects the small joints of the hands and feet. Usually, the same joints are affected on both sides of your body. Inflammation from RA can also affect other parts of your body, including your heart, eyes, or lungs. There is no cure for RA, but medicines can help your symptoms and halt or slow down the progression of the disease. What are the causes? RA is an autoimmune disease. When you have an autoimmune disease, your body's defense system (immune system) mistakenly attacks healthy body tissues. The exact cause of RA is not known. What increases the risk? You are more likely to develop this condition if you:  Are a woman.  Have a family history of of RA or other autoimmune diseases.  Have a history of smoking.  Are obese.  Have been exposed to pollutants or chemicals. What are the signs or symptoms? The first symptom of this condition may be morning stiffness that lasts longer than 30 minutes.  Symptoms usually start gradually. They are often worse in the morning. As RA progresses, symptoms may include:  Pain, stiffness, swelling, warmth, and tenderness in joints on both sides of your body.  Loss of energy.  Loss of appetite.  Weight loss.  Low-grade fever.  Dry eyes and dry mouth.  Firm lumps (rheumatoid nodules) that grow beneath your skin in areas such as your forearm bones near your elbows and on your hands.  Changes in the appearance of joints (deformity) and loss of joint function. Symptoms of this condition vary from person to person.  Symptoms of RA often come and go.   Sometimes, symptoms get worse for a period of time. These are called flares. How is this diagnosed? This condition is diagnosed based on your symptoms, medical history, and physical exam.  You may have X-rays or an MRI to check for the type of joint changes that are caused by RA. You may also have blood tests to look for:  Proteins (antibodies) that your immune system may make if you have RA. These include rheumatoid factor (RF) and anti-CCP. ? When blood tests show these proteins, you are said to have "seropositive RA." ? When blood tests do not show these proteins, you may have "seronegative RA."  Inflammation in your blood.  A low number of red blood cells (anemia). How is this treated? The goals of treatment are to relieve pain, reduce inflammation, and slow down or stop joint damage and disability. Treatment may include:  Lifestyle changes. It is important to rest as needed, eat a healthy diet, and exercise.  Medicines. Your health care provider may adjust your medicines every 3 months until treatment goals are reached. Common medicines include: ? Pain relievers (analgesics). ? Corticosteroids and NSAIDs to reduce inflammation. ? Disease-modifying antirheumatic drugs (DMARDs) to try to slow the course of the disease. ? Biologic response modifiers to reduce inflammation and damage.  Physical therapy and occupational therapy.  Surgery, if you have severe joint damage. Joint replacement or fusing of joints may be needed. Your health care provider will work with you to identify the best treatment option for you based on assessment of the overall disease activity in your body. Follow these instructions at home: Activity  Return to your normal activities as told by your health care provider. Ask your health care provider what activities are safe for you.  Rest when you are having a flare.  Start an exercise program as told by your health care provider. General instructions   Keep all follow-up visits as told by your health care provider. This is important.  Take over-the-counter and prescription medicines only as told by your health care provider. Where to find more information  [employer] Rheumatology: www.rheumatology.[place]: www.arthritis.org Contact a health care provider if:  You have a flare-up of RA symptoms.  You have a fever.  You have side effects from your medicines. Get help right away if:  You have chest pain.  You have trouble breathing.  You quickly develop a hot, painful joint that is more severe than your usual joint aches. Summary  Rheumatoid arthritis (RA) is a long-term (chronic) disease that causes inflammation in your joints.  RA is an autoimmune disease.  The goals of treatment are to relieve pain, reduce inflammation, and slow down or stop joint damage and disability. This information is not intended to replace advice given to you by your health care provider. Make sure you discuss any questions you have with your health care provider. Document Released: 02/21/2000 Document Revised: 10/26/2017 Document Reviewed: 10/26/2017 Elsevier Patient Education  2020 [employer].    Instructions sent via [place]. If MyChart not available, pt was given option for info via personal e-mail w/ no guarantee of protected health info over unsecured e-mail communication, and MyChart sign-up instructions were included.   Follow Up Instructions: Return for virtual visit Monday 09/19/18 to review results in detail, call/message me sooner if needed! .    I discussed the assessment and treatment plan with the patient. The patient was provided an opportunity to ask questions and all were answered. The patient agreed with the plan and demonstrated an understanding of the instructions.   The patient was advised to call back or seek an in-person evaluation if any new concerns, if symptoms worsen or if the condition fails  to improve as anticipated.  25 minutes of non-face-to-face time was provided during this encounter.                      Historical information moved to improve visibility of documentation.  Past Medical History:  Diagnosis Date  . Anxiety   . Depression   . Hashimoto's thyroiditis   . Hypothyroidism   . Mastocytosis   . Osteoarthritis  right shoulder   Past Surgical History:  Procedure Laterality Date  . APPENDECTOMY    . BREAST BIOPSY Bilateral   . BREAST LUMPECTOMY Right   . carpel tunnel    . CHOLECYSTECTOMY    . HIP SURGERY    . SHOULDER ACROMIOPLASTY     Social History   Tobacco Use  . Smoking status: Never Smoker  . Smokeless tobacco: Never Used  Substance Use Topics  . Alcohol use: Yes    Frequency: Never    Comment: social   family history includes COPD in her father and mother; Cancer in her mother; Esophageal cancer in her mother; Heart disease in her mother; Thyroid disease in her mother.  Medications: Current Outpatient Medications  Medication Sig Dispense Refill  . Azelaic Acid 15 % cream Apply 1 application topically 2 (two) times daily. After skin is thoroughly washed and patted dry 50 g 11  . fexofenadine (ALLEGRA) 180 MG tablet Take 180 mg by mouth daily.    Marland Kitchen levothyroxine (SYNTHROID, LEVOTHROID) 125 MCG tablet Take 1 tablet (125 mcg total) by mouth daily before breakfast. 90 tablet 3  . venlafaxine XR (EFFEXOR-XR) 150 MG 24 hr capsule Take 1 capsule (150 mg total) by mouth daily with breakfast. 90 capsule 1   No current facility-administered medications for this visit.    No Known Allergies  PDMP not reviewed this encounter. Orders Placed This Encounter  Procedures  . DG Hand Complete Left    Order Specific Question:   Reason for Exam (SYMPTOM  OR DIAGNOSIS REQUIRED)    Answer:   pain    Order Specific Question:   Is patient pregnant?    Answer:   No    Order Specific Question:   Preferred imaging location?    Answer[facility]    Order Specific Question:   Radiology Contrast Protocol - do NOT remove file path    Answer:   \\[email or web address]\epicdata\Radiant\DXFluoroContrastProtocols.pdf  . DG Hand Complete Right    Order Specific Question:   Reason for Exam (SYMPTOM  OR DIAGNOSIS REQUIRED)    Answer:   pain    Order Specific Question:   Is patient pregnant?    Answer:   No    Order Specific Question:   Preferred imaging location?    Answer[facility]    Order Specific Question:   Radiology Contrast Protocol - do NOT remove file path    Answer:   \\charchive\epicdata\Radiant\DXFluoroContrastProtocols.pdf  . Rheumatoid factor  . Cyclic citrul peptide antibody, IgG  . High sensitivity CRP  . Sedimentation rate  . ANA  . TSH  . COMPLETE METABOLIC PANEL WITH GFR  . CBC with Differential/Platelet   No orders of the defined types were placed in this encounter.

## 2018-09-14 MED FILL — LEVOTHYROXINE 125 MCG TAB: 125 | 90 days supply | Qty: 90 | Fill #1

## 2018-09-15 ENCOUNTER — Encounter: Payer: Self-pay | Admitting: Osteopathic Medicine

## 2018-09-15 ENCOUNTER — Ambulatory Visit (INDEPENDENT_AMBULATORY_CARE_PROVIDER_SITE_OTHER): Payer: 59 | Admitting: Osteopathic Medicine

## 2018-09-15 VITALS — Wt 167.0 lb

## 2018-09-15 DIAGNOSIS — R768 Other specified abnormal immunological findings in serum: Secondary | ICD-10-CM

## 2018-09-15 DIAGNOSIS — L80 Vitiligo: Secondary | ICD-10-CM

## 2018-09-15 DIAGNOSIS — E039 Hypothyroidism, unspecified: Secondary | ICD-10-CM

## 2018-09-15 DIAGNOSIS — R7689 Other specified abnormal immunological findings in serum: Secondary | ICD-10-CM

## 2018-09-15 LAB — ANTI-NUCLEAR AB-TITER (ANA TITER)
ANA TITER: 1:80 {titer} — ABNORMAL HIGH
ANA Titer 1: 1:80 {titer} — ABNORMAL HIGH

## 2018-09-15 LAB — COMPLETE METABOLIC PANEL WITH GFR
AG Ratio: 1.5 (calc) (ref 1.0–2.5)
ALT: 24 U/L (ref 6–29)
AST: 22 U/L (ref 10–35)
Albumin: 4.1 g/dL (ref 3.6–5.1)
Alkaline phosphatase (APISO): 79 U/L (ref 37–153)
BUN: 16 mg/dL (ref 7–25)
CO2: 28 mmol/L (ref 20–32)
Calcium: 9.2 mg/dL (ref 8.6–10.4)
Chloride: 105 mmol/L (ref 98–110)
Creat: 0.88 mg/dL (ref 0.50–1.05)
GFR, Est African American: 88 mL/min/{1.73_m2} (ref >=60)
GFR, Est Non African American: 76 mL/min/{1.73_m2} (ref >=60)
Globulin: 2.8 g/dL (calc) (ref 1.9–3.7)
Glucose, Bld: 90 mg/dL (ref 65–139)
Potassium: 4 mmol/L (ref 3.5–5.3)
Sodium: 141 mmol/L (ref 135–146)
Total Bilirubin: 0.2 mg/dL (ref 0.2–1.2)
Total Protein: 6.9 g/dL (ref 6.1–8.1)

## 2018-09-15 LAB — CBC WITH DIFFERENTIAL/PLATELET
Absolute Monocytes: 563 cells/uL (ref 200–950)
Basophils Absolute: 62 cells/uL (ref 0–200)
Basophils Relative: 0.7 %
Eosinophils Absolute: 141 cells/uL (ref 15–500)
Eosinophils Relative: 1.6 %
HCT: 39.9 % (ref 35.0–45.0)
Hemoglobin: 13.8 g/dL (ref 11.7–15.5)
Lymphs Abs: 2957 cells/uL (ref 850–3900)
MCH: 30.3 pg (ref 27.0–33.0)
MCHC: 34.6 g/dL (ref 32.0–36.0)
MCV: 87.5 fL (ref 80.0–100.0)
MPV: 10 fL (ref 7.5–12.5)
Monocytes Relative: 6.4 %
Neutro Abs: 5078 cells/uL (ref 1500–7800)
Neutrophils Relative %: 57.7 %
Platelets: 355 10*3/uL (ref 140–400)
RBC: 4.56 10*6/uL (ref 3.80–5.10)
RDW: 12.4 % (ref 11.0–15.0)
Total Lymphocyte: 33.6 %
WBC: 8.8 10*3/uL (ref 3.8–10.8)

## 2018-09-15 LAB — TSH: TSH: 0.3 mIU/L — ABNORMAL LOW

## 2018-09-15 LAB — ANA: Anti Nuclear Antibody (ANA): POSITIVE — AB

## 2018-09-15 LAB — HIGH SENSITIVITY CRP: hs-CRP: 2 mg/L

## 2018-09-15 LAB — CYCLIC CITRUL PEPTIDE ANTIBODY, IGG: Cyclic Citrullin Peptide Ab: <16 UNITS

## 2018-09-15 LAB — SEDIMENTATION RATE: Sed Rate: 6 mm/h (ref 0–30)

## 2018-09-15 LAB — RHEUMATOID FACTOR: Rheumatoid fact SerPl-aCnc: <14 IU/mL (ref <14)

## 2018-09-15 MED ORDER — LEVOTHYROXINE SODIUM 112 MCG PO TABS: 112.0000 ug | ORAL_TABLET | Freq: Every day | ORAL | 0 refills | Status: DC

## 2018-09-15 NOTE — Progress Notes (Signed)
Virtual Visit via Phone   I connected with      [name] on 09/15/18 at 1:00 PM by a telemedicine application and verified that I am speaking with the correct person using two identifiers.  Patient is at home I am in office    I discussed the limitations of evaluation and management by telemedicine and the availability of in person appointments. The patient expressed understanding and agreed to proceed.  History of Present Illness: Kayla Lindsey is a 53 y.o. female who would like to discuss lab results    Results are back! ANA(+) but other tests negative for RA (neg RF, CCP, CRP, ESR). Given symptoms, though, I'm suspisous of posisble seronegative RA.       Observations/Objective: Wt 167 lb (75.8 kg)   BMI 26.16 kg/m  BP Readings from Last 3 Encounters:  09/13/18 112/82  09/12/18 (!) 132/97  04/07/18 (!) 130/91   Exam: Normal Speech.   Lab and Radiology Results Results for orders placed or performed in visit on 09/13/18 (from the past 72 hour(s))  Rheumatoid factor     Status: None   Collection Time: 09/13/18  1:48 PM  Result Value Ref Range   Rhuematoid fact SerPl-aCnc <20 <94 IU/mL  Cyclic citrul peptide antibody, IgG     Status: None   Collection Time: 09/13/18  1:48 PM  Result Value Ref Range   Cyclic Citrullin Peptide Ab <16 UNITS    Comment: Reference Range Negative:            <20 Weak Positive:       20-39 Moderate Positive:   40-59 Strong Positive:     >59 .   High sensitivity CRP     Status: None   Collection Time: 09/13/18  1:48 PM  Result Value Ref Range   hs-CRP 2.0 mg/L    Comment: Reference Range Optimal <1.0 Jellinger PS et al. [facility] [place] Pract.2017;23(Suppl 2):1-87. . For ages >78 Years: hs-CRP mg/L  Risk According to AHA/CDC Guidelines <1.0         Lower relative cardiovascular risk. 1.0-3.0      Average relative cardiovascular risk. 3.1-10.0     Higher relative cardiovascular risk.              Consider retesting in 1  to 2 weeks to              exclude a benign transient elevation              in the baseline CRP value secondary              to infection or inflammation. >10.0        Persistent elevation, upon retesting,              may be associated with infection and              inflammation. .   Sedimentation rate     Status: None   Collection Time: 09/13/18  1:48 PM  Result Value Ref Range   Sed Rate 6 0 - 30 mm/h  ANA     Status: Abnormal   Collection Time: 09/13/18  1:48 PM  Result Value Ref Range   Anti Nuclear Antibody (ANA) POSITIVE (A) NEGATIVE    Comment: ANA IFA is a first line screen for detecting the presence of up to approximately 150 autoantibodies in various autoimmune diseases. A positive ANA IFA result is suggestive of autoimmune disease and reflexes  to titer and pattern. Further laboratory testing may be considered if clinically indicated. . For additional information, please refer to http://education.[facility].com/faq/FAQ177 (This link is being provided for informational/ educational purposes only.) .   TSH     Status: Abnormal   Collection Time: 09/13/18  1:48 PM  Result Value Ref Range   TSH 0.30 (L) mIU/L    Comment:           Reference Range .           > or = 20 Years  0.40-4.50 .                Pregnancy Ranges           First trimester    0.26-2.66           Second trimester   0.55-2.73           Third trimester    0.43-2.91   COMPLETE METABOLIC PANEL WITH GFR     Status: None   Collection Time: 09/13/18  1:48 PM  Result Value Ref Range   Glucose, Bld 90 65 - 139 mg/dL    Comment: .        Non-fasting reference interval .    BUN 16 7 - 25 mg/dL   Creat 0.88 0.50 - 1.05 mg/dL    Comment: For patients >68 years of age, the reference limit for Creatinine is approximately 13% higher for people identified as African-American. .    GFR, Est Non African American 76 > OR = 60 mL/min/1.77m   GFR, Est African American 88 > OR = 60 mL/min/1.76m   BUN/Creatinine Ratio NOT APPLICABLE 6 - 22 (calc)   Sodium 141 135 - 146 mmol/L   Potassium 4.0 3.5 - 5.3 mmol/L   Chloride 105 98 - 110 mmol/L   CO2 28 20 - 32 mmol/L   Calcium 9.2 8.6 - 10.4 mg/dL   Total Protein 6.9 6.1 - 8.1 g/dL   Albumin 4.1 3.6 - 5.1 g/dL   Globulin 2.8 1.9 - 3.7 g/dL (calc)   AG Ratio 1.5 1.0 - 2.5 (calc)   Total Bilirubin 0.2 0.2 - 1.2 mg/dL   Alkaline phosphatase (APISO) 79 37 - 153 U/L   AST 22 10 - 35 U/L   ALT 24 6 - 29 U/L  CBC with Differential/Platelet     Status: None   Collection Time: 09/13/18  1:48 PM  Result Value Ref Range   WBC 8.8 3.8 - 10.8 Thousand/uL   RBC 4.56 3.80 - 5.10 Million/uL   Hemoglobin 13.8 11.7 - 15.5 g/dL   HCT 39.9 35.0 - 45.0 %   MCV 87.5 80.0 - 100.0 fL   MCH 30.3 27.0 - 33.0 pg   MCHC 34.6 32.0 - 36.0 g/dL   RDW 12.4 11.0 - 15.0 %   Platelets 355 140 - 400 Thousand/uL   MPV 10.0 7.5 - 12.5 fL   Neutro Abs 5,078 1,500 - 7,800 cells/uL   Lymphs Abs 2,957 850 - 3,900 cells/uL   Absolute Monocytes 563 200 - 950 cells/uL   Eosinophils Absolute 141 15 - 500 cells/uL   Basophils Absolute 62 0 - 200 cells/uL   Neutrophils Relative % 57.7 %   Total Lymphocyte 33.6 %   Monocytes Relative 6.4 %   Eosinophils Relative 1.6 %   Basophils Relative 0.7 %  Anti-nuclear ab-titer (ANA titer)     Status: Abnormal   Collection Time: 09/13/18  1:48 PM  Result Value  Ref Range   ANA Titer 1 1:80 (H) titer    Comment: A low level ANA titer may be present in pre-clinical autoimmune diseases and normal individuals.                 Reference Range                 <1:40        Negative                 1:40-1:80    Low Antibody Level                 >1:80        Elevated Antibody Level .    ANA Pattern 1 Nuclear, Homogeneous (A)     Comment: Homogeneous pattern is associated with systemic lupus erythematosus (SLE), drug-induced lupus and juvenile idiopathic arthritis. . AC-1: Homogeneous . International Consensus on ANA  Patterns (https://www.hernandez-brewer.com/)    ANA TITER 1:80 (H) titer    Comment: A low level ANA titer may be present in pre-clinical autoimmune diseases and normal individuals.                 Reference Range                 <1:40        Negative                 1:40-1:80    Low Antibody Level                 >1:80        Elevated Antibody Level .    ANA PATTERN Nuclear, Speckled (A)     Comment: Speckled pattern is associated with mixed connective tissue disease (MCTD), systemic lupus erythematosus (SLE), Sjogren's syndrome, dermatomyositis, and  systemic sclerosis/polymyositis overlap. . AC-2,4,5,29: Speckled . International Consensus on ANA Patterns (https://www.hernandez-brewer.com/)    Dg Hand Complete Left  Result Date: 09/13/2018 CLINICAL DATA:  Multifocal joint pain, initial encounter EXAM: LEFT HAND - COMPLETE 3+ VIEW COMPARISON:  None. FINDINGS: There is no evidence of fracture or dislocation. There is no evidence of arthropathy or other focal bone abnormality. Soft tissues are unremarkable. IMPRESSION: No acute abnormality noted. Electronically Signed   By: Inez Catalina M.D.   On: 09/13/2018 16:30   Dg Hand Complete Right  Result Date: 09/13/2018 CLINICAL DATA:  Multifocal joint pain, initial encounter EXAM: RIGHT HAND - COMPLETE 3+ VIEW COMPARISON:  None. FINDINGS: There is no evidence of fracture or dislocation. There is no evidence of arthropathy or other focal bone abnormality. Soft tissues are unremarkable. IMPRESSION: No acute abnormality noted. Electronically Signed   By: Inez Catalina M.D.   On: 09/13/2018 16:31       Assessment and Plan: 53 y.o. female with The primary encounter diagnosis was ANA positive. Diagnoses of Hypothyroidism, unspecified type and Vitiligo were also pertinent to this visit.  Offered to try MTX vs refer for second opinion. Pt would like to consult with a specialist and I'd tend to agree. She has history of Hashimoto's as  well as vitiligo.   PDMP not reviewed this encounter. Orders Placed This Encounter  Procedures  . TSH  . Ambulatory referral to Rheumatology    Referral Priority:   Routine    Referral Type:   Consultation    Referral Reason:   Specialty Services Required    Requested Specialty:   Rheumatology    Number of Visits  Requested:   1   Meds ordered this encounter  Medications  . levothyroxine (SYNTHROID) 112 MCG tablet    Sig: Take 1 tablet (112 mcg total) by mouth daily before breakfast.    Dispense:  90 tablet    Refill:  0    Mail to patient   There are no Patient Instructions on file for this visit.  Instructions sent via MyChart. If MyChart not available, pt was given option for info via personal e-mail w/ no guarantee of protected health info over unsecured e-mail communication, and MyChart sign-up instructions were included.   Follow Up Instructions: Return if symptoms worsen or fail to improve.    I discussed the assessment and treatment plan with the patient. The patient was provided an opportunity to ask questions and all were answered. The patient agreed with the plan and demonstrated an understanding of the instructions.   The patient was advised to call back or seek an in-person evaluation if any new concerns, if symptoms worsen or if the condition fails to improve as anticipated.  15 minutes of non-face-to-face time was provided during this encounter.                      Historical information moved to improve visibility of documentation.  Past Medical History:  Diagnosis Date  . Anxiety   . Depression   . Hashimoto's thyroiditis   . Hypothyroidism   . Mastocytosis   . Osteoarthritis    right shoulder   Past Surgical History:  Procedure Laterality Date  . APPENDECTOMY    . BREAST BIOPSY Bilateral   . BREAST LUMPECTOMY Right   . carpel tunnel    . CHOLECYSTECTOMY    . HIP SURGERY    . SHOULDER ACROMIOPLASTY     Social History    Tobacco Use  . Smoking status: Never Smoker  . Smokeless tobacco: Never Used  Substance Use Topics  . Alcohol use: Yes    Frequency: Never    Comment: social   family history includes COPD in her father and mother; Cancer in her mother; Esophageal cancer in her mother; Heart disease in her mother; Thyroid disease in her mother.  Medications: Current Outpatient Medications  Medication Sig Dispense Refill  . Azelaic Acid 15 % cream Apply 1 application topically 2 (two) times daily. After skin is thoroughly washed and patted dry 50 g 11  . fexofenadine (ALLEGRA) 180 MG tablet Take 180 mg by mouth daily.    Marland Kitchen levothyroxine (SYNTHROID) 112 MCG tablet Take 1 tablet (112 mcg total) by mouth daily before breakfast. 90 tablet 0  . venlafaxine XR (EFFEXOR-XR) 150 MG 24 hr capsule Take 1 capsule (150 mg total) by mouth daily with breakfast. 90 capsule 1   No current facility-administered medications for this visit.    No Known Allergies  PDMP not reviewed this encounter. Orders Placed This Encounter  Procedures  . TSH  . Ambulatory referral to Rheumatology    Referral Priority:   Routine    Referral Type:   Consultation    Referral Reason:   Specialty Services Required    Requested Specialty:   Rheumatology    Number of Visits Requested:   1   Meds ordered this encounter  Medications  . levothyroxine (SYNTHROID) 112 MCG tablet    Sig: Take 1 tablet (112 mcg total) by mouth daily before breakfast.    Dispense:  90 tablet    Refill:  0    Mail  to patient

## 2018-09-15 NOTE — Progress Notes (Signed)
Called pt at 1250 pm, no answer. Left a detailed vm msg.

## 2018-09-16 ENCOUNTER — Telehealth (INDEPENDENT_AMBULATORY_CARE_PROVIDER_SITE_OTHER): Payer: 59 | Admitting: Osteopathic Medicine

## 2018-09-16 ENCOUNTER — Encounter: Payer: Self-pay | Admitting: Osteopathic Medicine

## 2018-09-16 DIAGNOSIS — L237 Allergic contact dermatitis due to plants, except food: Secondary | ICD-10-CM | POA: Diagnosis not present

## 2018-09-16 MED ORDER — BETAMETHASONE DIPROPIONATE 0.05 % EX CREA: TOPICAL_CREAM | Freq: Two times a day (BID) | CUTANEOUS | 1 refills | Status: DC

## 2018-09-16 MED ORDER — PREDNISONE 10 MG (48) PO TBPK: ORAL_TABLET | Freq: Every day | ORAL | 0 refills | Status: DC

## 2018-09-16 MED FILL — LEVOTHYROXINE 112 MCG TAB: 112 | 90 days supply | Qty: 90 | Fill #0

## 2018-09-16 NOTE — Progress Notes (Signed)
Virtual Visit via Phone   I connected with      Kayla Lindsey on 09/16/18 at 11:50 AM by a telemedicine application and verified that I am speaking with the correct person using two identifiers.  Patient is at [facility] I am in office   I discussed the limitations of evaluation and management by telemedicine and the availability of in person appointments. The patient expressed understanding and agreed to proceed.  History of Present Illness: Kayla Lindsey is a 53 y.o. female who would like to discuss poison ivy   Rash on arms and legs, itching, c/w poison ivy / oak. She was exposed during yard work.        Observations/Objective: There were no vitals taken for this visit. BP Readings from Last 3 Encounters:  09/13/18 112/82  09/12/18 (!) 132/97  04/07/18 (!) 130/91   Exam: Normal Speech.  NAD  Lab and Radiology Results      Assessment and Plan: 53 y.o. female with The encounter diagnosis was Poison ivy dermatitis.   PDMP not reviewed this encounter. No orders of the defined types were placed in this encounter.  Meds ordered this encounter  Medications  . predniSONE (STERAPRED UNI-PAK 48 TAB) 10 MG (48) TBPK tablet    Sig: Take by mouth daily. 12-Day taper, po    Dispense:  48 tablet    Refill:  0  . betamethasone dipropionate (DIPROLENE) 0.05 % cream    Sig: Apply topically 2 (two) times daily. To affected area(s) as needed    Dispense:  45 g    Refill:  1    Follow Up Instructions: Return if symptoms worsen or fail to improve.    I discussed the assessment and treatment plan with the patient. The patient was provided an opportunity to ask questions and all were answered. The patient agreed with the plan and demonstrated an understanding of the instructions.   The patient was advised to call back or seek an in-person evaluation if any new concerns, if symptoms worsen or if the condition fails to improve as anticipated.  5 minutes of  non-face-to-face time was provided during this encounter.                      Historical information moved to improve visibility of documentation.  Past Medical History:  Diagnosis Date  . Anxiety   . Depression   . Hashimoto's thyroiditis   . Hypothyroidism   . Mastocytosis   . Osteoarthritis    right shoulder   Past Surgical History:  Procedure Laterality Date  . APPENDECTOMY    . BREAST BIOPSY Bilateral   . BREAST LUMPECTOMY Right   . carpel tunnel    . CHOLECYSTECTOMY    . HIP SURGERY    . SHOULDER ACROMIOPLASTY     Social History   Tobacco Use  . Smoking status: Never Smoker  . Smokeless tobacco: Never Used  Substance Use Topics  . Alcohol use: Yes    Frequency: Never    Comment: social   family history includes COPD in her father and mother; Cancer in her mother; Esophageal cancer in her mother; Heart disease in her mother; Thyroid disease in her mother.  Medications: Current Outpatient Medications  Medication Sig Dispense Refill  . Azelaic Acid 15 % cream Apply 1 application topically 2 (two) times daily. After skin is thoroughly washed and patted dry 50 g 11  . fexofenadine (ALLEGRA) 180 MG tablet Take 180  mg by mouth daily.    Marland Kitchen levothyroxine (SYNTHROID) 112 MCG tablet Take 1 tablet (112 mcg total) by mouth daily before breakfast. 90 tablet 0  . betamethasone dipropionate (DIPROLENE) 0.05 % cream Apply topically 2 (two) times daily. To affected area(s) as needed 45 g 1  . predniSONE (STERAPRED UNI-PAK 48 TAB) 10 MG (48) TBPK tablet Take by mouth daily. 12-Day taper, po 48 tablet 0  . venlafaxine XR (EFFEXOR-XR) 150 MG 24 hr capsule Take 1 capsule (150 mg total) by mouth daily with breakfast. 90 capsule 1   No current facility-administered medications for this visit.    No Known Allergies  PDMP not reviewed this encounter. No orders of the defined types were placed in this encounter.  Meds ordered this encounter  Medications  .  predniSONE (STERAPRED UNI-PAK 48 TAB) 10 MG (48) TBPK tablet    Sig: Take by mouth daily. 12-Day taper, po    Dispense:  48 tablet    Refill:  0  . betamethasone dipropionate (DIPROLENE) 0.05 % cream    Sig: Apply topically 2 (two) times daily. To affected area(s) as needed    Dispense:  45 g    Refill:  1

## 2018-09-17 ENCOUNTER — Other Ambulatory Visit (HOSPITAL_COMMUNITY)
Admission: RE | Admit: 2018-09-17 | Discharge: 2018-09-17 | Disposition: A | Discharge status: Home / Self Care | Payer: 59 | Source: Ambulatory Visit | Attending: Orthopaedic Surgery | Admitting: Orthopaedic Surgery

## 2018-09-17 DIAGNOSIS — Z01812 Encounter for preprocedural laboratory examination: Secondary | ICD-10-CM | POA: Diagnosis not present

## 2018-09-17 DIAGNOSIS — Z1159 Encounter for screening for other viral diseases: Secondary | ICD-10-CM | POA: Insufficient documentation

## 2018-09-17 LAB — SARS CORONAVIRUS 2 (TAT 6-24 HRS): SARS Coronavirus 2: NEGATIVE

## 2018-09-19 ENCOUNTER — Ambulatory Visit: Payer: 59 | Admitting: Osteopathic Medicine

## 2018-09-20 DIAGNOSIS — M19011 Primary osteoarthritis, right shoulder: Secondary | ICD-10-CM | POA: Diagnosis not present

## 2018-09-30 NOTE — Patient Outreach (Signed)
This case will be closed due to this patient's procedure has been postponed to a later date.

## 2018-10-03 ENCOUNTER — Other Ambulatory Visit: Payer: Self-pay

## 2018-10-03 ENCOUNTER — Encounter: Payer: Self-pay | Admitting: Osteopathic Medicine

## 2018-10-03 ENCOUNTER — Encounter: Payer: Self-pay | Admitting: Family Medicine

## 2018-10-03 ENCOUNTER — Ambulatory Visit (INDEPENDENT_AMBULATORY_CARE_PROVIDER_SITE_OTHER): Payer: 59 | Admitting: Family Medicine

## 2018-10-03 VITALS — BP 119/79 | HR 86 | Ht 67.0 in | Wt 173.0 lb

## 2018-10-03 DIAGNOSIS — L719 Rosacea, unspecified: Secondary | ICD-10-CM

## 2018-10-03 DIAGNOSIS — L80 Vitiligo: Secondary | ICD-10-CM | POA: Diagnosis not present

## 2018-10-03 DIAGNOSIS — L237 Allergic contact dermatitis due to plants, except food: Secondary | ICD-10-CM

## 2018-10-03 MED ORDER — DOXYCYCLINE HYCLATE 100 MG PO TABS: 100.0000 mg | ORAL_TABLET | Freq: Two times a day (BID) | ORAL | 0 refills | Status: DC

## 2018-10-03 MED ORDER — AZELAIC ACID 15 % EX GEL: 1.0000 "application " | Freq: Two times a day (BID) | CUTANEOUS | 11 refills | Status: AC

## 2018-10-03 MED ORDER — PREDNISONE 20 MG PO TABS: ORAL_TABLET | ORAL | 0 refills | Status: AC

## 2018-10-03 MED ORDER — SODIUM SULFACETAMIDE WASH 10 % EX LIQD: 1.0000 "application " | Freq: Two times a day (BID) | CUTANEOUS | 5 refills | Status: DC | PRN

## 2018-10-03 NOTE — Progress Notes (Signed)
Acute Office Visit  Subjective:    Patient ID: Kayla Lindsey, female    DOB: 05-04-1965, 53 y.o.   MRN: [id number]  Chief Complaint  Patient presents with  . Rash    pt reports that she has rosecea and while out of town she didn't pack her skin care regimen and the a    HPI Patient is in today for rash mostly on her face that she is concerned about.  She has a history of rosacea and says sometimes it just flares.  Normally she uses a sulfacetamide wash as well as a salicylic acid and that seems to help keep it under control but more recently she left town to visit family member and forgot to take her things with her and then it really flared on her face.  Plus she has been working in the yard and has some poison ivy on her chin and neck.  She says it is been quite itchy.  She says when she is had flares in the past they have used doxycycline.  Past Medical History:  Diagnosis Date  . Anxiety   . Depression   . Hashimoto's thyroiditis   . Hypothyroidism   . Mastocytosis   . Osteoarthritis    right shoulder    Past Surgical History:  Procedure Laterality Date  . APPENDECTOMY    . BREAST BIOPSY Bilateral   . BREAST LUMPECTOMY Right   . carpel tunnel    . CHOLECYSTECTOMY    . HIP SURGERY    . SHOULDER ACROMIOPLASTY      Family History  Problem Relation Age of Onset  . Thyroid disease Mother   . Heart disease Mother   . COPD Mother   . Cancer Mother   . Esophageal cancer Mother   . COPD Father     Social History   Socioeconomic History  . Marital status: Married    Spouse name: Not on file  . Number of children: Not on file  . Years of education: Not on file  . Highest education level: Not on file  Occupational History  . Occupation: Therapist, sports: [employer]  . Financial resource strain: Not on file  . Food insecurity    Worry: Not on file    Inability: Not on file  . Transportation needs    Medical: Not on file     Non-medical: Not on file  Tobacco Use  . Smoking status: Never Smoker  . Smokeless tobacco: Never Used  Substance and Sexual Activity  . Alcohol use: Yes    Frequency: Never    Comment: social  . Drug use: Not Currently  . Sexual activity: Yes    Partners: Male    Birth control/protection: Other-see comments    Comment: Vasectomy  Lifestyle  . Physical activity    Days per week: Not on file    Minutes per session: Not on file  . Stress: Not on file  Relationships  . Social Herbalist on phone: Not on file    Gets together: Not on file    Attends religious service: Not on file    Active member of club or organization: Not on file    Attends meetings of clubs or organizations: Not on file    Relationship status: Not on file  . Intimate partner violence    Fear of current or ex partner: Not on file    Emotionally abused: Not  on file    Physically abused: Not on file    Forced sexual activity: Not on file  Other Topics Concern  . Not on file  Social History Narrative  . Not on file    Outpatient Medications Prior to Visit  Medication Sig Dispense Refill  . fexofenadine (ALLEGRA) 180 MG tablet Take 180 mg by mouth daily.    Marland Kitchen levothyroxine (SYNTHROID) 112 MCG tablet Take 1 tablet (112 mcg total) by mouth daily before breakfast. 90 tablet 0  . Azelaic Acid 15 % cream Apply 1 application topically 2 (two) times daily. After skin is thoroughly washed and patted dry 50 g 11  . venlafaxine XR (EFFEXOR-XR) 150 MG 24 hr capsule Take 1 capsule (150 mg total) by mouth daily with breakfast. 90 capsule 1  . betamethasone dipropionate (DIPROLENE) 0.05 % cream Apply topically 2 (two) times daily. To affected area(s) as needed 45 g 1  . predniSONE (STERAPRED UNI-PAK 48 TAB) 10 MG (48) TBPK tablet Take by mouth daily. 12-Day taper, po 48 tablet 0   No facility-administered medications prior to visit.     No Known Allergies  ROS     Objective:    Physical Exam  BP  119/79   Pulse 86   Ht 5\' 7"  (1.702 m)   Wt 173 lb (78.5 kg)   SpO2 99%   BMI 27.10 kg/m  Wt Readings from Last 3 Encounters:  10/03/18 173 lb (78.5 kg)  09/15/18 167 lb (75.8 kg)  09/13/18 167 lb (75.8 kg)    Health Maintenance Due  Topic Date Due  . HIV Screening  11/26/1981  . PAP SMEAR-Modifier  11/27/1987  . COLONOSCOPY  11/26/2016    There are no preventive care reminders to display for this patient.   Lab Results  Component Value Date   TSH 0.30 (L) 09/13/2018   Lab Results  Component Value Date   WBC 8.8 09/13/2018   HGB 13.8 09/13/2018   HCT 39.9 09/13/2018   MCV 87.5 09/13/2018   PLT 355 09/13/2018   Lab Results  Component Value Date   NA 141 09/13/2018   K 4.0 09/13/2018   CO2 28 09/13/2018   GLUCOSE 90 09/13/2018   BUN 16 09/13/2018   CREATININE 0.88 09/13/2018   BILITOT 0.2 09/13/2018   AST 22 09/13/2018   ALT 24 09/13/2018   PROT 6.9 09/13/2018   CALCIUM 9.2 09/13/2018   No results found for: CHOL No results found for: HDL No results found for: LDLCALC No results found for: TRIG No results found for: CHOLHDL No results found for: HGBA1C     Assessment & Plan:   Problem List Items Addressed This Visit      Musculoskeletal and Integument   Vitiligo   Relevant Orders   Ambulatory referral to Dermatology   Rosacea - Primary   Relevant Medications   Azelaic Acid 15 % cream   Sulfacetamide in Bakuchiol (SODIUM SULFACETAMIDE Advance) 10 % LIQD   Other Relevant Orders   Ambulatory referral to Dermatology    Other Visit Diagnoses    Poison ivy dermatitis         Rosacea -because she is having a flare we will go ahead and treat with oral doxycycline.  And go ahead and restart her wash and cream.  New prescription sent to the pharmacy.  Poison Ivy dermatitis -we will go ahead and treat with prednisone taper over the next 9 days.  If not improving then please let us know.  Vitiligo-she would like to get established again with dermatology.   She also has a history of mastocytosis.  Head and place referral but I did let her know that it would likely be the fall before she would get an appointment.  Meds ordered this encounter  Medications  . predniSONE (DELTASONE) 20 MG tablet    Sig: Take 2 tablets (40 mg total) by mouth daily with breakfast for 3 days, THEN 1 tablet (20 mg total) daily with breakfast for 3 days, THEN 0.5 tablets (10 mg total) daily with breakfast for 3 days.    Dispense:  11 tablet    Refill:  0  . doxycycline (VIBRA-TABS) 100 MG tablet    Sig: Take 1 tablet (100 mg total) by mouth 2 (two) times daily.    Dispense:  20 tablet    Refill:  0  . Azelaic Acid 15 % cream    Sig: Apply 1 application topically 2 (two) times daily. After skin is thoroughly washed and patted dry    Dispense:  50 g    Refill:  11  . Sulfacetamide in Bakuchiol (SODIUM SULFACETAMIDE WASH) 10 % LIQD    Sig: Apply 1 application topically 2 (two) times daily as needed.    Dispense:  480 mL    Refill:  5     Beatrice Lecher, MD

## 2018-10-05 ENCOUNTER — Inpatient Hospital Stay (HOSPITAL_COMMUNITY): Admission: RE | Admit: 2018-10-05 | Payer: 59 | Source: Home / Self Care | Admitting: Orthopaedic Surgery

## 2018-10-05 ENCOUNTER — Encounter (HOSPITAL_COMMUNITY): Admission: RE | Payer: Self-pay | Source: Home / Self Care

## 2018-10-05 SURGERY — ARTHROPLASTY, SHOULDER, TOTAL
Anesthesia: Choice | Laterality: Right

## 2018-10-17 ENCOUNTER — Other Ambulatory Visit: Payer: Self-pay | Admitting: Osteopathic Medicine

## 2018-10-18 MED FILL — SODIUM SULFACETAMIDE 10% WA: 10 | 25 days supply | Qty: 177 | Fill #0

## 2018-11-10 ENCOUNTER — Encounter: Payer: Self-pay | Admitting: Osteopathic Medicine

## 2018-11-10 ENCOUNTER — Encounter: Payer: Self-pay | Admitting: Family Medicine

## 2018-11-10 ENCOUNTER — Ambulatory Visit (INDEPENDENT_AMBULATORY_CARE_PROVIDER_SITE_OTHER): Payer: Managed Care, Other (non HMO) | Admitting: Family Medicine

## 2018-11-10 ENCOUNTER — Other Ambulatory Visit: Payer: Self-pay

## 2018-11-10 VITALS — BP 113/81 | HR 82 | Temp 97.6°F | Wt 175.0 lb

## 2018-11-10 DIAGNOSIS — M654 Radial styloid tenosynovitis [de Quervain]: Secondary | ICD-10-CM | POA: Diagnosis not present

## 2018-11-10 MED ORDER — PREDNISONE 50 MG PO TABS: 50.0000 mg | ORAL_TABLET | Freq: Every day | ORAL | 0 refills | Status: DC

## 2018-11-10 NOTE — Progress Notes (Signed)
Kayla Lindsey is a 53 y.o. female who presents to [facility] today for left wrist pain. Pain present for about 3 weeks.  Patient notes pain at the radial wrist at the radial styloid.  Pain is worse with wrist motion.  She cannot recall any specific injury or change in activity.  She does have a new job which is less demanding of her wrist and hand than her previous job.  She did some reading and is concerned she may have de Quervain's tenosynovitis.  She did her Wynn [place] test at home which was positive.  She notes pain at the styloid radiating up the forearm at times.    Past medical history concerning for mastocytosis intolerant of oral NSAIDs but tolerant of topical diclofenac gel.  Additionally she recent was worked up for possible rheumatologic disorder with positive ANA and suppressed TSH.  She has tried some Tylenol which helps a little.  ROS:  As above  Exam:  BP 113/81   Pulse 82   Temp 97.6 F (36.4 C) (Oral)   Wt 175 lb (79.4 kg)   BMI 27.41 kg/m  Wt Readings from Last 5 Encounters:  11/10/18 175 lb (79.4 kg)  10/03/18 173 lb (78.5 kg)  09/15/18 167 lb (75.8 kg)  09/13/18 167 lb (75.8 kg)  09/12/18 167 lb (75.8 kg)   General: Well Developed, well nourished, and in no acute distress.  Neuro/Psych: Alert and oriented x3, extra-ocular muscles intact, able to move all 4 extremities, sensation grossly intact. Skin: Warm and dry, no rashes noted.  Respiratory: Not using accessory muscles, speaking in full sentences, trachea midline.  Cardiovascular: Pulses palpable, no extremity edema. Abdomen: Does not appear distended. MSK:  Left wrist some swelling at radial styloid. Tender palpation overlying radial styloid. Normal wrist motion pain with ulnar deviation.  Significantly positive Finkelstein's test. Intact strength.  Pulses cap refill and sensation are intact distally.    Lab and Radiology Results Limited  musculoskeletal ultrasound left first dorsal wrist compartment. Intact tendons however increased hypoechoic fluid in tendon sheath.  Tiny 1 mm ganglion cyst superficial to tendon sheath present.  Tender to palpation. Normal bony structures.  Lab Results  Component Value Date   TSH 0.30 (L) 09/13/2018   Lab Results  Component Value Date   ANA POSITIVE (A) 09/13/2018     Assessment and Plan: 53 y.o. female with de Quervain's tenosynovitis. Unclear etiology however patient does have recent work-up concerning for suppressed TSH and elevated ANA.  Discussed treatment options.  Plan for diclofenac gel thumb spica splint and short course of prednisone.  Also proceed with home exercise program if not better next up would be injection and likely hand therapy.  Recheck if not improving.   PDMP not reviewed this encounter. No orders of the defined types were placed in this encounter.  Meds ordered this encounter  Medications  . predniSONE (DELTASONE) 50 MG tablet    Sig: Take 1 tablet (50 mg total) by mouth daily.    Dispense:  5 tablet    Refill:  0    Historical information moved to improve visibility of documentation.  Past Medical History:  Diagnosis Date  . Anxiety   . Depression   . Hashimoto's thyroiditis   . Hypothyroidism   . Mastocytosis   . Osteoarthritis    right shoulder   Past Surgical History:  Procedure Laterality Date  . APPENDECTOMY    . BREAST BIOPSY Bilateral   . BREAST  LUMPECTOMY Right   . carpel tunnel    . CHOLECYSTECTOMY    . HIP SURGERY    . SHOULDER ACROMIOPLASTY     Social History   Tobacco Use  . Smoking status: Never Smoker  . Smokeless tobacco: Never Used  Substance Use Topics  . Alcohol use: Yes    Frequency: Never    Comment: social   family history includes COPD in her father and mother; Cancer in her mother; Esophageal cancer in her mother; Heart disease in her mother; Thyroid disease in her mother.  Medications: Current  Outpatient Medications  Medication Sig Dispense Refill  . Azelaic Acid 15 % cream Apply 1 application topically 2 (two) times daily. After skin is thoroughly washed and patted dry 50 g 11  . doxycycline (VIBRA-TABS) 100 MG tablet Take 1 tablet (100 mg total) by mouth 2 (two) times daily. 20 tablet 0  . fexofenadine (ALLEGRA) 180 MG tablet Take 180 mg by mouth daily.    Marland Kitchen levothyroxine (SYNTHROID) 112 MCG tablet Take 1 tablet (112 mcg total) by mouth daily before breakfast. 90 tablet 0  . Sulfacetamide in Bakuchiol (SODIUM SULFACETAMIDE WASH) 10 % LIQD Apply 1 application topically 2 (two) times daily as needed. 480 mL 5  . Sulfacetamide Sodium 10 % LIQD APPLY TO FACE TOPICALLY TWICE A DAY AS FACE Lisman. 177 mL 1  . predniSONE (DELTASONE) 50 MG tablet Take 1 tablet (50 mg total) by mouth daily. 5 tablet 0  . venlafaxine XR (EFFEXOR-XR) 150 MG 24 hr capsule Take 1 capsule (150 mg total) by mouth daily with breakfast. 90 capsule 1   No current facility-administered medications for this visit.    No Known Allergies    Discussed warning signs or symptoms. Please see discharge instructions. Patient expresses understanding.

## 2018-11-10 NOTE — Patient Instructions (Signed)
Thank you for coming in today. Use the diclofenac gel on the wrist up to 4x daily. Let me know if you need a refill. Use a thumb spica splint or brace.  You should be able to get one [facility] like at [facility].  Take the prednisone course.  Do the home exercises when able.  If not better next step is injection.

## 2018-11-10 NOTE — Telephone Encounter (Signed)
Called pt and scheduled for today with ortho

## 2018-11-22 MED FILL — predniSONE 5 MG TABS: 5 | 46 days supply | Qty: 70 | Fill #0

## 2018-11-22 MED FILL — LEVOTHYROXINE 125 MCG TAB: 125 | 90 days supply | Qty: 90 | Fill #0

## 2018-11-23 MED FILL — HYDROXYCHLOROQUINE 200 MG T: 200 | 15 days supply | Qty: 30 | Fill #0

## 2018-11-29 MED FILL — HYDROXYCHLOROQUINE 200 MG T: 200 | 90 days supply | Qty: 180 | Fill #1

## 2018-12-22 MED FILL — VENLAFAXINE HCL ER 150 MG C: 150 | 90 days supply | Qty: 90 | Fill #0

## 2019-01-27 IMAGING — MG DIGITAL SCREENING BILATERAL MAMMOGRAM WITH TOMO AND CAD
6 of 10 series · 6 of 30 positions shown · non-contrast
Comparison: None.

CLINICAL DATA: Screening.

EXAM:
DIGITAL SCREENING BILATERAL MAMMOGRAM WITH TOMO AND CAD

[L MLO synth-2D]
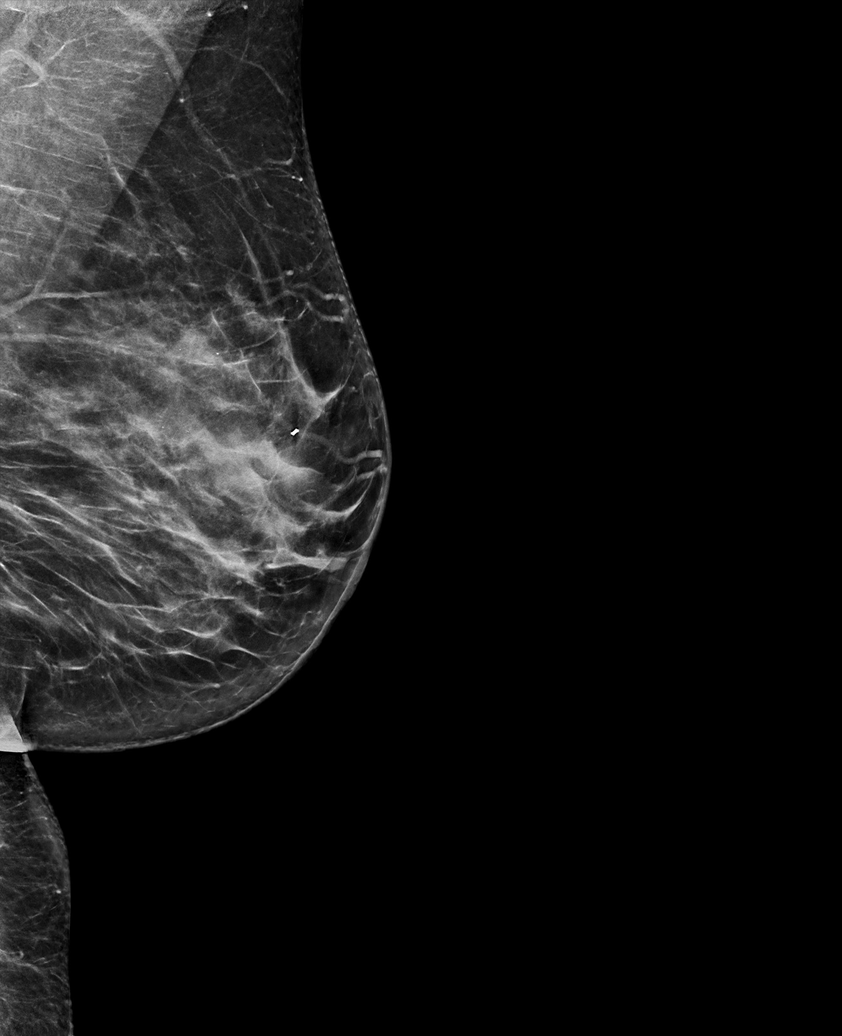

[R CC synth-2D]
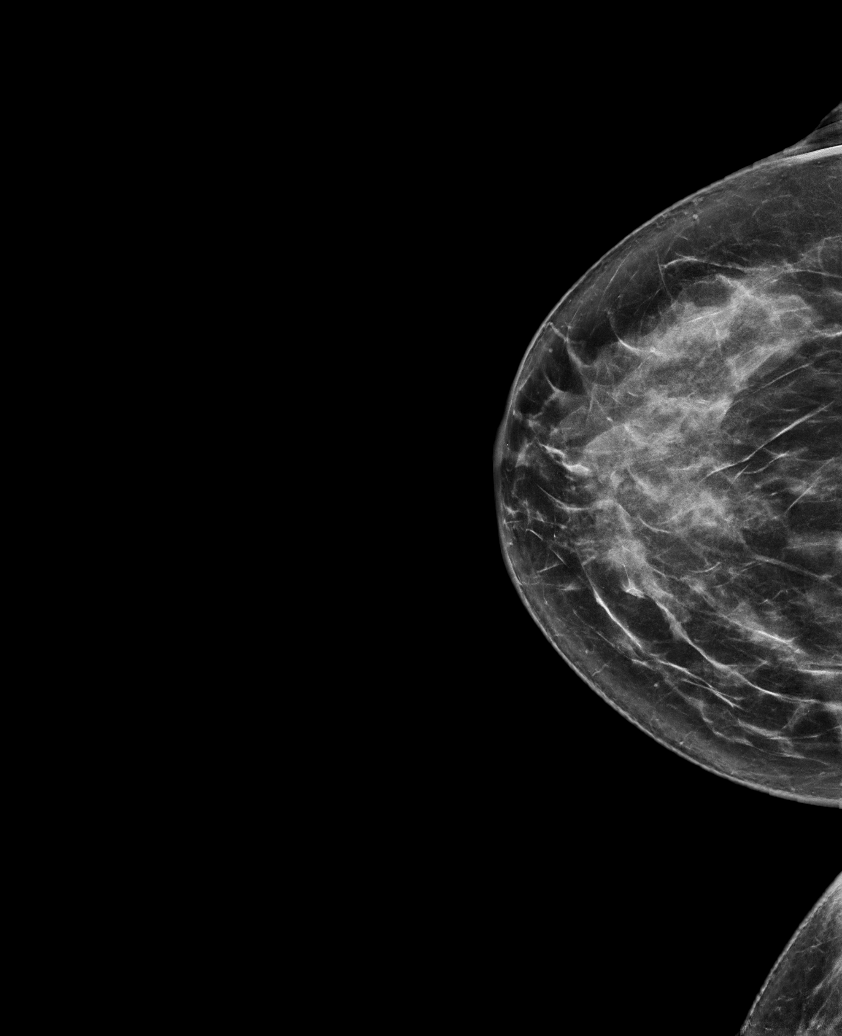

[L XCCL synth-2D]
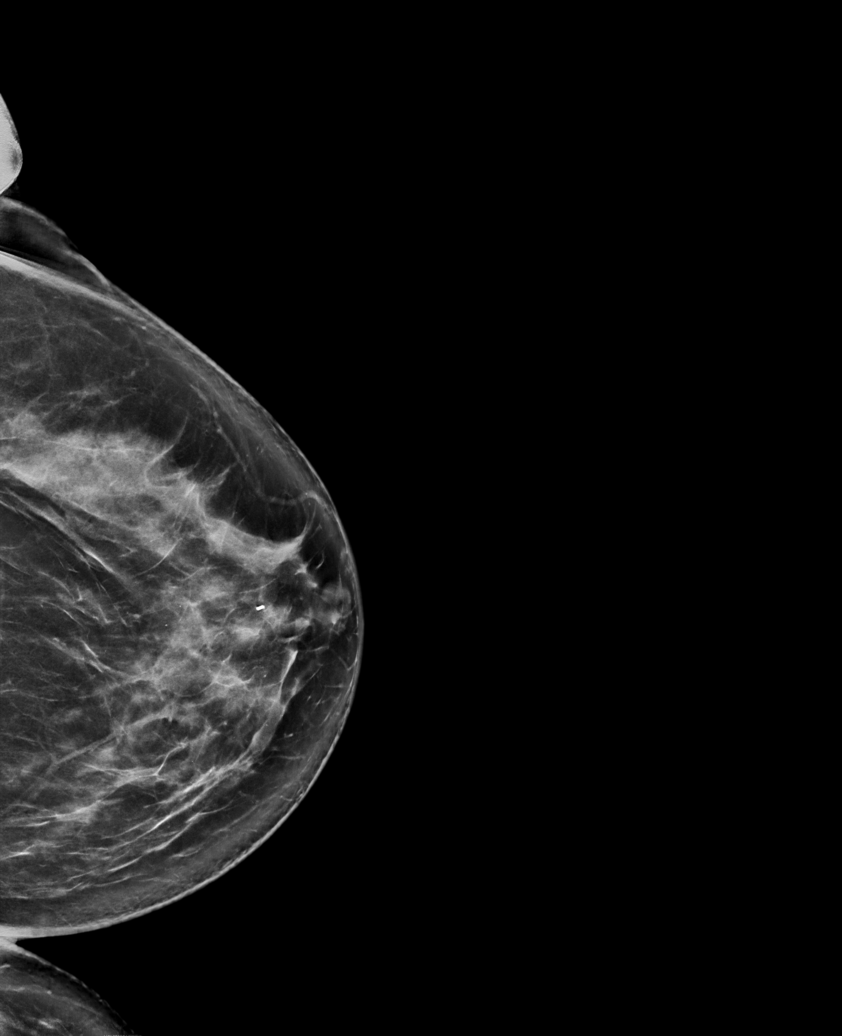

[L CC synth-2D]
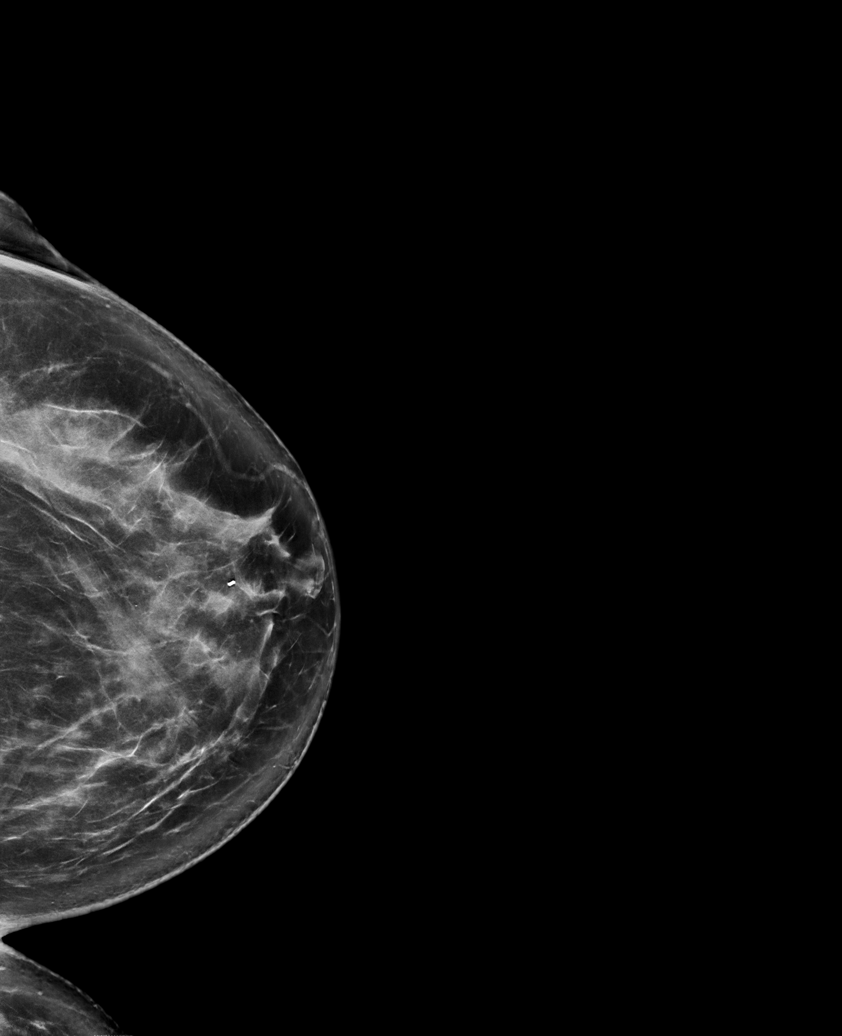

[R MLO synth-2D]
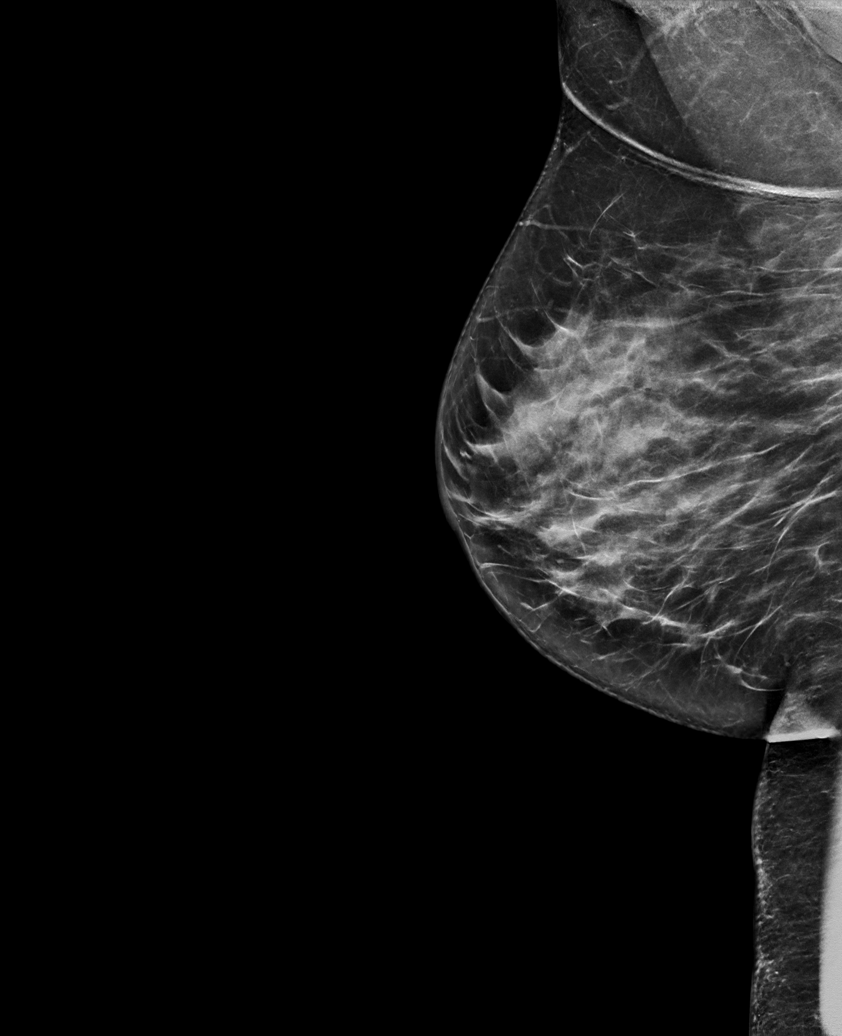

[L MLO tomo · tomo slice 38/75.0]
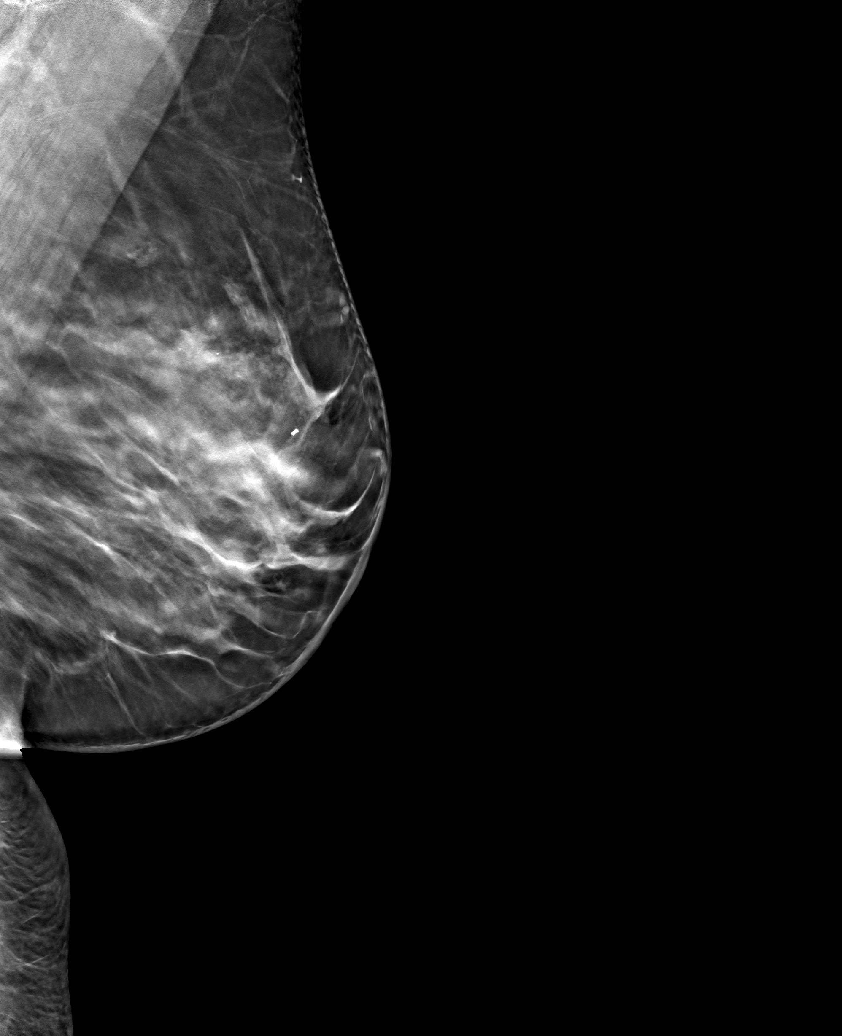

[6 of 30 positions shown; findings below may reference images not displayed]

ACR Breast Density Category c: The breast tissue is heterogeneously
dense, which may obscure small masses
FINDINGS: There are no findings suspicious for malignancy. Images were
processed with CAD.
IMPRESSION: No mammographic evidence of malignancy. A result letter of this
screening mammogram will be mailed directly to the patient.

RECOMMENDATION:
Screening mammogram in one year. (Code:EM-2-IHY)

BI-RADS CATEGORY  1: Negative.

## 2019-02-01 ENCOUNTER — Encounter: Payer: Managed Care, Other (non HMO) | Admitting: Osteopathic Medicine

## 2019-04-05 NOTE — Progress Notes (Signed)
Pre-op call attempted. No answer. Lmtcb.

## 2019-04-06 ENCOUNTER — Other Ambulatory Visit: Payer: Self-pay

## 2019-04-06 ENCOUNTER — Encounter (HOSPITAL_BASED_OUTPATIENT_CLINIC_OR_DEPARTMENT_OTHER): Payer: Self-pay | Admitting: Orthopaedic Surgery

## 2019-04-12 ENCOUNTER — Other Ambulatory Visit: Payer: Self-pay

## 2019-04-12 ENCOUNTER — Encounter (HOSPITAL_BASED_OUTPATIENT_CLINIC_OR_DEPARTMENT_OTHER): Payer: Self-pay | Admitting: Orthopaedic Surgery

## 2019-04-13 NOTE — H&P (Signed)
PREOPERATIVE H&P  Chief Complaint: OA RIGHT SHOULDER  HPI: Kayla Lindsey is a 54 y.o. female who is scheduled for right TOTAL SHOULDER ARTHROPLASTY.   Patient has a past medical history significant for hypothyroidism, rheumatoid arthritis on chronic steroids, and mastocytosis.   Patient has a long history of right shoulder pain. She does have a history of what looks to be a single anchor rotator cuff repair done at [facility] in [place] in 2012 and a biceps tenodesis in January 2018 in [place]. She has pain and limitations in range of motion and she has pain with over-activity.  She has had tried injections and has had minimal relief from this. She had a right shoulder arthroscopy with debridement, subacromial decompression, distal clavicle excision, and loose body removal in January 2020, but she did not make much progress with this. Unfortunately she still has continued pain, though her sharp pains from the loose body are gone.  Her symptoms are rated as moderate to severe, and have been worsening.  This is significantly impairing activities of daily living.    Please see clinic note for further details on this patient's care.    She has elected for surgical management.   Past Medical History:  Diagnosis Date  . Anxiety   . Depression   . Hashimoto's thyroiditis    hypo  . Hypothyroidism   . Mastocytosis   . Osteoarthritis    right shoulder  . Rheumatoid arthritis ([place])    mgd by Dr Jillyn Ledger, Maralyn Sago   . Vitiligo    Past Surgical History:  Procedure Laterality Date  . APPENDECTOMY    . BREAST BIOPSY Bilateral   . BREAST LUMPECTOMY Right   . carpel tunnel Bilateral   . CHOLECYSTECTOMY    . HIP SURGERY Left 02/2007   laboral repair   . SHOULDER ACROMIOPLASTY Right    Social History   Socioeconomic History  . Marital status: Married    Spouse name: Not on file  . Number of children: Not on file  . Years of education: Not on file  . Highest education level:  Not on file  Occupational History  . Occupation: Therapist, sports: [employer]  Tobacco Use  . Smoking status: Former Smoker    Types: Cigarettes    Quit date: 12/13/2006    Years since quitting: 12.3  . Smokeless tobacco: Never Used  Substance and Sexual Activity  . Alcohol use: Yes    Comment: rarely   . Drug use: Not Currently  . Sexual activity: Yes    Partners: Male    Birth control/protection: Other-see comments    Comment: Vasectomy  Other Topics Concern  . Not on file  Social History Narrative  . Not on file   Social Determinants of Health   Financial Resource Strain:   . Difficulty of Paying Living Expenses: Not on file  Food Insecurity:   . Worried About Charity fundraiser in the Last Year: Not on file  . Ran Out of Food in the Last Year: Not on file  Transportation Needs:   . Lack of Transportation (Medical): Not on file  . Lack of Transportation (Non-Medical): Not on file  Physical Activity:   . Days of Exercise per Week: Not on file  . Minutes of Exercise per Session: Not on file  Stress:   . Feeling of Stress : Not on file  Social Connections:   . Frequency of Communication with Friends and Family: Not  on file  . Frequency of Social Gatherings with Friends and Family: Not on file  . Attends Religious Services: Not on file  . Active Member of Clubs or Organizations: Not on file  . Attends Archivist Meetings: Not on file  . Marital Status: Not on file   Family History  Problem Relation Age of Onset  . Thyroid disease Mother   . Heart disease Mother   . COPD Mother   . Cancer Mother   . Esophageal cancer Mother   . COPD Father    No Known Allergies Prior to Admission medications   Medication Sig Start Date End Date Taking? Authorizing Provider  Azelaic Acid 15 % cream Apply 1 application topically 2 (two) times daily. After skin is thoroughly washed and patted dry 10/03/18  Yes Hali Marry, MD  Biotin 10000 MCG TABS  Take by mouth.   Yes [provider]  buPROPion (WELLBUTRIN) 75 MG tablet Take 75 mg by mouth 2 (two) times daily.   Yes [provider]  fexofenadine (ALLEGRA) 180 MG tablet Take 180 mg by mouth daily.   Yes [provider]  folic acid (FOLVITE) Q000111Q MCG tablet Take 400 mcg by mouth daily.   Yes [provider]  hydroxychloroquine (PLAQUENIL) 200 MG tablet Take by mouth 2 (two) times daily.   Yes [provider]  levothyroxine (SYNTHROID) 112 MCG tablet Take 1 tablet (112 mcg total) by mouth daily before breakfast. Patient taking differently: Take 0.125 mcg by mouth daily before breakfast.  09/15/18  Yes Emeterio Reeve, DO  predniSONE (DELTASONE) 50 MG tablet Take 1 tablet (50 mg total) by mouth daily. Patient taking differently: Take 5 mg by mouth daily.  11/10/18  Yes Gregor Hams, MD  Sulfacetamide Sodium 10 % LIQD APPLY TO FACE TOPICALLY TWICE A DAY AS FACE [place]. 10/18/18  Yes Emeterio Reeve, DO  venlafaxine XR (EFFEXOR-XR) 150 MG 24 hr capsule Take 1 capsule (150 mg total) by mouth daily with breakfast. 02/21/18 04/06/19 Yes Emeterio Reeve, DO  ARIPiprazole (ABILIFY) 2 MG tablet Take 2 mg by mouth daily.    [provider]  doxycycline (VIBRA-TABS) 100 MG tablet Take 1 tablet (100 mg total) by mouth 2 (two) times daily. 10/03/18   Hali Marry, MD  Sulfacetamide in Bakuchiol (SODIUM SULFACETAMIDE [place]) 10 % LIQD Apply 1 application topically 2 (two) times daily as needed. 10/03/18   Hali Marry, MD    ROS: All other systems have been reviewed and were otherwise negative with the exception of those mentioned in the HPI and as above.  Physical Exam: General: Alert, no acute distress Cardiovascular: No pedal edema Respiratory: No cyanosis, no use of accessory musculature GI: No organomegaly, abdomen is soft and non-tender Skin: No lesions in the area of chief complaint Neurologic: Sensation intact  distally Psychiatric: Patient is competent for consent with normal mood and affect Lymphatic: No axillary or cervical lymphadenopathy  MUSCULOSKELETAL:  Right shoulder: Active forward elevation to 170, external rotation to 70, internal rotation to the back pocket.  Cuff strength is intact.    Imaging: Previous X-rays and MRI in [place] demonstrate on arthrogram, a relatively preserved cuff with sequelae of her previous surgery.  There is some mild tendinosis, but no obvious full-thickness tear.  In regard to the biceps, it is clear there has been a unicortical tenodesis with a button.  The labrum otherwise appears normal.  She does have some arthrosis at the [facility] joint, both noted  on MRI as well as on x-ray.  The patient clearly has early osteophytosis and cartilage damage with chondral changes throughout her glenohumeral joint noted on MRI as well as on x-ray.  Assessment: OA RIGHT SHOULDER  Plan: Plan for Procedure(s): TOTAL SHOULDER ARTHROPLASTY  The risks benefits and alternatives were discussed with the patient including but not limited to the risks of nonoperative treatment, versus surgical intervention including infection, bleeding, nerve injury,  blood clots, cardiopulmonary complications, morbidity, mortality, among others, and they were willing to proceed.   We additionally specifically discussed risks of axillary nerve injury, infection, periprosthetic fracture, continued pain and longevity of implants prior to beginning procedure.    The patient is planning to be discharged home with outpatient PT.   The patient acknowledged the explanation, agreed to proceed with the plan and consent was signed.   Operative Plan: Right total shoulder arthroplasty  Discharge Medications: Standard DVT Prophylaxis: None Physical Therapy: Outpatient PT Special Discharge needs: Sling   Ethelda Chick, PA-C  04/13/2019 12:18 PM

## 2019-04-17 ENCOUNTER — Other Ambulatory Visit (HOSPITAL_COMMUNITY)
Admission: RE | Admit: 2019-04-17 | Discharge: 2019-04-17 | Disposition: A | Discharge status: Home / Self Care | Payer: Managed Care, Other (non HMO) | Source: Ambulatory Visit | Attending: Orthopaedic Surgery | Admitting: Orthopaedic Surgery

## 2019-04-17 ENCOUNTER — Other Ambulatory Visit: Payer: Self-pay

## 2019-04-17 ENCOUNTER — Encounter (HOSPITAL_BASED_OUTPATIENT_CLINIC_OR_DEPARTMENT_OTHER)
Admission: RE | Admit: 2019-04-17 | Discharge: 2019-04-17 | Disposition: A | Discharge status: Home / Self Care | Payer: Managed Care, Other (non HMO) | Source: Ambulatory Visit | Attending: Orthopaedic Surgery | Admitting: Orthopaedic Surgery

## 2019-04-17 DIAGNOSIS — Z87891 Personal history of nicotine dependence: Secondary | ICD-10-CM | POA: Diagnosis not present

## 2019-04-17 DIAGNOSIS — Z01812 Encounter for preprocedural laboratory examination: Secondary | ICD-10-CM | POA: Diagnosis not present

## 2019-04-17 DIAGNOSIS — Z79899 Other long term (current) drug therapy: Secondary | ICD-10-CM | POA: Diagnosis not present

## 2019-04-17 DIAGNOSIS — Z8249 Family history of ischemic heart disease and other diseases of the circulatory system: Secondary | ICD-10-CM | POA: Diagnosis not present

## 2019-04-17 DIAGNOSIS — F419 Anxiety disorder, unspecified: Secondary | ICD-10-CM | POA: Diagnosis not present

## 2019-04-17 DIAGNOSIS — Z800 Family history of malignant neoplasm of digestive organs: Secondary | ICD-10-CM | POA: Diagnosis not present

## 2019-04-17 DIAGNOSIS — E039 Hypothyroidism, unspecified: Secondary | ICD-10-CM | POA: Diagnosis not present

## 2019-04-17 DIAGNOSIS — Z7952 Long term (current) use of systemic steroids: Secondary | ICD-10-CM | POA: Diagnosis not present

## 2019-04-17 DIAGNOSIS — Z20822 Contact with and (suspected) exposure to covid-19: Secondary | ICD-10-CM | POA: Diagnosis not present

## 2019-04-17 DIAGNOSIS — Z8349 Family history of other endocrine, nutritional and metabolic diseases: Secondary | ICD-10-CM | POA: Diagnosis not present

## 2019-04-17 DIAGNOSIS — F329 Major depressive disorder, single episode, unspecified: Secondary | ICD-10-CM | POA: Diagnosis not present

## 2019-04-17 DIAGNOSIS — M19011 Primary osteoarthritis, right shoulder: Secondary | ICD-10-CM | POA: Diagnosis not present

## 2019-04-17 DIAGNOSIS — Z825 Family history of asthma and other chronic lower respiratory diseases: Secondary | ICD-10-CM | POA: Diagnosis not present

## 2019-04-17 DIAGNOSIS — Z9049 Acquired absence of other specified parts of digestive tract: Secondary | ICD-10-CM | POA: Diagnosis not present

## 2019-04-17 DIAGNOSIS — M069 Rheumatoid arthritis, unspecified: Secondary | ICD-10-CM | POA: Diagnosis not present

## 2019-04-17 DIAGNOSIS — L80 Vitiligo: Secondary | ICD-10-CM | POA: Diagnosis not present

## 2019-04-17 DIAGNOSIS — Z809 Family history of malignant neoplasm, unspecified: Secondary | ICD-10-CM | POA: Diagnosis not present

## 2019-04-17 DIAGNOSIS — D4709 Other mast cell neoplasms of uncertain behavior: Secondary | ICD-10-CM | POA: Diagnosis not present

## 2019-04-17 DIAGNOSIS — E063 Autoimmune thyroiditis: Secondary | ICD-10-CM | POA: Diagnosis not present

## 2019-04-17 LAB — SURGICAL PCR SCREEN
MRSA, PCR: NEGATIVE
Staphylococcus aureus: NEGATIVE

## 2019-04-17 LAB — SARS CORONAVIRUS 2 (TAT 6-24 HRS): SARS Coronavirus 2: NEGATIVE

## 2019-04-17 NOTE — Progress Notes (Signed)
      Enhanced Recovery after Surgery for Orthopedics Enhanced Recovery after Surgery is a protocol used to improve the stress on your body and your recovery after surgery.  Patient Instructions  . The night before surgery:  o No food after midnight. ONLY clear liquids after midnight  . The day of surgery (if you do NOT have diabetes):  o Drink ONE (1) Pre-Surgery Clear Ensure as directed.   o This drink was given to you during your hospital  pre-op appointment visit. o The pre-op nurse will instruct you on the time to drink the  Pre-Surgery Ensure depending on your surgery time. o Finish the drink at the designated time by the pre-op nurse.  o Nothing else to drink after completing the  Pre-Surgery Clear Ensure.  . The day of surgery (if you have diabetes): o Drink ONE (1) Gatorade 2 (G2) as directed. o This drink was given to you during your hospital  pre-op appointment visit.  o The pre-op nurse will instruct you on the time to drink the   Gatorade 2 (G2) depending on your surgery time. o Color of the Gatorade may vary. Red is not allowed. o Nothing else to drink after completing the  Gatorade 2 (G2).         If you have questions, please contact your surgeon's office. Surgical soap given with instructions, pt verbalized understanding. Benzoyl peroxide gel given with written instructions, pt verbalized understanding.  

## 2019-04-19 MED FILL — LEVOTHYROXINE SODIUM 125 MC: 125 | 90 days supply | Qty: 90 | Fill #1

## 2019-04-20 ENCOUNTER — Ambulatory Visit (HOSPITAL_BASED_OUTPATIENT_CLINIC_OR_DEPARTMENT_OTHER): Payer: Managed Care, Other (non HMO) | Admitting: Anesthesiology

## 2019-04-20 ENCOUNTER — Other Ambulatory Visit: Payer: Self-pay

## 2019-04-20 ENCOUNTER — Encounter (HOSPITAL_BASED_OUTPATIENT_CLINIC_OR_DEPARTMENT_OTHER): Payer: Self-pay | Admitting: Orthopaedic Surgery

## 2019-04-20 ENCOUNTER — Ambulatory Visit (HOSPITAL_COMMUNITY): Payer: Managed Care, Other (non HMO)

## 2019-04-20 ENCOUNTER — Encounter (HOSPITAL_BASED_OUTPATIENT_CLINIC_OR_DEPARTMENT_OTHER)
Admission: RE | Disposition: A | Discharge status: Home / Self Care | Payer: Self-pay | Source: Home / Self Care | Attending: Orthopaedic Surgery

## 2019-04-20 ENCOUNTER — Ambulatory Visit (HOSPITAL_BASED_OUTPATIENT_CLINIC_OR_DEPARTMENT_OTHER)
Admission: RE | Admit: 2019-04-20 | Discharge: 2019-04-20 | Disposition: A | Discharge status: Home / Self Care | Payer: Managed Care, Other (non HMO) | Attending: Orthopaedic Surgery | Admitting: Orthopaedic Surgery

## 2019-04-20 DIAGNOSIS — E039 Hypothyroidism, unspecified: Secondary | ICD-10-CM | POA: Insufficient documentation

## 2019-04-20 DIAGNOSIS — Z8249 Family history of ischemic heart disease and other diseases of the circulatory system: Secondary | ICD-10-CM | POA: Insufficient documentation

## 2019-04-20 DIAGNOSIS — L80 Vitiligo: Secondary | ICD-10-CM | POA: Insufficient documentation

## 2019-04-20 DIAGNOSIS — Z7952 Long term (current) use of systemic steroids: Secondary | ICD-10-CM | POA: Insufficient documentation

## 2019-04-20 DIAGNOSIS — M19011 Primary osteoarthritis, right shoulder: Secondary | ICD-10-CM | POA: Insufficient documentation

## 2019-04-20 DIAGNOSIS — F419 Anxiety disorder, unspecified: Secondary | ICD-10-CM | POA: Insufficient documentation

## 2019-04-20 DIAGNOSIS — Z9049 Acquired absence of other specified parts of digestive tract: Secondary | ICD-10-CM | POA: Insufficient documentation

## 2019-04-20 DIAGNOSIS — F329 Major depressive disorder, single episode, unspecified: Secondary | ICD-10-CM | POA: Insufficient documentation

## 2019-04-20 DIAGNOSIS — Z809 Family history of malignant neoplasm, unspecified: Secondary | ICD-10-CM | POA: Insufficient documentation

## 2019-04-20 DIAGNOSIS — D4709 Other mast cell neoplasms of uncertain behavior: Secondary | ICD-10-CM | POA: Insufficient documentation

## 2019-04-20 DIAGNOSIS — Z79899 Other long term (current) drug therapy: Secondary | ICD-10-CM | POA: Insufficient documentation

## 2019-04-20 DIAGNOSIS — E063 Autoimmune thyroiditis: Secondary | ICD-10-CM | POA: Insufficient documentation

## 2019-04-20 DIAGNOSIS — Z8349 Family history of other endocrine, nutritional and metabolic diseases: Secondary | ICD-10-CM | POA: Insufficient documentation

## 2019-04-20 DIAGNOSIS — Z800 Family history of malignant neoplasm of digestive organs: Secondary | ICD-10-CM | POA: Insufficient documentation

## 2019-04-20 DIAGNOSIS — Z825 Family history of asthma and other chronic lower respiratory diseases: Secondary | ICD-10-CM | POA: Insufficient documentation

## 2019-04-20 DIAGNOSIS — M069 Rheumatoid arthritis, unspecified: Secondary | ICD-10-CM | POA: Insufficient documentation

## 2019-04-20 DIAGNOSIS — Z87891 Personal history of nicotine dependence: Secondary | ICD-10-CM | POA: Insufficient documentation

## 2019-04-20 DIAGNOSIS — Z09 Encounter for follow-up examination after completed treatment for conditions other than malignant neoplasm: Secondary | ICD-10-CM

## 2019-04-20 HISTORY — DX: Vitiligo: L80

## 2019-04-20 HISTORY — PX: TOTAL SHOULDER ARTHROPLASTY: SHX126

## 2019-04-20 HISTORY — DX: Rheumatoid arthritis, unspecified: M06.9

## 2019-04-20 LAB — POCT PREGNANCY, URINE: Preg Test, Ur: NEGATIVE

## 2019-04-20 SURGERY — ARTHROPLASTY, SHOULDER, TOTAL
Anesthesia: General | Site: Shoulder | Laterality: Right

## 2019-04-20 MED ORDER — DEXAMETHASONE SODIUM PHOSPHATE 10 MG/ML IJ SOLN
INTRAMUSCULAR | Status: AC
  Filled 2019-04-20: qty 1

## 2019-04-20 MED ORDER — LIDOCAINE 2% (20 MG/ML) 5 ML SYRINGE
INTRAMUSCULAR | Status: DC | PRN
  Administered 2019-04-20: 80 mg via INTRAVENOUS

## 2019-04-20 MED ORDER — BUPIVACAINE LIPOSOME 1.3 % IJ SUSP
INTRAMUSCULAR | Status: DC | PRN
  Administered 2019-04-20: 10 mL via PERINEURAL

## 2019-04-20 MED ORDER — BUPIVACAINE HCL (PF) 0.5 % IJ SOLN
INTRAMUSCULAR | Status: DC | PRN
  Administered 2019-04-20: 15 mL via PERINEURAL

## 2019-04-20 MED ORDER — EPINEPHRINE PF 1 MG/ML IJ SOLN
INTRAMUSCULAR | Status: AC
  Filled 2019-04-20: qty 4

## 2019-04-20 MED ORDER — TRANEXAMIC ACID-NACL 1000-0.7 MG/100ML-% IV SOLN
INTRAVENOUS | Status: AC
  Filled 2019-04-20: qty 100

## 2019-04-20 MED ORDER — FENTANYL CITRATE (PF) 100 MCG/2ML IJ SOLN
50.0000 ug | INTRAMUSCULAR | Status: DC | PRN
  Administered 2019-04-20: 50 ug via INTRAVENOUS

## 2019-04-20 MED ORDER — FENTANYL CITRATE (PF) 100 MCG/2ML IJ SOLN
INTRAMUSCULAR | Status: AC
  Filled 2019-04-20: qty 2

## 2019-04-20 MED ORDER — CHLORHEXIDINE GLUCONATE 4 % EX LIQD: 60.0000 mL | Freq: Once | CUTANEOUS | Status: DC

## 2019-04-20 MED ORDER — PHENYLEPHRINE 40 MCG/ML (10ML) SYRINGE FOR IV PUSH (FOR BLOOD PRESSURE SUPPORT)
PREFILLED_SYRINGE | INTRAVENOUS | Status: DC | PRN
  Administered 2019-04-20: 80 ug via INTRAVENOUS

## 2019-04-20 MED ORDER — LIDOCAINE 2% (20 MG/ML) 5 ML SYRINGE
INTRAMUSCULAR | Status: AC
  Filled 2019-04-20: qty 5

## 2019-04-20 MED ORDER — FENTANYL CITRATE (PF) 100 MCG/2ML IJ SOLN
25.0000 ug | INTRAMUSCULAR | Status: DC | PRN
  Administered 2019-04-20: 50 ug via INTRAVENOUS

## 2019-04-20 MED ORDER — ROCURONIUM BROMIDE 10 MG/ML (PF) SYRINGE
PREFILLED_SYRINGE | INTRAVENOUS | Status: AC
  Filled 2019-04-20: qty 10

## 2019-04-20 MED ORDER — ONDANSETRON HCL 4 MG PO TABS: 4.0000 mg | ORAL_TABLET | Freq: Three times a day (TID) | ORAL | 1 refills | Status: AC | PRN

## 2019-04-20 MED ORDER — LACTATED RINGERS IV SOLN: INTRAVENOUS | Status: DC

## 2019-04-20 MED ORDER — ROCURONIUM BROMIDE 10 MG/ML (PF) SYRINGE
PREFILLED_SYRINGE | INTRAVENOUS | Status: DC | PRN
  Administered 2019-04-20: 60 mg via INTRAVENOUS

## 2019-04-20 MED ORDER — CEFAZOLIN SODIUM-DEXTROSE 2-4 GM/100ML-% IV SOLN
2.0000 g | INTRAVENOUS | Status: AC
  Administered 2019-04-20: 08:00:00 2 g via INTRAVENOUS

## 2019-04-20 MED ORDER — DEXAMETHASONE SODIUM PHOSPHATE 10 MG/ML IJ SOLN
INTRAMUSCULAR | Status: DC | PRN
  Administered 2019-04-20: 10 mg via INTRAVENOUS

## 2019-04-20 MED ORDER — MIDAZOLAM HCL 2 MG/2ML IJ SOLN
1.0000 mg | INTRAMUSCULAR | Status: DC | PRN
  Administered 2019-04-20: 07:00:00 2 mg via INTRAVENOUS

## 2019-04-20 MED ORDER — MELOXICAM 7.5 MG PO TABS: 7.5000 mg | ORAL_TABLET | Freq: Every day | ORAL | 2 refills | Status: DC

## 2019-04-20 MED ORDER — TRANEXAMIC ACID-NACL 1000-0.7 MG/100ML-% IV SOLN
1000.0000 mg | INTRAVENOUS | Status: AC
  Administered 2019-04-20: 08:00:00 1000 mg via INTRAVENOUS

## 2019-04-20 MED ORDER — ONDANSETRON HCL 4 MG/2ML IJ SOLN
INTRAMUSCULAR | Status: DC | PRN
  Administered 2019-04-20: 4 mg via INTRAVENOUS

## 2019-04-20 MED ORDER — 0.9 % SODIUM CHLORIDE (POUR BTL) OPTIME
TOPICAL | Status: DC | PRN
  Administered 2019-04-20: 08:00:00 1000 mL

## 2019-04-20 MED ORDER — PROPOFOL 10 MG/ML IV BOLUS
INTRAVENOUS | Status: DC | PRN
  Administered 2019-04-20: 50 mg via INTRAVENOUS
  Administered 2019-04-20: 150 mg via INTRAVENOUS

## 2019-04-20 MED ORDER — FENTANYL CITRATE (PF) 100 MCG/2ML IJ SOLN
INTRAMUSCULAR | Status: DC | PRN
  Administered 2019-04-20 (×2): 50 ug via INTRAVENOUS

## 2019-04-20 MED ORDER — MIDAZOLAM HCL 2 MG/2ML IJ SOLN
INTRAMUSCULAR | Status: AC
  Filled 2019-04-20: qty 2

## 2019-04-20 MED ORDER — PROPOFOL 10 MG/ML IV BOLUS
INTRAVENOUS | Status: AC
  Filled 2019-04-20: qty 20

## 2019-04-20 MED ORDER — SUGAMMADEX SODIUM 200 MG/2ML IV SOLN
INTRAVENOUS | Status: DC | PRN
  Administered 2019-04-20: 177.4 mg via INTRAVENOUS

## 2019-04-20 MED ORDER — PROMETHAZINE HCL 25 MG/ML IJ SOLN: 6.2500 mg | INTRAMUSCULAR | Status: DC | PRN

## 2019-04-20 MED ORDER — CEFAZOLIN SODIUM-DEXTROSE 2-4 GM/100ML-% IV SOLN
INTRAVENOUS | Status: AC
  Filled 2019-04-20: qty 100

## 2019-04-20 MED ORDER — OXYCODONE HCL 5 MG PO TABS: ORAL_TABLET | ORAL | 0 refills | Status: AC

## 2019-04-20 MED ORDER — VANCOMYCIN HCL POWD
Status: DC | PRN
  Administered 2019-04-20: 1000 mg

## 2019-04-20 MED ORDER — ACETAMINOPHEN 500 MG PO TABS: 1000.0000 mg | ORAL_TABLET | Freq: Three times a day (TID) | ORAL | 0 refills | Status: AC

## 2019-04-20 MED FILL — Vancomycin HCl For IV Soln 1 GM (Base Equivalent): INTRAVENOUS | Qty: 1000 | Status: AC

## 2019-04-20 SURGICAL SUPPLY — 83 items
BLADE HEX COATED 2.75 (ELECTRODE) ×3 IMPLANT
BLADE SAW SAG 29X58X.64 (BLADE) IMPLANT
BLADE SAW SAG 73X25 THK (BLADE) ×2
BLADE SAW SGTL 73X25 THK (BLADE) ×1 IMPLANT
BLADE SURG 10 STRL SS (BLADE) IMPLANT
BLADE SURG 15 STRL LF DISP TIS (BLADE) IMPLANT
BLADE SURG 15 STRL SS (BLADE)
CEMENT BONE DEPUY (Cement) ×3 IMPLANT
CHLORAPREP W/TINT 26 (MISCELLANEOUS) ×6 IMPLANT
CLOSURE STERI-STRIP 1/2X4 (GAUZE/BANDAGES/DRESSINGS) ×1
CLSR STERI-STRIP ANTIMIC 1/2X4 (GAUZE/BANDAGES/DRESSINGS) ×2 IMPLANT
COMP GLENOID AEQUALIS CL S30 (Shoulder) ×3 IMPLANT
COMPONENT GLENOD AEQLS CL S30 (Shoulder) IMPLANT
COMPONENT SZ1 NUCLEUS SHLDR (Miscellaneous) ×2 IMPLANT
COOLER ICEMAN CLASSIC (MISCELLANEOUS) ×1 IMPLANT
COVER BACK TABLE 60X90IN (DRAPES) ×3 IMPLANT
COVER MAYO STAND STRL (DRAPES) ×3 IMPLANT
COVER WAND RF STERILE (DRAPES) IMPLANT
DRAPE IMP U-DRAPE 54X76 (DRAPES) IMPLANT
DRAPE INCISE IOBAN 66X45 STRL (DRAPES) IMPLANT
DRAPE U-SHAPE 76X120 STRL (DRAPES) ×12 IMPLANT
DRSG AQUACEL AG ADV 3.5X 6 (GAUZE/BANDAGES/DRESSINGS) ×3 IMPLANT
DRSG OPSITE POSTOP 4X6 (GAUZE/BANDAGES/DRESSINGS) ×2 IMPLANT
ELECT BLADE 4.0 EZ CLEAN MEGAD (MISCELLANEOUS)
ELECT BLADE TIP CTD 4 INCH (ELECTRODE) ×3 IMPLANT
ELECT REM PT RETURN 9FT ADLT (ELECTROSURGICAL) ×3
ELECTRODE BLDE 4.0 EZ CLN MEGD (MISCELLANEOUS) IMPLANT
ELECTRODE REM PT RTRN 9FT ADLT (ELECTROSURGICAL) ×1 IMPLANT
GAUZE XEROFORM 1X8 LF (GAUZE/BANDAGES/DRESSINGS) IMPLANT
GLOVE BIO SURGEON STRL SZ 6.5 (GLOVE) ×2 IMPLANT
GLOVE BIO SURGEONS STRL SZ 6.5 (GLOVE) ×1
GLOVE BIOGEL PI IND STRL 6.5 (GLOVE) ×1 IMPLANT
GLOVE BIOGEL PI IND STRL 8 (GLOVE) ×1 IMPLANT
GLOVE BIOGEL PI INDICATOR 6.5 (GLOVE) ×2
GLOVE BIOGEL PI INDICATOR 8 (GLOVE) ×2
GLOVE ECLIPSE 8.0 STRL XLNG CF (GLOVE) ×3 IMPLANT
GOWN STRL REUS W/ TWL LRG LVL3 (GOWN DISPOSABLE) ×2 IMPLANT
GOWN STRL REUS W/TWL LRG LVL3 (GOWN DISPOSABLE) ×4
GOWN STRL REUS W/TWL XL LVL3 (GOWN DISPOSABLE) ×3 IMPLANT
GUIDE PIN 3X75 SHOULDER (PIN) ×3
GUIDEWIRE GLENOID 2.5X220 (WIRE) ×2 IMPLANT
HANDPIECE INTERPULSE COAX TIP (DISPOSABLE) ×2
IMPL HUMERAL HEAD 41X15 (Orthopedic Implant) IMPLANT
IMPLANT HUMERAL HEAD 41X15 (Orthopedic Implant) ×3 IMPLANT
KIT STABILIZATION SHOULDER (MISCELLANEOUS) ×3 IMPLANT
MANIFOLD NEPTUNE II (INSTRUMENTS) ×3 IMPLANT
NDL MAYO CATGUT SZ4 TPR NDL (NEEDLE) ×1 IMPLANT
NDL SUT .5 MAYO 1.404X.05X (NEEDLE) IMPLANT
NEEDLE MAYO CATGUT SZ4 (NEEDLE) ×3 IMPLANT
NEEDLE MAYO TAPER (NEEDLE) ×2
NS IRRIG 1000ML POUR BTL (IV SOLUTION) ×3 IMPLANT
PACK BASIN DAY SURGERY FS (CUSTOM PROCEDURE TRAY) ×3 IMPLANT
PACK SHOULDER (CUSTOM PROCEDURE TRAY) ×3 IMPLANT
PAD COLD SHLDR WRAP-ON (PAD) ×1 IMPLANT
PIN GUIDE 3X75 SHOULDER (PIN) IMPLANT
RESTRAINT HEAD UNIVERSAL NS (MISCELLANEOUS) ×3 IMPLANT
SET HNDPC FAN SPRY TIP SCT (DISPOSABLE) ×1 IMPLANT
SHEET MEDIUM DRAPE 40X70 STRL (DRAPES) ×3 IMPLANT
SLEEVE SCD COMPRESS KNEE MED (MISCELLANEOUS) ×3 IMPLANT
SLING ULTRA II MEDIUM (SOFTGOODS) ×2 IMPLANT
SMARTMIX MINI TOWER (MISCELLANEOUS) ×3
SPONGE LAP 18X18 RF (DISPOSABLE) IMPLANT
SUCTION FRAZIER HANDLE 10FR (MISCELLANEOUS) ×2
SUCTION TUBE FRAZIER 10FR DISP (MISCELLANEOUS) ×1 IMPLANT
SUT ETHIBOND 0 MO6 C/R (SUTURE) ×3 IMPLANT
SUT ETHIBOND 2 OS 4 DA (SUTURE) ×2 IMPLANT
SUT ETHIBOND 2 V 37 (SUTURE) IMPLANT
SUT ETHIBOND NAB CT1 #1 30IN (SUTURE) ×2 IMPLANT
SUT FIBERWIRE #2 38 REV NDL BL (SUTURE) ×3
SUT FIBERWIRE #5 38 CONV NDL (SUTURE) ×12
SUT MNCRL AB 4-0 PS2 18 (SUTURE) ×3 IMPLANT
SUT VIC AB 2-0 CT1 27 (SUTURE) ×2
SUT VIC AB 2-0 CT1 TAPERPNT 27 (SUTURE) ×1 IMPLANT
SUT VIC AB 3-0 CT1 27 (SUTURE) ×2
SUT VIC AB 3-0 CT1 TAPERPNT 27 (SUTURE) ×1 IMPLANT
SUT VIC AB 3-0 SH 27 (SUTURE) ×2
SUT VIC AB 3-0 SH 27X BRD (SUTURE) ×1 IMPLANT
SUTURE FIBERWR #5 38 CONV NDL (SUTURE) ×4 IMPLANT
SUTURE FIBERWR#2 38 REV NDL BL (SUTURE) IMPLANT
TOWEL GREEN STERILE FF (TOWEL DISPOSABLE) ×6 IMPLANT
TOWER CARTRIDGE SMART MIX (DISPOSABLE) IMPLANT
TOWER SMARTMIX MINI (MISCELLANEOUS) ×1 IMPLANT
TUBE SUCTION HIGH CAP CLEAR NV (SUCTIONS) ×3 IMPLANT

## 2019-04-20 NOTE — Anesthesia Procedure Notes (Addendum)
Procedure Name: Intubation Date/Time: 04/20/2019 7:38 AM Performed by: Myna Bright, CRNA Pre-anesthesia Checklist: Patient identified, Emergency Drugs available, Suction available and Patient being monitored Patient Re-evaluated:Patient Re-evaluated prior to induction Oxygen Delivery Method: Circle system utilized Preoxygenation: Pre-oxygenation with 100% oxygen Induction Type: IV induction Ventilation: Mask ventilation without difficulty Laryngoscope Size: Mac and 3 Grade View: Grade I Tube type: Oral Tube size: 7.0 mm Number of attempts: 1 Airway Equipment and Method: Stylet Placement Confirmation: positive ETCO2,  ETT inserted through vocal cords under direct vision and breath sounds checked- equal and bilateral Secured at: 21 cm Tube secured with: Tape Dental Injury: Teeth and Oropharynx as per pre-operative assessment

## 2019-04-20 NOTE — Anesthesia Procedure Notes (Signed)
Anesthesia Regional Block: Interscalene brachial plexus block   Pre-Anesthetic Checklist: ,, timeout performed, Correct Patient, Correct Site, Correct Laterality, Correct Procedure, Correct Position, site marked, Risks and benefits discussed,  Surgical consent,  Pre-op evaluation,  At surgeon's request and post-op pain management  Laterality: Right  Prep: chloraprep       Needles:  Injection technique: Single-shot  Needle Type: Echogenic Needle     Needle Length: 9cm      Additional Needles:   Procedures:,,,, ultrasound used (permanent image in chart),,,,  Narrative:  Start time: 04/20/2019 6:58 AM End time: 04/20/2019 7:04 AM Injection made incrementally with aspirations every 5 mL.  Performed by: Personally  Anesthesiologist: Myrtie Soman, MD  Additional Notes: Patient tolerated the procedure well without complications

## 2019-04-20 NOTE — Anesthesia Preprocedure Evaluation (Signed)
Anesthesia Evaluation  Patient identified by MRN, date of birth, ID band Patient awake    Reviewed: Allergy & Precautions, NPO status , Patient's Chart, lab work & pertinent test results  Airway Mallampati: II  TM Distance: >3 FB Neck ROM: Full    Dental no notable dental hx.    Pulmonary neg pulmonary ROS, former smoker,    Pulmonary exam normal breath sounds clear to auscultation       Cardiovascular negative cardio ROS Normal cardiovascular exam Rhythm:Regular Rate:Normal     Neuro/Psych negative neurological ROS  negative psych ROS   GI/Hepatic negative GI ROS, Neg liver ROS,   Endo/Other  Hypothyroidism   Renal/GU negative Renal ROS  negative genitourinary   Musculoskeletal  (+) Arthritis , Rheumatoid disorders and steroids,    Abdominal   Peds negative pediatric ROS (+)  Hematology negative hematology ROS (+)   Anesthesia Other Findings   Reproductive/Obstetrics negative OB ROS                             Anesthesia Physical Anesthesia Plan  ASA: III  Anesthesia Plan: General   Post-op Pain Management:  Regional for Post-op pain   Induction: Intravenous  PONV Risk Score and Plan: 3 and Ondansetron, Dexamethasone, Treatment may vary due to age or medical condition and Midazolam  Airway Management Planned: Oral ETT  Additional Equipment:   Intra-op Plan:   Post-operative Plan: Extubation in OR  Informed Consent: I have reviewed the patients History and Physical, chart, labs and discussed the procedure including the risks, benefits and alternatives for the proposed anesthesia with the patient or authorized representative who has indicated his/her understanding and acceptance.     Dental advisory given  Plan Discussed with: CRNA and Surgeon  Anesthesia Plan Comments:         Anesthesia Quick Evaluation

## 2019-04-20 NOTE — Anesthesia Procedure Notes (Signed)
Anesthesia Procedure Image    

## 2019-04-20 NOTE — Transfer of Care (Signed)
Immediate Anesthesia Transfer of Care Note  Patient: Kayla Lindsey  Procedure(s) Performed: TOTAL SHOULDER ARTHROPLASTY (Right Shoulder)  Patient Location: PACU  Anesthesia Type:GA combined with regional for post-op pain  Level of Consciousness: sedated and responds to stimulation  Airway & Oxygen Therapy: Patient Spontanous Breathing and Patient connected to face mask oxygen  Post-op Assessment: Report given to RN and Post -op Vital signs reviewed and stable  Post vital signs: Reviewed and stable  Last Vitals:  Vitals Value Taken Time  BP    Temp    Pulse 103 04/20/19 0948  Resp 22 04/20/19 0948  SpO2 95 % 04/20/19 0948  Vitals shown include unvalidated device data.  Last Pain:  Vitals:   04/20/19 0631  TempSrc: Oral  PainSc: 5       Patients Stated Pain Goal: 5 ([id number])  Complications: No apparent anesthesia complications

## 2019-04-20 NOTE — Interval H&P Note (Signed)
History and Physical Interval Note:  04/20/2019 7:12 AM  Kayla Lindsey  has presented today for surgery, with the diagnosis of OA RIGHT SHOULDER.  The various methods of treatment have been discussed with the patient and family. After consideration of risks, benefits and other options for treatment, the patient has consented to  Procedure(s): TOTAL SHOULDER ARTHROPLASTY (Right) as a surgical intervention.  The patient's history has been reviewed, patient examined, no change in status, stable for surgery.  I have reviewed the patient's chart and labs.  Questions were answered to the patient's satisfaction.     Hiram Gash

## 2019-04-20 NOTE — Discharge Instructions (Signed)
Post Anesthesia Home Care Instructions  Activity: Get plenty of rest for the remainder of the day. A responsible individual must stay with you for 24 hours following the procedure.  For the next 24 hours, DO NOT: -Drive a car -Paediatric nurse -Drink alcoholic beverages -Take any medication unless instructed by your physician -Make any legal decisions or sign important papers.  Meals: Start with liquid foods such as gelatin or soup. Progress to regular foods as tolerated. Avoid greasy, spicy, heavy foods. If nausea and/or vomiting occur, drink only clear liquids until the nausea and/or vomiting subsides. Call your physician if vomiting continues.  Special Instructions/Symptoms: Your throat may feel dry or sore from the anesthesia or the breathing tube placed in your throat during surgery. If this causes discomfort, gargle with warm salt water. The discomfort should disappear within 24 hours.  If you had a scopolamine patch placed behind your ear for the management of post- operative nausea and/or vomiting:  1. The medication in the patch is effective for 72 hours, after which it should be removed.  Wrap patch in a tissue and discard in the trash. Wash hands thoroughly with soap and water. 2. You may remove the patch earlier than 72 hours if you experience unpleasant side effects which may include dry mouth, dizziness or visual disturbances. 3. Avoid touching the patch. Wash your hands with soap and water after contact with the patch.    Post Anesthesia Home Care Instructions  Activity: Get plenty of rest for the remainder of the day. A responsible individual must stay with you for 24 hours following the procedure.  For the next 24 hours, DO NOT: -Drive a car -Paediatric nurse -Drink alcoholic beverages -Take any medication unless instructed by your physician -Make any legal decisions or sign important papers.  Meals: Start with liquid foods such as gelatin or soup. Progress to  regular foods as tolerated. Avoid greasy, spicy, heavy foods. If nausea and/or vomiting occur, drink only clear liquids until the nausea and/or vomiting subsides. Call your physician if vomiting continues.  Special Instructions/Symptoms: Your throat may feel dry or sore from the anesthesia or the breathing tube placed in your throat during surgery. If this causes discomfort, gargle with warm salt water. The discomfort should disappear within 24 hours.  If you had a scopolamine patch placed behind your ear for the management of post- operative nausea and/or vomiting:  1. The medication in the patch is effective for 72 hours, after which it should be removed.  Wrap patch in a tissue and discard in the trash. Wash hands thoroughly with soap and water. 2. You may remove the patch earlier than 72 hours if you experience unpleasant side effects which may include dry mouth, dizziness or visual disturbances. 3. Avoid touching the patch. Wash your hands with soap and water after contact with the patch.      Regional Anesthesia Blocks  1. Numbness or the inability to move the "blocked" extremity may last from 3-48 hours after placement. The length of time depends on the medication injected and your individual response to the medication. If the numbness is not going away after 48 hours, call your surgeon.  2. The extremity that is blocked will need to be protected until the numbness is gone and the  Strength has returned. Because you cannot feel it, you will need to take extra care to avoid injury. Because it may be weak, you may have difficulty moving it or using it. You may not know what  position it is in without looking at it while the block is in effect.  3. For blocks in the legs and feet, returning to weight bearing and walking needs to be done carefully. You will need to wait until the numbness is entirely gone and the strength has returned. You should be able to move your leg and foot normally before  you try and bear weight or walk. You will need someone to be with you when you first try to ensure you do not fall and possibly risk injury.  4. Bruising and tenderness at the needle site are common side effects and will resolve in a few days.  5. Persistent numbness or new problems with movement should be communicated to the surgeon or the [facility] [phone] [place] [phone]).    Information for Discharge Teaching: EXPAREL (bupivacaine liposome injectable suspension)   Your surgeon or anesthesiologist gave you EXPAREL(bupivacaine) to help control your pain after surgery.   EXPAREL is a local anesthetic that provides pain relief by numbing the tissue around the surgical site.  EXPAREL is designed to release pain medication over time and can control pain for up to 72 hours.  Depending on how you respond to EXPAREL, you may require less pain medication during your recovery.  Possible side effects:  Temporary loss of sensation or ability to move in the area where bupivacaine was injected.  Nausea, vomiting, constipation  Rarely, numbness and tingling in your mouth or lips, lightheadedness, or anxiety may occur.  Call your doctor right away if you think you may be experiencing any of these sensations, or if you have other questions regarding possible side effects.  Follow all other discharge instructions given to you by your surgeon or nurse. Eat a healthy diet and drink plenty of water or other fluids.  If you return to the hospital for any reason within 96 hours following the administration of EXPAREL, it is important for health care providers to know that you have received this anesthetic. A teal colored band has been placed on your arm with the date, time and amount of EXPAREL you have received in order to alert and inform your health care providers. Please leave this armband in place for the full 96 hours following administration, and then  you may remove the band.   Post Anesthesia Home Care Instructions  Activity: Get plenty of rest for the remainder of the day. A responsible individual must stay with you for 24 hours following the procedure.  For the next 24 hours, DO NOT: -Drive a car -Paediatric nurse -Drink alcoholic beverages -Take any medication unless instructed by your physician -Make any legal decisions or sign important papers.  Meals: Start with liquid foods such as gelatin or soup. Progress to regular foods as tolerated. Avoid greasy, spicy, heavy foods. If nausea and/or vomiting occur, drink only clear liquids until the nausea and/or vomiting subsides. Call your physician if vomiting continues.  Special Instructions/Symptoms: Your throat may feel dry or sore from the anesthesia or the breathing tube placed in your throat during surgery. If this causes discomfort, gargle with warm salt water. The discomfort should disappear within 24 hours.  If you had a scopolamine patch placed behind your ear for the management of post- operative nausea and/or vomiting:  1. The medication in the patch is effective for 72 hours, after which it should be removed.  Wrap patch in a tissue and discard in the trash. Wash hands thoroughly with soap and  water. 2. You may remove the patch earlier than 72 hours if you experience unpleasant side effects which may include dry mouth, dizziness or visual disturbances. 3. Avoid touching the patch. Wash your hands with soap and water after contact with the patch.  Regional Anesthesia Blocks  1. Numbness or the inability to move the "blocked" extremity may last from 3-48 hours after placement. The length of time depends on the medication injected and your individual response to the medication. If the numbness is not going away after 48 hours, call your surgeon.  2. The extremity that is blocked will need to be protected until the numbness is gone and the  Strength has returned. Because you  cannot feel it, you will need to take extra care to avoid injury. Because it may be weak, you may have difficulty moving it or using it. You may not know what position it is in without looking at it while the block is in effect.  3. For blocks in the legs and feet, returning to weight bearing and walking needs to be done carefully. You will need to wait until the numbness is entirely gone and the strength has returned. You should be able to move your leg and foot normally before you try and bear weight or walk. You will need someone to be with you when you first try to ensure you do not fall and possibly risk injury.  4. Bruising and tenderness at the needle site are common side effects and will resolve in a few days.  5. Persistent numbness or new problems with movement should be communicated to the surgeon or the [place] [phone] [place] [phone]).  Information for Discharge Teaching: EXPAREL (bupivacaine liposome injectable suspension)   Your surgeon or anesthesiologist gave you EXPAREL(bupivacaine) to help control your pain after surgery.   EXPAREL is a local anesthetic that provides pain relief by numbing the tissue around the surgical site.  EXPAREL is designed to release pain medication over time and can control pain for up to 72 hours.  Depending on how you respond to EXPAREL, you may require less pain medication during your recovery.  Possible side effects:  Temporary loss of sensation or ability to move in the area where bupivacaine was injected.  Nausea, vomiting, constipation  Rarely, numbness and tingling in your mouth or lips, lightheadedness, or anxiety may occur.  Call your doctor right away if you think you may be experiencing any of these sensations, or if you have other questions regarding possible side effects.  Follow all other discharge instructions given to you by your surgeon or nurse. Eat a healthy diet and drink  plenty of water or other fluids.  If you return to the hospital for any reason within 96 hours following the administration of EXPAREL, it is important for health care providers to know that you have received this anesthetic. A teal colored band has been placed on your arm with the date, time and amount of EXPAREL you have received in order to alert and inform your health care providers. Please leave this armband in place for the full 96 hours following administration, and then you may remove the band.

## 2019-04-20 NOTE — Progress Notes (Signed)
Assisted Dr. Rose with right, ultrasound guided, interscalene  block. Side rails up, monitors on throughout procedure. See vital signs in flow sheet. Tolerated Procedure well. 

## 2019-04-20 NOTE — Anesthesia Postprocedure Evaluation (Signed)
Anesthesia Post Note  Patient: MARDELL SCHULDT  Procedure(s) Performed: TOTAL SHOULDER ARTHROPLASTY (Right Shoulder)     Patient location during evaluation: PACU Anesthesia Type: General Level of consciousness: awake and alert Pain management: pain level controlled Vital Signs Assessment: post-procedure vital signs reviewed and stable Respiratory status: spontaneous breathing, nonlabored ventilation, respiratory function stable and patient connected to nasal cannula oxygen Cardiovascular status: blood pressure returned to baseline and stable Postop Assessment: no apparent nausea or vomiting Anesthetic complications: no    Last Vitals:  Vitals:   04/20/19 1030 04/20/19 1045  BP: 129/86 (!) 125/91  Pulse: 91 99  Resp: 16 17  Temp:    SpO2: 98% 98%    Last Pain:  Vitals:   04/20/19 1030  TempSrc:   PainSc: 0-No pain                 Yeray Tomas S

## 2019-04-20 NOTE — Op Note (Signed)
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  Patient's glenoid was extremely small and we had to accept a partially uncontained anterior peg to preserve our superior and posterior pegs.  We did not cement the anterior peg obviously.  We had great fixation at the end of the case of the glenoid.  Patient's bone quality was appropriate for stemless implant and we had a robust subscapularis repair at the end of the case.  Axillary nerve is intact and palpable at the end of the case on the tug test.   Post-operative plan: The patient will be NWB in sling.  The patient will be admitted overnight.  DVT prophylaxis not indicated in isolated upper extremity surgery patient with no specific risks factors.  Pain control with PRN pain medication preferring oral medicines.  Follow up plan will be scheduled in approximately 7 days for incision check and XR.  Physical therapy to start after first visit.  We are treating the patient is a 1 stage with 3-week held cultures at the end of the case.  She will be on doxycycline 100 twice a day until her cultures are resulted.  Implants: Tornier size 1 nucleus simpliciti, 41 head, small 30 glenoid  Post-Op Diagnosis: Same Surgeons:Primary: Hiram Gash, MD Assistants:Caroline McBane PA-C Location: [facility] OR ROOM 6 Anesthesia: General with Exparel Interscalene Antibiotics: Ancef 2g preop, Vancomycin 1000mg  locally Tourniquet time: None Estimated Blood Loss: [id number] Complications: None Specimens: 3 for culture, hold for 3 weeks to rule out C.acnes Implants: Implant Name Type Inv. Item Serial No. Manufacturer Lot No. LRB No. Used Action  CEMENT BONE DEPUY - [id number] Cement CEMENT BONE DEPUY  DEPUY  SYNTHES [id number] Right 1 Implanted  cortiloc pegged glenoid S30    [employer] [id number] Right 1 Implanted  humeral head 53mmx15mm     [id number] Right 1 Implanted  nucleus, size 1    [employer] [id number] Right 1 Implanted    Indications for Surgery:   Kayla Lindsey is a 54 y.o. female with end-stage arthritis and the setting of rheumatoid arthritis of her right shoulder refractory to multiple arthroscopic surgical management options.  Benefits and risks of operative and nonoperative management were discussed prior to surgery with patient/guardian(s) and informed consent form was completed.  Infection and need for further surgery were discussed as was prosthetic stability and cuff issues.  We additionally specifically discussed risks of axillary nerve injury, infection, periprosthetic fracture, continued pain and longevity of implants prior to beginning procedure.  Specifically longevity of implants was concerned at her age however she elected to proceed.  She additionally has had multiple arthroscopic surgeries and we talked about treating her as a 1 stage with cultures to be obtained at the time of surgery.    Procedure:   The patient was identified in the preoperative holding area where the surgical site was marked. Block placed by anesthesia with exparel.  The patient was taken to the OR where a procedural timeout was called and the above noted anesthesia was induced.  The patient was positioned beachchair on allen table with spider arm positioner.  Preoperative antibiotics were dosed.  The patient's right shoulder was prepped and draped in the usual sterile fashion.  A second preoperative timeout was called.       Standard deltopectoral  approach was performed with a #10 blade. We dissected down to the subcutaneous tissues and the cephalic vein was taken laterally with the deltoid. Clavipectoral fascia was incised in line with the incision. Deep retractors were placed. The long of the  biceps tendon was identified and there was significant tenosynovitis present.  Tenodesis was performed to the pectoralis tendon with #2 Ethibond. The remaining biceps was followed up into the rotator interval where it was released. The subscapularis was taken down with a lesser tuberosity osteotomy using an osteotome. The osteotomy fragment and underlying capsular elevated off of the humeral neck and the osteophytes inferiorly. #2 Ethibond sutures are passed through the bone tendon junction for subscap manipulation. We continued releasing the capsule directly off of the osteophytes inferiorly all the way around the corner. This allowed [place] to dislocate the humeral head. The humeral head had evidence of severe osteoarthritic wear with full-thickness cartilage loss and exposed subchondral bone. There was significant flattening of the humeral head.   The rotator cuff was carefully examined and noted to be intact without sign of wear.  The decision was confirmed that an anatomic total shoulder was indicated for this patient.  There were osteophytes along the inferior humeral neck. The osteophytes were removed with an osteotome and a rongeur.  Osteophytes were removed with a rongeur and an osteotome and the anatomic neck was well visualized.   We next made our humeral osteotomy with an oscillating saw along the anatomic neck. The head fragment was passed off the back table and measured approximately 42-43 in diameter.  Bone quality was reasonable and we decided that it was appropriate to use simpliciti stemless implants and placed a guidepin perpendicular to our cut using a guide.  This obtained bicortical purchase.  We then reamed off of this to a flat surface and drilled and placed our nucleus.  A cut protector was placed.   The subscapularis was again identified and immediately we took care to palpate the axillary nerve anteriorly and verify its position with gentle palpation as well as the tug test.  We then  released the SGHL with bovie cautery prior to placing a curved mayo at the junction of the anterior glenoid well above the axillary nerve and bluntly dissecting the subscapularis from the capsule.  We then carefully protected the axillary nerve as we gently released the inferior capsule to fully mobilize the subscapularis.  An anterior deltoid retractor was then placed as well as a small Hohmann retractor superiorly.    The glenoid was inspected and had evidence of severe osteoarthritic wear with full-thickness cartilage loss and exposed subchondral bone. The remaining labrum was removed circumferentially taking great care not to disrupt the posterior capsule. The glenoid was sized as listed in the implants above. The center hole was marked and we used a glenoid drill guide to place a guide pin in the center position.  The glenoid was reamed concentrically over the guide pin. Next the center hole was enlarged and the drill guide for the peripheral pegs was placed. The vault was intact. The peripheral pegs were drilled in standard fashion.  We did have to preferentially find the we could on seat one of our 3 pegs due to the size of her glenoid.  We felt that drilling a partially contained anterior peg was our preference to removing the PEG completely from the final implant or alternatively having a partially contained posterior PEG.  A trial glenoid was placed and was stable. We removed the trial  components.   The glenoid bone was prepared with pulsatile lavage and a sponge to dry the bone prior to cement applicatio. Cement was mixed on the back table the peripheral peg holes were cemented. The glenoid was impacted securely.   We turned attention back to the humeral side. The cut protector was removed. We trialed with multiple size head options and selected a 41 which re-created the patient's anatomy. The offset was dialed in to match the normal anatomy. The shoulder was trialed.  There was good ROM in all  planes and the shoulder was stable with approximately 50% posterior spring back and no inferior translation.  The real humeral implants were opened on the back table and assembled.  The trial was removed. #5 Fiberwire sutures passed through the humeral neck for subscap repair. The humeral component was press-fit obtaining a secure fit.  The joint was reduced and thoroughly irrigated with pulsatile lavage. Subscap and lesser tuberosity osteotomy repaired back in a double row fashion with #5 Fiberwire sutures through bone tunnels. Next the rotator interval was closed with #2 ethibond suture. Hemostasis was obtained. The deltopectoral interval was reapproximated with #1 Ethibond. The subcutaneous tissues were closed with 2-0 Vicryl and the skin was closed with a running monocryl. The incisions were cleaned and dried and an Aquacel dressing was placed. The drapes taken down. The arm was placed into sling with abduction pillow. Patient was awakened, extubated, and transferred to the recovery room in stable condition. There were no intraoperative complications. The sponge, needle, and attention counts were correct at the end of the case.    Kayla Chapel, PA-C, present and scrubbed throughout the case, critical for completion in a timely fashion, and for retraction, instrumentation, closure.

## 2019-04-21 ENCOUNTER — Encounter: Payer: Self-pay | Admitting: *Deleted

## 2019-04-25 LAB — AEROBIC/ANAEROBIC CULTURE W GRAM STAIN (SURGICAL/DEEP WOUND): Culture: NO GROWTH

## 2019-05-04 ENCOUNTER — Ambulatory Visit (INDEPENDENT_AMBULATORY_CARE_PROVIDER_SITE_OTHER): Payer: Managed Care, Other (non HMO) | Admitting: Physical Therapy

## 2019-05-04 ENCOUNTER — Encounter: Payer: Self-pay | Admitting: Physical Therapy

## 2019-05-04 ENCOUNTER — Other Ambulatory Visit: Payer: Self-pay

## 2019-05-04 DIAGNOSIS — R293 Abnormal posture: Secondary | ICD-10-CM | POA: Diagnosis not present

## 2019-05-04 DIAGNOSIS — M25511 Pain in right shoulder: Secondary | ICD-10-CM | POA: Diagnosis not present

## 2019-05-04 DIAGNOSIS — M25611 Stiffness of right shoulder, not elsewhere classified: Secondary | ICD-10-CM | POA: Diagnosis not present

## 2019-05-04 DIAGNOSIS — R29898 Other symptoms and signs involving the musculoskeletal system: Secondary | ICD-10-CM | POA: Diagnosis not present

## 2019-05-04 NOTE — Patient Instructions (Signed)
Access Code: [email or web address]  URL: [email or web address]  Date: 05/04/2019  Prepared by: Jeral Pinch   Exercises Standing Scapular Retraction - 10 reps - 5-6x daily Standing Backward Shoulder Rolls - 10 reps - 5-6x daily Circular Shoulder Pendulum with Table Support - 10 reps - 5-6x daily Standing 'L' Stretch at Counter - 10 reps - 5-6x daily

## 2019-05-04 NOTE — Therapy (Signed)
[place], [place], [address] Phone: [phone]   Fax:  [phone]  Physical Therapy Evaluation  Patient Details  Name: Kayla Lindsey MRN: [id number] Date of Birth: 1966-02-02 Referring Provider (PT): Dr Ophelia Charter   Encounter Date: 05/04/2019  PT End of Session - 05/04/19 0756    Visit Number  1    Number of Visits  16    Date for PT Re-Evaluation  06/29/19    Authorization Type  CIGNA    PT Start Time  0758    PT Stop Time  0848    PT Time Calculation (min)  50 min    Activity Tolerance  Patient tolerated treatment well    Behavior During Therapy  [facility] for tasks assessed/performed       Past Medical History:  Diagnosis Date  . Anxiety   . Depression   . Hashimoto's thyroiditis    hypo  . Hypothyroidism   . Mastocytosis   . Osteoarthritis    right shoulder  . Rheumatoid arthritis ([place])    mgd by Dr Jillyn Ledger, Maralyn Sago   . Vitiligo     Past Surgical History:  Procedure Laterality Date  . APPENDECTOMY    . BREAST BIOPSY Bilateral   . BREAST LUMPECTOMY Right   . carpel tunnel Bilateral   . CHOLECYSTECTOMY    . HIP SURGERY Left 02/2007   laboral repair   . SHOULDER ACROMIOPLASTY Right   . TOTAL SHOULDER ARTHROPLASTY Right 04/20/2019   Procedure: TOTAL SHOULDER ARTHROPLASTY;  Surgeon: Hiram Gash, MD;  Location: [place];  Service: Orthopedics;  Laterality: Right;    There were no vitals filed for this visit.   Subjective Assessment - 05/04/19 0759    Subjective  Pt reports she had elective Rt shoulder replacement 04/20/2019, she is back to work - Financial controller.  Starting to have cramping in the anterior shoulder at night with trying to sleep.    Patient Stated Goals  use Rt arm again    Currently in Pain?  Yes    Pain Score  5     Pain Location  Shoulder    Pain Orientation  Right    Pain Descriptors / Indicators  Aching;Spasm    Pain Type   Surgical pain    Pain Onset  More than a month ago    Pain Frequency  Constant    Aggravating Factors   any movement    Pain Relieving Factors  meds and ice         [facility] PT Assessment - 05/04/19 0001      Assessment   Medical Diagnosis  Rt total shoulder    Referring Provider (PT)  Dr Ophelia Charter    Onset Date/Surgical Date  04/20/19    Hand Dominance  Right    Next MD Visit  05/19/2019    Prior Therapy  yes      Precautions   Precautions  Shoulder    Shoulder Interventions  Don joy ultra sling      Balance Screen   Has the patient fallen in the past 6 months  No    Has the patient had a decrease in activity level because of a fear of falling?   No    Is the patient reluctant to leave their home because of a fear of falling?   No      Home Film/video editor residence  Living Arrangements  Spouse/significant other      Prior Function   Level of Independence  --   assist with bathing and dressing   Vocation  Full time employment    Vocation Requirements  sedentary work - desk    Leisure  walk and care for animals      Observation/Other Assessments   Focus on Therapeutic Outcomes (FOTO)   70% limited      Posture/Postural Control   Posture/Postural Control  Postural limitations    Postural Limitations  Rounded Shoulders;Forward head      ROM / Strength   AROM / PROM / Strength  PROM;AROM      AROM   AROM Assessment Site  Shoulder;Elbow    Right/Left Shoulder  --   Lt WNL   Right/Left Elbow  --   WNL     PROM   PROM Assessment Site  Shoulder    Right/Left Shoulder  Right    Right Shoulder Extension  --   NA   Right Shoulder ABduction  70 Degrees    Right Shoulder Internal Rotation  30 Degrees    Right Shoulder External Rotation  28 Degrees      Palpation   Palpation comment  tight in Rt deltoid                Objective measurements completed on examination: See above findings.      [facility] PT  Treatment/Exercise - 05/04/19 0001      Exercises   Exercises  Shoulder      Shoulder Exercises: Seated   Other Seated Exercises  scapular retractions BWD shoulder rolls      Shoulder Exercises: Standing   Other Standing Exercises  penulums and passive flexion with hands on counter       Modalities   Modalities  Vasopneumatic      Vasopneumatic   Number Minutes Vasopneumatic   15 minutes    Vasopnuematic Location   Shoulder    Vasopneumatic Pressure  Low    Vasopneumatic Temperature   3*      Manual Therapy   Manual Therapy  Passive ROM    Passive ROM  Rt shoulder flex and rotation in supine with arm supported on pillow             PT Education - 05/04/19 0837    Education Details  HEP POC, self triggerpoint release with ball on wall for deltoid    Person(s) Educated  Patient    Methods  Explanation;Demonstration;Handout    Comprehension  Returned demonstration;Verbalized understanding       PT Short Term Goals - 05/04/19 0842      PT SHORT TERM GOAL #1   Title  I with initial HEP (06/01/2019)    Time  4    Status  New    Target Date  06/01/19      PT SHORT TERM GOAL #2   Title  demo Rt shoulder PROM WFL ( 06/01/2019)    Time  4    Period  Weeks    Status  New    Target Date  06/01/19      PT SHORT TERM GOAL #3   Title  assess strength ( 06/01/2019)    Time  4    Period  Weeks    Status  New    Target Date  06/01/19      PT SHORT TERM GOAL #4   Title  I with bathing and  dressing ( 06/01/2019)    Time  4    Period  Weeks    Status  New    Target Date  06/01/19        PT Long Term Goals - 05/04/19 0844      PT LONG TERM GOAL #1   Title  Improve posture and alignment with patient to demonstrate improved upright posture with posterior shoulder girdle engaged ( 06/29/2019)    Time  8    Period  Weeks    Status  New    Target Date  06/29/19      PT LONG TERM GOAL #2   Title  Increased AROM Rt shoulder to =/> AROM Lt shoulder with minimal to no  pain  ( 06/29/2019)    Time  8    Period  Weeks    Status  New    Target Date  06/29/19      PT LONG TERM GOAL #3   Title  dmeo Rt shoulder strength =/> 4+/5 to allow return to normal activity ( 06/29/2019)    Time  8    Period  Weeks    Status  New    Target Date  06/01/19      PT LONG TERM GOAL #4   Title  Independent in HEP ( 06/29/2019)    Time  8    Period  Weeks    Status  New    Target Date  06/29/19      PT LONG TERM GOAL #5   Title  Improve FOTO to </= 44% limited ( 06/29/2019)    Time  8    Period  Weeks    Status  New    Target Date  06/29/19             Plan - 05/04/19 [id number]    Clinical Impression Statement  54 yo female ~ 2 wks s/t Rt total shoulder replacement.  She has had multiple shoulder surgeries and failed conservative tx after these.  She is wearing her splint.  Returned to work - Cytogeneticist .  She is to follow protocol for shoulder replacement with initial focus on PROM, scapular stability and pain control.    Personal Factors and Comorbidities  Comorbidity 2    Examination-Activity Limitations  Bathing;Bed Mobility;Self Feeding;Sleep;Dressing;Hygiene/Grooming;Toileting    Examination-Participation Restrictions  Meal Prep;Community Activity;Cleaning;Other;Laundry    Stability/Clinical Decision Making  Stable/Uncomplicated    Clinical Decision Making  Low    Rehab Potential  Excellent    PT Frequency  2x / week    PT Duration  8 weeks    PT Treatment/Interventions  Iontophoresis 4mg /ml Dexamethasone;Taping;Vasopneumatic Device;Patient/family education;Moist Heat;Ultrasound;Scar mobilization;Passive range of motion;Therapeutic exercise;Cryotherapy;Electrical Stimulation;Manual techniques;Dry needling    PT Next Visit Plan  PROM Rt shoulder, initial scapular work, avoid shoulder extension, modalities for pain.    Consulted and Agree with Plan of Care  Patient       Patient will benefit from skilled therapeutic intervention in order to improve the  following deficits and impairments:  Decreased range of motion, Impaired UE functional use, Increased muscle spasms, Pain, Increased edema, Decreased strength  Visit Diagnosis: Acute pain of right shoulder - Plan: PT plan of care cert/re-cert  Other symptoms and signs involving the musculoskeletal system - Plan: PT plan of care cert/re-cert  Abnormal posture - Plan: PT plan of care cert/re-cert  Stiffness of right shoulder, not elsewhere classified - Plan: PT plan of care cert/re-cert     Problem  List Patient Active Problem List   Diagnosis Date Noted  . De Quervain's disease (tenosynovitis) 11/10/2018  . Shoulder pain, right 06/23/2018  . Lesion of mouth 12/23/2017  . Esophageal dysphagia 12/23/2017  . Mastocytosis 12/23/2017  . Vitiligo 12/23/2017  . Rosacea 12/23/2017  . Hypothyroidism 12/23/2017  . Anxiety and depression 12/23/2017    Jeral Pinch PT  05/04/2019, 8:52 AM  [facility] [place], [place], [address] Phone: [phone]   Fax:  [phone]  Name: Kayla Lindsey MRN: [id number] Date of Birth: 05-20-1965

## 2019-05-10 ENCOUNTER — Other Ambulatory Visit: Payer: Self-pay

## 2019-05-10 ENCOUNTER — Encounter: Payer: Self-pay | Admitting: Physical Therapy

## 2019-05-10 ENCOUNTER — Ambulatory Visit (INDEPENDENT_AMBULATORY_CARE_PROVIDER_SITE_OTHER): Payer: Managed Care, Other (non HMO) | Admitting: Physical Therapy

## 2019-05-10 DIAGNOSIS — R293 Abnormal posture: Secondary | ICD-10-CM

## 2019-05-10 DIAGNOSIS — M25511 Pain in right shoulder: Secondary | ICD-10-CM

## 2019-05-10 DIAGNOSIS — M25611 Stiffness of right shoulder, not elsewhere classified: Secondary | ICD-10-CM

## 2019-05-10 DIAGNOSIS — R29898 Other symptoms and signs involving the musculoskeletal system: Secondary | ICD-10-CM

## 2019-05-10 NOTE — Therapy (Signed)
[place], [place], [address] Phone: [phone]   Fax:  [phone]  Physical Therapy Treatment  Patient Details  Name: Kayla Lindsey MRN: [id number] Date of Birth: [birth year] Referring Provider (PT): Dr Ophelia Charter   Encounter Date: 05/10/2019  PT End of Session - 05/10/19 0925    Visit Number  2    Number of Visits  16    Date for PT Re-Evaluation  06/29/19    Authorization Type  CIGNA    PT Start Time  0848    PT Stop Time  0937    PT Time Calculation (min)  49 min    Activity Tolerance  Patient tolerated treatment well    Behavior During Therapy  [facility] for tasks assessed/performed       Past Medical History:  Diagnosis Date  . Anxiety   . Depression   . Hashimoto's thyroiditis    hypo  . Hypothyroidism   . Mastocytosis   . Osteoarthritis    right shoulder  . Rheumatoid arthritis ([place])    mgd by Dr Jillyn Ledger, Maralyn Sago   . Vitiligo     Past Surgical History:  Procedure Laterality Date  . APPENDECTOMY    . BREAST BIOPSY Bilateral   . BREAST LUMPECTOMY Right   . carpel tunnel Bilateral   . CHOLECYSTECTOMY    . HIP SURGERY Left 02/2007   laboral repair   . SHOULDER ACROMIOPLASTY Right   . TOTAL SHOULDER ARTHROPLASTY Right 04/20/2019   Procedure: TOTAL SHOULDER ARTHROPLASTY;  Surgeon: Hiram Gash, MD;  Location: [place];  Service: Orthopedics;  Laterality: Right;    There were no vitals filed for this visit.  Subjective Assessment - 05/10/19 0850    Subjective  Pt reports soreness in her Rt shoulder. Doing well with HEP - getting better at them    Patient Stated Goals  use Rt arm again    Currently in Pain?  Yes    Pain Score  5     Pain Location  Shoulder    Pain Orientation  Right    Pain Descriptors / Indicators  Aching;Spasm    Pain Type  Surgical pain    Pain Onset  More than a month ago    Pain Frequency  Constant         [facility] PT  Assessment - 05/10/19 0001      PROM   Right Shoulder Flexion  135 Degrees    Right Shoulder ABduction  90 Degrees    Right Shoulder Internal Rotation  60 Degrees    Right Shoulder External Rotation  44 Degrees                   [facility] Adult PT Treatment/Exercise - 05/10/19 0001      Shoulder Exercises: Standing   External Rotation  PROM;Right;5 reps   10 sec hold, elbow at side using door frame   Retraction  Strengthening;Both;5 reps   10 sec hold - squeezing noodle - keeping arm infront   Retraction Limitations  then scapular depression.       Bicep curls in supine arm supported 2#   Number Minutes Vasopneumatic   15 minutes    Vasopnuematic Location   Shoulder    Vasopneumatic Pressure  Medium    Vasopneumatic Temperature   3*      Manual Therapy   Manual Therapy  Passive ROM;Soft tissue mobilization;Manual Traction    Soft tissue  mobilization  STM to Rt pecs and deltoid in supine    Passive ROM  Rt shoulder flex, abdcution to 90 and rotation in supine with arm supported on pillow    Manual Traction  Rt arm distraction               PT Short Term Goals - 05/04/19 0842      PT SHORT TERM GOAL #1   Title  I with initial HEP (06/01/2019)    Time  4    Status  New    Target Date  06/01/19      PT SHORT TERM GOAL #2   Title  demo Rt shoulder PROM WFL ( 06/01/2019)    Time  4    Period  Weeks    Status  New    Target Date  06/01/19      PT SHORT TERM GOAL #3   Title  assess strength ( 06/01/2019)    Time  4    Period  Weeks    Status  New    Target Date  06/01/19      PT SHORT TERM GOAL #4   Title  I with bathing and dressing ( 06/01/2019)    Time  4    Period  Weeks    Status  New    Target Date  06/01/19        PT Long Term Goals - 05/04/19 0844      PT LONG TERM GOAL #1   Title  Improve posture and alignment with patient to demonstrate improved upright posture with posterior shoulder girdle engaged ( 06/29/2019)    Time  8    Period   Weeks    Status  New    Target Date  06/29/19      PT LONG TERM GOAL #2   Title  Increased AROM Rt shoulder to =/> AROM Lt shoulder with minimal to no pain  ( 06/29/2019)    Time  8    Period  Weeks    Status  New    Target Date  06/29/19      PT LONG TERM GOAL #3   Title  dmeo Rt shoulder strength =/> 4+/5 to allow return to normal activity ( 06/29/2019)    Time  8    Period  Weeks    Status  New    Target Date  06/01/19      PT LONG TERM GOAL #4   Title  Independent in HEP ( 06/29/2019)    Time  8    Period  Weeks    Status  New    Target Date  06/29/19      PT LONG TERM GOAL #5   Title  Improve FOTO to </= 44% limited ( 06/29/2019)    Time  8    Period  Weeks    Status  New    Target Date  06/29/19            Plan - 05/10/19 0926    Clinical Impression Statement  Kayla Lindsey has improved PROM of Rt shoulder, tightness in the pecs and deltoid that released some with STM.  On track with protocol, Sees MD next FRiday    Rehab Potential  Excellent    PT Frequency  2x / week    PT Duration  8 weeks    PT Treatment/Interventions  Iontophoresis 4mg /ml Dexamethasone;Taping;Vasopneumatic Device;Patient/family education;Moist Heat;Ultrasound;Scar mobilization;Passive range of motion;Therapeutic exercise;Cryotherapy;Electrical Stimulation;Manual techniques;Dry needling  PT Next Visit Plan  PROM Rt shoulder, initial scapular work, avoid shoulder extension, modalities for pain.       Patient will benefit from skilled therapeutic intervention in order to improve the following deficits and impairments:  Decreased range of motion, Impaired UE functional use, Increased muscle spasms, Pain, Increased edema, Decreased strength  Visit Diagnosis: Acute pain of right shoulder  Other symptoms and signs involving the musculoskeletal system  Abnormal posture  Stiffness of right shoulder, not elsewhere classified     Problem List Patient Active Problem List   Diagnosis Date Noted  .  De Quervain's disease (tenosynovitis) 11/10/2018  . Shoulder pain, right 06/23/2018  . Lesion of mouth 12/23/2017  . Esophageal dysphagia 12/23/2017  . Mastocytosis 12/23/2017  . Vitiligo 12/23/2017  . Rosacea 12/23/2017  . Hypothyroidism 12/23/2017  . Anxiety and depression 12/23/2017    Jeral Pinch PT  05/10/2019, 9:28 AM  [facility] [place], [place], [address] Phone: [phone]   Fax:  [phone]  Name: Kayla Lindsey MRN: [id number] Date of Birth: May 18, 1965

## 2019-05-11 LAB — AEROBIC/ANAEROBIC CULTURE W GRAM STAIN (SURGICAL/DEEP WOUND)
Culture: NO GROWTH
Gram Stain: NONE SEEN

## 2019-05-12 ENCOUNTER — Encounter: Payer: Self-pay | Admitting: Family

## 2019-05-12 ENCOUNTER — Ambulatory Visit: Payer: Managed Care, Other (non HMO) | Admitting: Family

## 2019-05-12 ENCOUNTER — Ambulatory Visit (INDEPENDENT_AMBULATORY_CARE_PROVIDER_SITE_OTHER): Payer: Managed Care, Other (non HMO) | Admitting: Physical Therapy

## 2019-05-12 ENCOUNTER — Other Ambulatory Visit: Payer: Self-pay

## 2019-05-12 ENCOUNTER — Encounter: Payer: Self-pay | Admitting: Physical Therapy

## 2019-05-12 ENCOUNTER — Other Ambulatory Visit (HOSPITAL_COMMUNITY): Payer: Self-pay | Admitting: Family

## 2019-05-12 DIAGNOSIS — R293 Abnormal posture: Secondary | ICD-10-CM | POA: Diagnosis not present

## 2019-05-12 DIAGNOSIS — M25511 Pain in right shoulder: Secondary | ICD-10-CM | POA: Diagnosis not present

## 2019-05-12 DIAGNOSIS — R29898 Other symptoms and signs involving the musculoskeletal system: Secondary | ICD-10-CM | POA: Diagnosis not present

## 2019-05-12 DIAGNOSIS — Z96619 Presence of unspecified artificial shoulder joint: Secondary | ICD-10-CM | POA: Diagnosis not present

## 2019-05-12 DIAGNOSIS — M009 Pyogenic arthritis, unspecified: Secondary | ICD-10-CM

## 2019-05-12 DIAGNOSIS — T8459XA Infection and inflammatory reaction due to other internal joint prosthesis, initial encounter: Secondary | ICD-10-CM | POA: Diagnosis not present

## 2019-05-12 DIAGNOSIS — M25611 Stiffness of right shoulder, not elsewhere classified: Secondary | ICD-10-CM | POA: Diagnosis not present

## 2019-05-12 NOTE — Therapy (Signed)
[place], [place], [address] Phone: [phone]   Fax:  [phone]  Physical Therapy Treatment  Patient Details  Name: Kayla Lindsey MRN: [id number] Date of Birth: [birth year] Referring Provider (PT): Dr Ophelia Charter   Encounter Date: 05/12/2019  PT End of Session - 05/12/19 0924    Visit Number  3    Number of Visits  16    Date for PT Re-Evaluation  06/29/19    Authorization Type  CIGNA    PT Start Time  0845    PT Stop Time  0938    PT Time Calculation (min)  53 min    Activity Tolerance  Patient tolerated treatment well    Behavior During Therapy  [facility] for tasks assessed/performed       Past Medical History:  Diagnosis Date  . Anxiety   . Depression   . Hashimoto's thyroiditis    hypo  . Hypothyroidism   . Mastocytosis   . Osteoarthritis    right shoulder  . Rheumatoid arthritis ([place])    mgd by Dr Jillyn Ledger, Maralyn Sago   . Vitiligo     Past Surgical History:  Procedure Laterality Date  . APPENDECTOMY    . BREAST BIOPSY Bilateral   . BREAST LUMPECTOMY Right   . carpel tunnel Bilateral   . CHOLECYSTECTOMY    . HIP SURGERY Left 02/2007   laboral repair   . SHOULDER ACROMIOPLASTY Right   . TOTAL SHOULDER ARTHROPLASTY Right 04/20/2019   Procedure: TOTAL SHOULDER ARTHROPLASTY;  Surgeon: Hiram Gash, MD;  Location: [place];  Service: Orthopedics;  Laterality: Right;    There were no vitals filed for this visit.  Subjective Assessment - 05/12/19 0846    Subjective  Pt reports her cultures from surgery came back and there is a bacteria in it.  She has to see an infection disease specialist this morning for treatment. It is a bad one  b/c it eats away at the bone - she was high risk d/t multiple surgeries.    Patient Stated Goals  use Rt arm again    Currently in Pain?  Yes    Pain Score  6     Pain Location  Shoulder    Pain Orientation  Right   in axilla    Pain Descriptors / Indicators  Sore;Spasm    Pain Type  Surgical pain                       [facility] Adult PT Treatment/Exercise - 05/12/19 0001      Shoulder Exercises: Seated   Other Seated Exercises  2x10 bicep curls 2#, arm at side, triceps with red band.       Shoulder Exercises: Standing   Retraction  Strengthening;5 reps;Both   10sec hold     Shoulder Exercises: Pulleys   Flexion  2 minutes      Shoulder Exercises: Isometric Strengthening   Flexion  --   10x5sec   Flexion Limitations  elbow bent to 90 arm at side.     Extension  --   elbow at 90, arm by side10x5sec   External Rotation  --   10x5sec arm at side   Internal Rotation  --   3x5sec, elbow at side   Internal Rotation Limitations  some pain in pecs and triceps - stopped       Modalities   Modalities  Vasopneumatic  Vasopneumatic   Number Minutes Vasopneumatic   15 minutes    Vasopnuematic Location   Shoulder    Vasopneumatic Pressure  Medium    Vasopneumatic Temperature   3*               PT Short Term Goals - 05/04/19 0842      PT SHORT TERM GOAL #1   Title  I with initial HEP (06/01/2019)    Time  4    Status  New    Target Date  06/01/19      PT SHORT TERM GOAL #2   Title  demo Rt shoulder PROM WFL ( 06/01/2019)    Time  4    Period  Weeks    Status  New    Target Date  06/01/19      PT SHORT TERM GOAL #3   Title  assess strength ( 06/01/2019)    Time  4    Period  Weeks    Status  New    Target Date  06/01/19      PT SHORT TERM GOAL #4   Title  I with bathing and dressing ( 06/01/2019)    Time  4    Period  Weeks    Status  New    Target Date  06/01/19        PT Long Term Goals - 05/04/19 0844      PT LONG TERM GOAL #1   Title  Improve posture and alignment with patient to demonstrate improved upright posture with posterior shoulder girdle engaged ( 06/29/2019)    Time  8    Period  Weeks    Status  New    Target Date  06/29/19      PT LONG TERM  GOAL #2   Title  Increased AROM Rt shoulder to =/> AROM Lt shoulder with minimal to no pain  ( 06/29/2019)    Time  8    Period  Weeks    Status  New    Target Date  06/29/19      PT LONG TERM GOAL #3   Title  dmeo Rt shoulder strength =/> 4+/5 to allow return to normal activity ( 06/29/2019)    Time  8    Period  Weeks    Status  New    Target Date  06/01/19      PT LONG TERM GOAL #4   Title  Independent in HEP ( 06/29/2019)    Time  8    Period  Weeks    Status  New    Target Date  06/29/19      PT LONG TERM GOAL #5   Title  Improve FOTO to </= 44% limited ( 06/29/2019)    Time  8    Period  Weeks    Status  New    Target Date  06/29/19            Plan - 05/12/19 0926    Clinical Impression Statement  Pt continues to have improved ROM in her Rt shoulder able to tolerate some bicep/tricep work.  She was informed she has a bacterial infection in her shoulder and will see an infection control specialist this morning.    Rehab Potential  Excellent    PT Frequency  2x / week    PT Duration  8 weeks    PT Treatment/Interventions  Iontophoresis 4mg /ml Dexamethasone;Taping;Vasopneumatic Device;Patient/family education;Moist Heat;Ultrasound;Scar mobilization;Passive range of motion;Therapeutic exercise;Cryotherapy;Electrical Stimulation;Manual  techniques;Dry needling    PT Next Visit Plan  PROM Rt shoulder, initial scapular work, avoid shoulder extension, modalities for pain.    Consulted and Agree with Plan of Care  Patient       Patient will benefit from skilled therapeutic intervention in order to improve the following deficits and impairments:  Decreased range of motion, Impaired UE functional use, Increased muscle spasms, Pain, Increased edema, Decreased strength  Visit Diagnosis: Acute pain of right shoulder  Other symptoms and signs involving the musculoskeletal system  Abnormal posture  Stiffness of right shoulder, not elsewhere classified     Problem  List Patient Active Problem List   Diagnosis Date Noted  . De Quervain's disease (tenosynovitis) 11/10/2018  . Shoulder pain, right 06/23/2018  . Lesion of mouth 12/23/2017  . Esophageal dysphagia 12/23/2017  . Mastocytosis 12/23/2017  . Vitiligo 12/23/2017  . Rosacea 12/23/2017  . Hypothyroidism 12/23/2017  . Anxiety and depression 12/23/2017    Jeral Pinch PT 05/12/2019, 9:29 AM  [facility] [place], [place], [address] Phone: [phone]   Fax:  [phone]  Name: DESMONIQUE HELFMAN MRN: [id number] Date of Birth: 1965/04/07

## 2019-05-12 NOTE — Progress Notes (Signed)
Subjective:    Patient ID: Kayla Lindsey, female    DOB: [birth year], 54 y.o.   MRN: [id number]  Chief Complaint  Patient presents with  . New Patient (Initial Visit)    on oral doxycycline;      HPI:  Kayla Lindsey is a 54 y.o. female with previous medical history of vitiligo, mastocytosis, hypothyroidism, and and anxiety/depression who presents today for evaluation and treatment of right shoulder prosthetic joint infection at the request of Dr. Griffin Basil.   Kayla Lindsey initially underwent right shoulder surgery in 2012 with rotator cuff repair and treatment for impingement without complication. Subsequently in 2017 while moving was lifting a box and experienced a popping sensation in her right shoulder. She underwent surgery in [place] for repair in 2018 once again without complication. On 04/07/18 she underwent right shoulder arthroscopic extensive debridement, subacromial decompression and arthroscopic clavicle excision. Unfortunately she developed dengenerative arthritis of the right shoulder with bone-on-bone articulation requiring total shoulder arthroplasty which was completed on 04/19/18. Cultures obtained from surgery were positive for P. Acnes in 1/3 specimens with concern for prosthetic joint infection. She has been started on doxycycline and referred to ID for antibiotic management of prosthetic joint infection.   Kayla Lindsey continues to have right shoulder pain that radiates down to her axilla. Remains in a sling at present. No fevers, chills, or sweats. She is taking the doxycycline as prescribed and has to take it with food as it makes her nauseous on an empty stomach. She has had previous mastocytosis following penicillin administration.    No Known Allergies    Outpatient Medications Prior to Visit  Medication Sig Dispense Refill  . AMBIEN 5 MG tablet Take 5 mg by mouth at bedtime.    . Azelaic Acid 15 % cream Apply 1 application topically 2 (two) times daily.  After skin is thoroughly washed and patted dry 50 g 11  . Biotin 10000 MCG TABS Take by mouth.    Marland Kitchen buPROPion (WELLBUTRIN) 75 MG tablet Take 75 mg by mouth 2 (two) times daily.    . fexofenadine (ALLEGRA) 180 MG tablet Take 180 mg by mouth daily.    . folic acid (FOLVITE) [id number] MCG tablet Take 400 mcg by mouth daily.    . hydroxychloroquine (PLAQUENIL) 200 MG tablet Take by mouth 2 (two) times daily.    . hydrOXYzine (ATARAX/VISTARIL) 10 MG tablet     . levothyroxine (SYNTHROID) 112 MCG tablet Take 1 tablet (112 mcg total) by mouth daily before breakfast. (Patient taking differently: Take 0.125 mcg by mouth daily before breakfast. ) 90 tablet 0  . levothyroxine (SYNTHROID) 125 MCG tablet Take 125 mcg by mouth daily.    . meloxicam (MOBIC) 7.5 MG tablet Take 1 tablet (7.5 mg total) by mouth daily. 30 tablet 2  . methocarbamol (ROBAXIN) 750 MG tablet Take 750 mg by mouth 3 (three) times daily as needed.    . predniSONE (DELTASONE) 5 MG tablet Take 5 mg by mouth daily.    . predniSONE (DELTASONE) 50 MG tablet Take 1 tablet (50 mg total) by mouth daily. (Patient taking differently: Take 5 mg by mouth daily. ) 5 tablet 0  . Sulfacetamide in [place] (SODIUM SULFACETAMIDE WASH) 10 % LIQD Apply 1 application topically 2 (two) times daily as needed. 480 mL 5  . Sulfacetamide Sodium 10 % LIQD APPLY TO FACE TOPICALLY TWICE A DAY AS FACE [place]. 177 mL 1  . traMADol (ULTRAM) 50 MG tablet Take by mouth every  6 (six) hours as needed.    . venlafaxine XR (EFFEXOR-XR) 150 MG 24 hr capsule Take 1 capsule (150 mg total) by mouth daily with breakfast. 90 capsule 1   No facility-administered medications prior to visit.     Past Medical History:  Diagnosis Date  . Anxiety   . Depression   . Hashimoto's thyroiditis    hypo  . Hypothyroidism   . Mastocytosis   . Osteoarthritis    right shoulder  . Rheumatoid arthritis ([place])    mgd by Dr Jillyn Ledger, Maralyn Sago   . Vitiligo      Past Surgical History:    Procedure Laterality Date  . APPENDECTOMY    . BREAST BIOPSY Bilateral   . BREAST LUMPECTOMY Right   . carpel tunnel Bilateral   . CHOLECYSTECTOMY    . HIP SURGERY Left 02/2007   laboral repair   . SHOULDER ACROMIOPLASTY Right   . TOTAL SHOULDER ARTHROPLASTY Right 04/20/2019   Procedure: TOTAL SHOULDER ARTHROPLASTY;  Surgeon: Hiram Gash, MD;  Location: [facility];  Service: Orthopedics;  Laterality: Right;       Review of Systems  Constitutional: Negative for chills, diaphoresis, fatigue and fever.  Respiratory: Negative for cough, chest tightness, shortness of breath and wheezing.   Cardiovascular: Negative for chest pain.  Gastrointestinal: Negative for abdominal pain, diarrhea, nausea and vomiting.  Musculoskeletal:       Positive for right shoulder pain.       Objective:    BP 116/83   Pulse 91   Wt 195 lb (88.5 kg)   BMI 30.54 kg/m  Nursing note and vital signs reviewed.  Physical Exam Constitutional:      General: She is not in acute distress.    Appearance: She is well-developed.     Comments: Seated in the chair; pleasant. Right arm in immobilizer.   Cardiovascular:     Rate and Rhythm: Normal rate and regular rhythm.     Heart sounds: Normal heart sounds.  Pulmonary:     Effort: Pulmonary effort is normal.     Breath sounds: Normal breath sounds.  Skin:    General: Skin is warm and dry.  Neurological:     Mental Status: She is alert and oriented to person, place, and time.  Psychiatric:        Behavior: Behavior normal.        Thought Content: Thought content normal.        Judgment: Judgment normal.      Depression screen [facility] 2/9 05/12/2019 09/13/2018 01/20/2018 12/23/2017  Decreased Interest 0 1 3 3   Down, Depressed, Hopeless 0 2 2 3   PHQ - 2 Score 0 3 5 6   Altered sleeping - 2 2 3   Tired, decreased energy - 3 3 3   Change in appetite - 2 0 0  Feeling bad or failure about yourself  - 2 2 3   Trouble concentrating - 3 3 3    Moving slowly or fidgety/restless - 1 0 2  Suicidal thoughts - 1 0 0  PHQ-9 Score - 17 15 20   Difficult doing work/chores - Somewhat difficult Very difficult Very difficult       Assessment & Plan:    Patient Active Problem List   Diagnosis Date Noted  . Infection of prosthetic shoulder joint ([place]) 05/12/2019  . De Quervain's disease (tenosynovitis) 11/10/2018  . Shoulder pain, right 06/23/2018  . Lesion of mouth 12/23/2017  . Esophageal dysphagia 12/23/2017  . Mastocytosis 12/23/2017  .  Vitiligo 12/23/2017  . Rosacea 12/23/2017  . Hypothyroidism 12/23/2017  . Anxiety and depression 12/23/2017     Problem List Items Addressed This Visit      Musculoskeletal and Integument   Infection of prosthetic shoulder joint [facility])    Ms. 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 Currently without systemic symptoms and tolerating oral doxycycline. Discussed the pathogenesis and treatment options with the recommendation for IV therapy with ceftriaxone for 6 weeks. End date set for 4/20.  Orders placed for PICC line placement and [place].  Plan for follow up in 1 month or sooner if needed. Continue follow up with Dr. Griffin Basil as planned.       Relevant Orders   IRPICC PLACEMENT LEFT >5 YRS INC IMG GUIDE       I am having [place]. [place] "Kim" maintain her venlafaxine XR, fexofenadine, levothyroxine, Azelaic Acid, Sodium Sulfacetamide Wash, Sulfacetamide Sodium, predniSONE, hydroxychloroquine, buPROPion, folic acid, Biotin, meloxicam, traMADol, hydrOXYzine, methocarbamol, levothyroxine, Ambien, and predniSONE.    Follow-up: Return in about 1 month (around 06/12/2019), or if symptoms worsen or fail to improve.   Terri Piedra, MSN, FNP-C Nurse Practitioner [facility] [place] number: [phone]

## 2019-05-12 NOTE — Patient Instructions (Signed)
Nice to see you.  You are scheduled for PICC line placement at 11:30 at [facility] on Tuesday.  We will get you started on ceftriaxone.   Plan for follow up in 1 month.  Have a great day and stay safe!

## 2019-05-12 NOTE — Assessment & Plan Note (Addendum)
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 Currently without systemic symptoms and tolerating oral doxycycline. Discussed the pathogenesis and treatment options with the recommendation for IV therapy with ceftriaxone for 6 weeks. End date set for 4/20.  Orders placed for PICC line placement and [place].  Plan for follow up in 1 month or sooner if needed. Continue follow up with Dr. Griffin Basil as planned.

## 2019-05-15 ENCOUNTER — Other Ambulatory Visit (HOSPITAL_COMMUNITY): Payer: Self-pay | Admitting: *Deleted

## 2019-05-15 ENCOUNTER — Ambulatory Visit (INDEPENDENT_AMBULATORY_CARE_PROVIDER_SITE_OTHER): Payer: Managed Care, Other (non HMO) | Admitting: Physical Therapy

## 2019-05-15 ENCOUNTER — Other Ambulatory Visit: Payer: Self-pay

## 2019-05-15 DIAGNOSIS — M25511 Pain in right shoulder: Secondary | ICD-10-CM

## 2019-05-15 DIAGNOSIS — R29898 Other symptoms and signs involving the musculoskeletal system: Secondary | ICD-10-CM

## 2019-05-15 DIAGNOSIS — R293 Abnormal posture: Secondary | ICD-10-CM | POA: Diagnosis not present

## 2019-05-15 NOTE — Therapy (Signed)
[place], [place], [address] Phone: [phone]   Fax:  [phone]  Physical Therapy Treatment  Patient Details  Name: Kayla Lindsey MRN: [id number] Date of Birth: December 25, 1965 Referring Provider (PT): Dr Ophelia Charter   Encounter Date: 05/15/2019  PT End of Session - 05/15/19 0811    Visit Number  4    Number of Visits  16    Date for PT Re-Evaluation  06/29/19    Authorization Type  [employer]    PT Start Time  0802    PT Stop Time  0840    PT Time Calculation (min)  38 min    Activity Tolerance  Patient tolerated treatment well;No increased pain    Behavior During Therapy  WFL for tasks assessed/performed       Past Medical History:  Diagnosis Date  . Anxiety   . Depression   . Hashimoto's thyroiditis    hypo  . Hypothyroidism   . Mastocytosis   . Osteoarthritis    right shoulder  . Rheumatoid arthritis ([place])    mgd by Dr Jillyn Ledger, Maralyn Sago   . Vitiligo     Past Surgical History:  Procedure Laterality Date  . APPENDECTOMY    . BREAST BIOPSY Bilateral   . BREAST LUMPECTOMY Right   . carpel tunnel Bilateral   . CHOLECYSTECTOMY    . HIP SURGERY Left 02/2007   laboral repair   . SHOULDER ACROMIOPLASTY Right   . TOTAL SHOULDER ARTHROPLASTY Right 04/20/2019   Procedure: TOTAL SHOULDER ARTHROPLASTY;  Surgeon: Hiram Gash, MD;  Location: [facility];  Service: Orthopedics;  Laterality: Right;    There were no vitals filed for this visit.  Subjective Assessment - 05/15/19 0839    Subjective  Pt reports she was very sore after last session.  She slept without the sling last night, but was still uncomfortable and nervous, so hasn't slept well. She will get a PICC line tomorrow for antibiotics.    Currently in Pain?  Yes    Pain Score  4     Pain Location  Shoulder    Pain Orientation  Right    Pain Descriptors / Indicators  Sore;Burning    Aggravating Factors   ?     Pain Relieving Factors  ice         [facility] PT Assessment - 05/15/19 0001      Assessment   Medical Diagnosis  Rt total shoulder    Referring Provider (PT)  Dr Ophelia Charter    Onset Date/Surgical Date  04/20/19    Hand Dominance  Right    Next MD Visit  05/19/2019    Prior Therapy  yes        [facility] Adult PT Treatment/Exercise - 05/15/19 0001      Shoulder Exercises: Seated   Other Seated Exercises  scap squeeze x 10 sec x 10 reps; shoulder circles x 10 CW/CCW      Shoulder Exercises: Standing   Other Standing Exercises  pendulum with tactile and VC for improved form (circles and forward/backward) x 2 min       Shoulder Exercises: Isometric Strengthening   External Rotation  5X5"    Internal Rotation  5X5"    ABduction  5X5"      Vasopneumatic   Number Minutes Vasopneumatic   10 minutes    Vasopnuematic Location   Shoulder   Rt   Vasopneumatic Pressure  Low  Vasopneumatic Temperature   34 deg       Manual Therapy   Manual Therapy  Soft tissue mobilization;Passive ROM;Scapular mobilization    Soft tissue mobilization  STM to Rt pec, upper trap, rhomboid, infraspinatus, and deltoid     Scapular Mobilization  Rt shoulder in all directions (pt in supine)    Passive ROM  Rt shoulder ER (in supported scaption), flexion, scaption to tissue limits and no pain               PT Short Term Goals - 05/04/19 0842      PT SHORT TERM GOAL #1   Title  I with initial HEP (06/01/2019)    Time  4    Status  New    Target Date  06/01/19      PT SHORT TERM GOAL #2   Title  demo Rt shoulder PROM WFL ( 06/01/2019)    Time  4    Period  Weeks    Status  New    Target Date  06/01/19      PT SHORT TERM GOAL #3   Title  assess strength ( 06/01/2019)    Time  4    Period  Weeks    Status  New    Target Date  06/01/19      PT SHORT TERM GOAL #4   Title  I with bathing and dressing ( 06/01/2019)    Time  4    Period  Weeks    Status  New    Target Date  06/01/19         PT Long Term Goals - 05/04/19 0844      PT LONG TERM GOAL #1   Title  Improve posture and alignment with patient to demonstrate improved upright posture with posterior shoulder girdle engaged ( 06/29/2019)    Time  8    Period  Weeks    Status  New    Target Date  06/29/19      PT LONG TERM GOAL #2   Title  Increased AROM Rt shoulder to =/> AROM Lt shoulder with minimal to no pain  ( 06/29/2019)    Time  8    Period  Weeks    Status  New    Target Date  06/29/19      PT LONG TERM GOAL #3   Title  dmeo Rt shoulder strength =/> 4+/5 to allow return to normal activity ( 06/29/2019)    Time  8    Period  Weeks    Status  New    Target Date  06/01/19      PT LONG TERM GOAL #4   Title  Independent in HEP ( 06/29/2019)    Time  8    Period  Weeks    Status  New    Target Date  06/29/19      PT LONG TERM GOAL #5   Title  Improve FOTO to </= 44% limited ( 06/29/2019)    Time  8    Period  Weeks    Status  New    Target Date  06/29/19            Plan - 05/15/19 0841    Clinical Impression Statement  Pt's Rt shoulder ROM progressing well; PROM completed within tissue limits and no pain. Pt reported some minor pain in shoulder with IR isometric; reduced with rest and reduction of contraction.  Pt returns to MD  on Friday.    Rehab Potential  Excellent    PT Frequency  2x / week    PT Duration  8 weeks    PT Treatment/Interventions  Iontophoresis 4mg /ml Dexamethasone;Taping;Vasopneumatic Device;Patient/family education;Moist Heat;Ultrasound;Scar mobilization;Passive range of motion;Therapeutic exercise;Cryotherapy;Electrical Stimulation;Manual techniques;Dry needling    PT Next Visit Plan  PROM Rt shoulder, initial scapular work, avoid shoulder extension, modalities for pain.    Consulted and Agree with Plan of Care  Patient       Patient will benefit from skilled therapeutic intervention in order to improve the following deficits and impairments:  Decreased range of  motion, Impaired UE functional use, Increased muscle spasms, Pain, Increased edema, Decreased strength  Visit Diagnosis: Acute pain of right shoulder  Other symptoms and signs involving the musculoskeletal system  Abnormal posture     Problem List Patient Active Problem List   Diagnosis Date Noted  . Infection of prosthetic shoulder joint ([place]) 05/12/2019  . De Quervain's disease (tenosynovitis) 11/10/2018  . Shoulder pain, right 06/23/2018  . Lesion of mouth 12/23/2017  . Esophageal dysphagia 12/23/2017  . Mastocytosis 12/23/2017  . Vitiligo 12/23/2017  . Rosacea 12/23/2017  . Hypothyroidism 12/23/2017  . Anxiety and depression 12/23/2017   Kerin Perna, PTA 05/15/19 1:36 PM  [facility] [place], [place], [address] Phone: [phone]   Fax:  [phone]  Name: Kayla Lindsey MRN: [id number] Date of Birth: 1966/01/17

## 2019-05-16 ENCOUNTER — Other Ambulatory Visit (HOSPITAL_COMMUNITY): Payer: Self-pay | Admitting: Family

## 2019-05-16 ENCOUNTER — Encounter (HOSPITAL_COMMUNITY)
Admission: RE | Admit: 2019-05-16 | Discharge: 2019-05-16 | Disposition: A | Discharge status: Home / Self Care | Payer: Managed Care, Other (non HMO) | Source: Ambulatory Visit | Attending: Family | Admitting: Family

## 2019-05-16 ENCOUNTER — Ambulatory Visit (HOSPITAL_COMMUNITY)
Admission: RE | Admit: 2019-05-16 | Discharge: 2019-05-16 | Disposition: A | Discharge status: Home / Self Care | Payer: Managed Care, Other (non HMO) | Source: Ambulatory Visit | Attending: Family | Admitting: Family

## 2019-05-16 DIAGNOSIS — M009 Pyogenic arthritis, unspecified: Secondary | ICD-10-CM | POA: Insufficient documentation

## 2019-05-16 MED ORDER — HEPARIN SOD (PORK) LOCK FLUSH 100 UNIT/ML IV SOLN
INTRAVENOUS | Status: AC
  Administered 2019-05-16: 13:00:00 250 [IU]
  Filled 2019-05-16: qty 5

## 2019-05-16 MED ORDER — HEPARIN SOD (PORK) LOCK FLUSH 100 UNIT/ML IV SOLN: 250.0000 [IU] | Freq: Every day | INTRAVENOUS | Status: DC

## 2019-05-16 MED ORDER — LIDOCAINE HCL (PF) 1 % IJ SOLN
INTRAMUSCULAR | Status: DC | PRN
  Administered 2019-05-16: 10 mL

## 2019-05-16 MED ORDER — LIDOCAINE HCL 1 % IJ SOLN
INTRAMUSCULAR | Status: AC
  Filled 2019-05-16: qty 20

## 2019-05-16 MED ORDER — SODIUM CHLORIDE 0.9 % IV SOLN
INTRAVENOUS | Status: AC
  Filled 2019-05-16: qty 20

## 2019-05-16 MED ORDER — HEPARIN SOD (PORK) LOCK FLUSH 100 UNIT/ML IV SOLN: 250.0000 [IU] | INTRAVENOUS | Status: DC | PRN

## 2019-05-16 MED ORDER — SODIUM CHLORIDE 0.9 % IV SOLN
2.0000 g | Freq: Once | INTRAVENOUS | Status: AC
  Administered 2019-05-16: 2 g via INTRAVENOUS

## 2019-05-16 NOTE — Procedures (Signed)
PROCEDURE SUMMARY:  Successful placement of single lumen PICC line to left basilic vein. Length 42cm Tip at lower SVC/RA No complications PICC capped Ready for use. EBL = trace  Please see full dictation in Imaging section for details.   Judie Grieve Nicholad Kautzman PA-C 05/16/2019 12:44 PM

## 2019-05-17 ENCOUNTER — Ambulatory Visit (INDEPENDENT_AMBULATORY_CARE_PROVIDER_SITE_OTHER): Payer: Managed Care, Other (non HMO) | Admitting: Physical Therapy

## 2019-05-17 ENCOUNTER — Encounter: Payer: Self-pay | Admitting: Physical Therapy

## 2019-05-17 ENCOUNTER — Other Ambulatory Visit: Payer: Self-pay

## 2019-05-17 DIAGNOSIS — R29898 Other symptoms and signs involving the musculoskeletal system: Secondary | ICD-10-CM | POA: Diagnosis not present

## 2019-05-17 DIAGNOSIS — R293 Abnormal posture: Secondary | ICD-10-CM | POA: Diagnosis not present

## 2019-05-17 DIAGNOSIS — M25511 Pain in right shoulder: Secondary | ICD-10-CM

## 2019-05-17 DIAGNOSIS — M25611 Stiffness of right shoulder, not elsewhere classified: Secondary | ICD-10-CM

## 2019-05-17 NOTE — Patient Instructions (Signed)
Access Code: [email or web address]  URL: [email or web address]  Date: 05/17/2019  Prepared by: Kerin Perna   Exercises  Standing Scapular Retraction - 10 reps - 5-6x daily  Standing Backward Shoulder Rolls - 10 reps - 5-6x daily  Circular Shoulder Pendulum with Table Support - 10 reps - 5-6x daily  Standing 'L' Stretch at Counter - 10 reps - 5-6x daily  Isometric Shoulder Flexion at Wall - 10 reps - 1 sets - 5 hold - 2x daily - 7x weekly  Isometric Shoulder External Rotation at Wall - 10 reps - 1 sets - 5 hold - 2x daily - 7x weekly

## 2019-05-17 NOTE — Therapy (Addendum)
[place], [place], [address] Phone: [phone]   Fax:  [phone]  Physical Therapy Treatment  Patient Details  Name: Kayla Lindsey MRN: [id number] Date of Birth: 18-Jul-1965 Referring Provider (PT): Dr Ophelia Charter   Encounter Date: 05/17/2019  PT End of Session - 05/17/19 0844    Visit Number  5    Number of Visits  16    Date for PT Re-Evaluation  06/29/19    Authorization Type  CIG[employer]    PT Start Time  0800    PT Stop Time  0844    PT Time Calculation (min)  44 min    Activity Tolerance  Patient tolerated treatment well    Behavior During Therapy  [facility] for tasks assessed/performed       Past Medical History:  Diagnosis Date  . Anxiety   . Depression   . Hashimoto's thyroiditis    hypo  . Hypothyroidism   . Mastocytosis   . Osteoarthritis    right shoulder  . Rheumatoid arthritis ([place])    mgd by Dr Jillyn Ledger, Maralyn Sago   . Vitiligo     Past Surgical History:  Procedure Laterality Date  . APPENDECTOMY    . BREAST BIOPSY Bilateral   . BREAST LUMPECTOMY Right   . carpel tunnel Bilateral   . CHOLECYSTECTOMY    . HIP SURGERY Left 02/2007   laboral repair   . SHOULDER ACROMIOPLASTY Right   . TOTAL SHOULDER ARTHROPLASTY Right 04/20/2019   Procedure: TOTAL SHOULDER ARTHROPLASTY;  Surgeon: Hiram Gash, MD;  Location: [place];  Service: Orthopedics;  Laterality: Right;    There were no vitals filed for this visit.  Subjective Assessment - 05/17/19 0810    Subjective  Pt reports she was using her RUE to flat iron her hair and put her make up this morning for job interview.  Rt shoulder is very sore.    Patient Stated Goals  use Rt arm again    Currently in Pain?  Yes    Pain Score  7     Pain Location  Shoulder    Pain Orientation  Right    Pain Descriptors / Indicators  Sore    Aggravating Factors   moving arm in certain ways    Pain Relieving Factors  ice          [facility] PT Assessment - 05/17/19 0001      Assessment   Medical Diagnosis  Rt total shoulder    Referring Provider (PT)  Dr Ophelia Charter    Onset Date/Surgical Date  04/20/19    Hand Dominance  Right    Next MD Visit  05/19/2019    Prior Therapy  yes      PROM   PROM Assessment Site  Shoulder    Right/Left Shoulder  Right    Right Shoulder Flexion  135 Degrees    Right Shoulder ABduction  75 Degrees    Right Shoulder External Rotation  44 Degrees       [facility] Adult PT Treatment/Exercise - 05/17/19 0001      Self-Care   Self-Care  Other Self-Care Comments    Other Self-Care Comments   reviewed rehab protocol and precautions with pt; pt verbalized understanding.       Shoulder Exercises: Seated   Other Seated Exercises  shoulder rolls x 10    Other Seated Exercises  scap depression with Rt hand pushing into therapy ball  x 5 sec x 10 reps; Rt scap retraction/depression with both hands holding ball moving bilat shoulder into flexion (with elbow flexion) to <80 deg, then in a diagonal pattern without pain.  Working on scapular stabilization with small shoulder motions.       Shoulder Exercises: Standing   Other Standing Exercises  wall ladder with RUE in flexion x 3 reps; pendulum with tactile and VC for improved form (circles and forward/backward) x 1 min     Other Standing Exercises  scap retraction with shoulder flexion of 20 deg (and elbow flexion) with hand on ball on wall with CW/CCW x 1 min       Shoulder Exercises: Pulleys   Flexion  2 minutes      Shoulder Exercises: Isometric Strengthening   Flexion  --   verbally reviewed   External Rotation  --   verbally reviewed   Internal Rotation  --   instructed to avoid   ABduction  --   verbally reviewed.      Modalities   Modalities  Psychologist, educational Location  Rt shoulder    Electrical Stimulation Action  IFC    Electrical Stimulation  Parameters  intensity to tolerance,m 10 min     Electrical Stimulation Goals  Pain      Vasopneumatic   Number Minutes Vasopneumatic   10 minutes    Vasopnuematic Location   Shoulder   Rt   Vasopneumatic Pressure  Low    Vasopneumatic Temperature   34 deg       Manual Therapy   Soft tissue mobilization  STM to Rt pec, upper trap, rhomboid, infraspinatus, and deltoid     Scapular Mobilization  Rt shoulder in all directions (pt in supine)    Passive ROM  Rt shoulder ER (in supported scaption), flexion to tissue limits and no pain; scaption to 75 deg               PT Short Term Goals - 05/17/19 0845      PT SHORT TERM GOAL #1   Title  I with initial HEP (06/01/2019)    Time  4    Status  On-going    Target Date  06/01/19      PT SHORT TERM GOAL #2   Title  demo Rt shoulder PROM WFL ( 06/01/2019)    Time  4    Period  Weeks    Status  On-going    Target Date  06/01/19      PT SHORT TERM GOAL #3   Title  assess strength ( 06/01/2019)    Time  4    Period  Weeks    Status  On-going    Target Date  06/01/19      PT SHORT TERM GOAL #4   Title  I with bathing and dressing ( 06/01/2019)    Time  4    Period  Weeks    Status  Achieved    Target Date  06/01/19        PT Long Term Goals - 05/04/19 0844      PT LONG TERM GOAL #1   Title  Improve posture and alignment with patient to demonstrate improved upright posture with posterior shoulder girdle engaged ( 06/29/2019)    Time  8    Period  Weeks    Status  New    Target Date  06/29/19  PT LONG TERM GOAL #2   Title  Increased AROM Rt shoulder to =/> AROM Lt shoulder with minimal to no pain  ( 06/29/2019)    Time  8    Period  Weeks    Status  New    Target Date  06/29/19      PT LONG TERM GOAL #3   Title  dmeo Rt shoulder strength =/> 4+/5 to allow return to normal activity ( 06/29/2019)    Time  8    Period  Weeks    Status  New    Target Date  06/01/19      PT LONG TERM GOAL #4   Title  Independent  in HEP ( 06/29/2019)    Time  8    Period  Weeks    Status  New    Target Date  06/29/19      PT LONG TERM GOAL #5   Title  Improve FOTO to </= 44% limited ( 06/29/2019)    Time  8    Period  Weeks    Status  New    Target Date  06/29/19            Plan - 05/17/19 0845    Clinical Impression Statement  Pt is now independent with bathing and dressing; has met STG #4.  Her Rt shoulder PROM of flexion and ER have progressed well.  Pt reported reduction of pain in shoulder after STM and PROM; further reduction with use of vaso and estim at end of session.  Reviewed rehab protocol with patient and encouraged pt to follow precautions with keeping RUE in sling 24/7 until released by MD.  Progressing well within rehab protocol.    Rehab Potential  Excellent    PT Frequency  2x / week    PT Duration  8 weeks    PT Treatment/Interventions  Iontophoresis '4mg'$ /ml Dexamethasone;Taping;Vasopneumatic Device;Patient/family education;Moist Heat;Ultrasound;Scar mobilization;Passive range of motion;Therapeutic exercise;Cryotherapy;Electrical Stimulation;Manual techniques;Dry needling    PT Next Visit Plan  PROM Rt shoulder, initial scapular work, avoid shoulder extension/IR, modalities for pain.    PT Home Exercise Plan  [email or web address]    Consulted and Agree with Plan of Care  Patient       Patient will benefit from skilled therapeutic intervention in order to improve the following deficits and impairments:  Decreased range of motion, Impaired UE functional use, Increased muscle spasms, Pain, Increased edema, Decreased strength  Visit Diagnosis: Acute pain of right shoulder  Other symptoms and signs involving the musculoskeletal system  Abnormal posture  Stiffness of right shoulder, not elsewhere classified     Problem List Patient Active Problem List   Diagnosis Date Noted  . Infection of prosthetic shoulder joint ([place]) 05/12/2019  . De Quervain's disease (tenosynovitis) 11/10/2018  .  Shoulder pain, right 06/23/2018  . Lesion of mouth 12/23/2017  . Esophageal dysphagia 12/23/2017  . Mastocytosis 12/23/2017  . Vitiligo 12/23/2017  . Rosacea 12/23/2017  . Hypothyroidism 12/23/2017  . Anxiety and depression 12/23/2017   Kerin Perna, PTA 05/17/19 9:49 AM  [facility] [place], [place], [address] Phone: [phone]   Fax:  [phone]  Name: Kayla Lindsey MRN: [id number] Date of Birth: December 30, 1965

## 2019-05-22 ENCOUNTER — Other Ambulatory Visit: Payer: Self-pay

## 2019-05-22 ENCOUNTER — Ambulatory Visit (INDEPENDENT_AMBULATORY_CARE_PROVIDER_SITE_OTHER): Payer: Managed Care, Other (non HMO) | Admitting: Physical Therapy

## 2019-05-22 DIAGNOSIS — M25511 Pain in right shoulder: Secondary | ICD-10-CM | POA: Diagnosis not present

## 2019-05-22 DIAGNOSIS — M25611 Stiffness of right shoulder, not elsewhere classified: Secondary | ICD-10-CM

## 2019-05-22 DIAGNOSIS — R293 Abnormal posture: Secondary | ICD-10-CM

## 2019-05-22 DIAGNOSIS — R29898 Other symptoms and signs involving the musculoskeletal system: Secondary | ICD-10-CM

## 2019-05-22 NOTE — Patient Instructions (Signed)
Access Code: [email or web address]: [email or web address]: 03/15/2021Prepared by: [facility] [place]Exercises  Standing Scapular Retraction - 5-6 x daily - 3-5 reps - 5 hold  Standing Backward Shoulder Rolls - 5-6 x daily - 5 reps  Circular Shoulder Pendulum with Table Support - 5-6 x daily - 10 reps  Standing 'L' Stretch at Counter - 5-6 x daily - 5 reps  Isometric Shoulder Flexion at Wall - 2 x daily - 7 x weekly - 10 reps - 1 sets - 5 hold  Isometric Shoulder External Rotation at Wall - 2 x daily - 7 x weekly - 10 reps - 1 sets - 5 hold  Standing Isometric Shoulder Abduction with Doorway - Arm Bent - 2 x daily - 7 x weekly - 1 sets - 10 reps - 5 hold  L's - 1 x daily - 7 x weekly - 1 sets - 5-10 reps - 3-5 hold  Standing Shoulder Flexion to 90 Degrees - 1 x daily - 7 x weekly - 1 sets - 5-10 reps

## 2019-05-22 NOTE — Therapy (Signed)
[place], [place], [address] Phone: [phone]   Fax:  [phone]  Physical Therapy Treatment  Patient Details  Name: Kayla Lindsey MRN: [id number] Date of Birth: 02/27/1966 Referring Provider (PT): Dr Ophelia Charter   Encounter Date: 05/22/2019  PT End of Session - 05/22/19 0806    Visit Number  6    Number of Visits  16    Date for PT Re-Evaluation  06/29/19    Authorization Type  CIGNA    PT Start Time  0802    PT Stop Time  0847    PT Time Calculation (min)  45 min    Activity Tolerance  Patient tolerated treatment well    Behavior During Therapy  [facility] for tasks assessed/performed       Past Medical History:  Diagnosis Date  . Anxiety   . Depression   . Hashimoto's thyroiditis    hypo  . Hypothyroidism   . Mastocytosis   . Osteoarthritis    right shoulder  . Rheumatoid arthritis ([place])    mgd by Dr Jillyn Ledger, Maralyn Sago   . Vitiligo     Past Surgical History:  Procedure Laterality Date  . APPENDECTOMY    . BREAST BIOPSY Bilateral   . BREAST LUMPECTOMY Right   . carpel tunnel Bilateral   . CHOLECYSTECTOMY    . HIP SURGERY Left 02/2007   laboral repair   . SHOULDER ACROMIOPLASTY Right   . TOTAL SHOULDER ARTHROPLASTY Right 04/20/2019   Procedure: TOTAL SHOULDER ARTHROPLASTY;  Surgeon: Hiram Gash, MD;  Location: [place];  Service: Orthopedics;  Laterality: Right;    There were no vitals filed for this visit.  Subjective Assessment - 05/22/19 0806    Subjective  Pt reports she visited MD, and she has been cleared of the sling.  she has a 5# weight restriction and was reminded to not perform ext or IR to protect the subscap repair.  She is "pretty much off of it" (any pain medication).    Currently in Pain?  Yes    Pain Score  4     Pain Location  Shoulder    Pain Orientation  Right    Pain Descriptors / Indicators  Sore    Aggravating Factors   moving  arm in certain ways    Pain Relieving Factors  ice, medicine         [facility] PT Assessment - 05/22/19 0001      Assessment   Medical Diagnosis  Rt total shoulder    Referring Provider (PT)  Dr Ophelia Charter    Onset Date/Surgical Date  04/20/19    Hand Dominance  Right    Next MD Visit  07/21/19    Prior Therapy  yes       [facility] Adult PT Treatment/Exercise - 05/22/19 0001      Shoulder Exercises: Standing   External Rotation  --    Flexion  AROM;Right;10 reps   to 62 deg in front of mirro   ABduction  AROM;Right;10 reps   to 49 deg in front of mirror   Other Standing Exercises  scap retraction with bilat hands holding small orange ball, performing elbow flexion, small diagonals, and pronation supination, to pt tolerance.  pendulum (and just dangling arm) x 30 sec     Other Standing Exercises  L's x 5 sec x 5 reps,  W's x 5 sec x 5 reps- back against pool noodle.  shoulder rolls x 10 backwards.       Shoulder Exercises: Pulleys   Flexion  3 minutes    Scaption  2 minutes      Shoulder Exercises: Isometric Strengthening   Flexion  5X5"    External Rotation  --   10 reps, 5 sec    Internal Rotation  --   instructed to avoid   ABduction  5X5"      Modalities   Modalities  --   pt reported no reduction of pain with estim last visit     Electrical Stimulation   Electrical Stimulation Location  --   deferred     Vasopneumatic   Number Minutes Vasopneumatic   10 minutes    Vasopnuematic Location   Shoulder   Rt   Vasopneumatic Pressure  Low    Vasopneumatic Temperature   34 deg       Manual Therapy   Soft tissue mobilization  STM to Rt pec, upper trap, rhomboid, infraspinatus, and deltoid              PT Education - 05/22/19 0851    Education Details  updated HEP    Person(s) Educated  Patient    Methods  Explanation    Comprehension  Verbalized understanding;Returned demonstration;Verbal cues required       PT Short Term Goals - 05/17/19 0845      PT  SHORT TERM GOAL #1   Title  I with initial HEP (06/01/2019)    Time  4    Status  On-going    Target Date  06/01/19      PT SHORT TERM GOAL #2   Title  demo Rt shoulder PROM WFL ( 06/01/2019)    Time  4    Period  Weeks    Status  On-going    Target Date  06/01/19      PT SHORT TERM GOAL #3   Title  assess strength ( 06/01/2019)    Time  4    Period  Weeks    Status  On-going    Target Date  06/01/19      PT SHORT TERM GOAL #4   Title  I with bathing and dressing ( 06/01/2019)    Time  4    Period  Weeks    Status  Achieved    Target Date  06/01/19        PT Long Term Goals - 05/04/19 0844      PT LONG TERM GOAL #1   Title  Improve posture and alignment with patient to demonstrate improved upright posture with posterior shoulder girdle engaged ( 06/29/2019)    Time  8    Period  Weeks    Status  New    Target Date  06/29/19      PT LONG TERM GOAL #2   Title  Increased AROM Rt shoulder to =/> AROM Lt shoulder with minimal to no pain  ( 06/29/2019)    Time  8    Period  Weeks    Status  New    Target Date  06/29/19      PT LONG TERM GOAL #3   Title  dmeo Rt shoulder strength =/> 4+/5 to allow return to normal activity ( 06/29/2019)    Time  8    Period  Weeks    Status  New    Target Date  06/01/19      PT LONG TERM GOAL #4  Title  Independent in [place] ( 06/29/2019)    Time  8    Period  Weeks    Status  New    Target Date  06/29/19      PT LONG TERM GOAL #5   Title  Improve FOTO to </= 44% limited ( 06/29/2019)    Time  8    Period  Weeks    Status  New    Target Date  06/29/19            Plan - 05/22/19 0857    Clinical Impression Statement  Pt progressing well with AAROM and AROM, with Rt shoulder flexion, abdct, and ER. Pt did have some spasming in posterior Rt shoulder when transitioning to sitting after STM in supine.  Pt progressing well within rehab protocol.    Rehab Potential  Excellent    PT Frequency  2x / week    PT Duration  8 weeks     PT Treatment/Interventions  Iontophoresis 4mg /ml Dexamethasone;Taping;Vasopneumatic Device;Patient/family education;Moist Heat;Ultrasound;Scar mobilization;Passive range of motion;Therapeutic exercise;Cryotherapy;Electrical Stimulation;Manual techniques;Dry needling    PT Next Visit Plan  continue progressive ROM and scapular strengthening within protocol; avoid shoulder extension/IR, modalities for pain.    PT Home Exercise Plan  [email or web address]    Consulted and Agree with Plan of Care  Patient       Patient will benefit from skilled therapeutic intervention in order to improve the following deficits and impairments:  Decreased range of motion, Impaired UE functional use, Increased muscle spasms, Pain, Increased edema, Decreased strength  Visit Diagnosis: Acute pain of right shoulder  Stiffness of right shoulder, not elsewhere classified  Other symptoms and signs involving the musculoskeletal system  Abnormal posture     Problem List Patient Active Problem List   Diagnosis Date Noted  . Infection of prosthetic shoulder joint ([place]) 05/12/2019  . De Quervain's disease (tenosynovitis) 11/10/2018  . Shoulder pain, right 06/23/2018  . Lesion of mouth 12/23/2017  . Esophageal dysphagia 12/23/2017  . Mastocytosis 12/23/2017  . Vitiligo 12/23/2017  . Rosacea 12/23/2017  . Hypothyroidism 12/23/2017  . Anxiety and depression 12/23/2017   Kerin Perna, PTA 05/22/19 9:02 AM  [place], [place], [address] Phone: [phone]   Fax:  [phone]  Name: Kayla Lindsey MRN: [id number] Date of Birth: 07-02-1965

## 2019-05-23 ENCOUNTER — Other Ambulatory Visit (HOSPITAL_COMMUNITY)
Admit: 2019-05-23 | Discharge: 2019-05-23 | Disposition: A | Discharge status: Home / Self Care | Payer: Managed Care, Other (non HMO) | Source: Ambulatory Visit | Attending: Internal Medicine | Admitting: Internal Medicine

## 2019-05-23 DIAGNOSIS — M25511 Pain in right shoulder: Secondary | ICD-10-CM | POA: Diagnosis not present

## 2019-05-23 DIAGNOSIS — X58XXXA Exposure to other specified factors, initial encounter: Secondary | ICD-10-CM | POA: Insufficient documentation

## 2019-05-23 DIAGNOSIS — T847XXA Infection and inflammatory reaction due to other internal orthopedic prosthetic devices, implants and grafts, initial encounter: Secondary | ICD-10-CM | POA: Insufficient documentation

## 2019-05-23 LAB — CBC
HCT: 40.2 % (ref 36.0–46.0)
Hemoglobin: 13 g/dL (ref 12.0–15.0)
MCH: 29.6 pg (ref 26.0–34.0)
MCHC: 32.3 g/dL (ref 30.0–36.0)
MCV: 91.6 fL (ref 80.0–100.0)
Platelets: 297 10*3/uL (ref 150–400)
RBC: 4.39 MIL/uL (ref 3.87–5.11)
RDW: 12.9 % (ref 11.5–15.5)
WBC: 7.8 10*3/uL (ref 4.0–10.5)
nRBC: 0 % (ref 0.0–0.2)

## 2019-05-23 LAB — C-REACTIVE PROTEIN: CRP: 0.7 mg/dL (ref <1.0)

## 2019-05-23 LAB — BASIC METABOLIC PANEL
Anion gap: 11 (ref 5–15)
BUN: 17 mg/dL (ref 6–20)
CO2: 23 mmol/L (ref 22–32)
Calcium: 9.3 mg/dL (ref 8.9–10.3)
Chloride: 105 mmol/L (ref 98–111)
Creatinine, Ser: 0.87 mg/dL (ref 0.44–1.00)
GFR calc Af Amer: >60 mL/min (ref >60)
GFR calc non Af Amer: >60 mL/min (ref >60)
Glucose, Bld: 97 mg/dL (ref 70–99)
Potassium: 4 mmol/L (ref 3.5–5.1)
Sodium: 139 mmol/L (ref 135–145)

## 2019-05-23 LAB — SEDIMENTATION RATE: Sed Rate: 8 mm/hr (ref 0–22)

## 2019-05-25 ENCOUNTER — Ambulatory Visit (INDEPENDENT_AMBULATORY_CARE_PROVIDER_SITE_OTHER): Payer: Managed Care, Other (non HMO) | Admitting: Physical Therapy

## 2019-05-25 ENCOUNTER — Other Ambulatory Visit: Payer: Self-pay

## 2019-05-25 ENCOUNTER — Encounter: Payer: Self-pay | Admitting: Physical Therapy

## 2019-05-25 DIAGNOSIS — M25511 Pain in right shoulder: Secondary | ICD-10-CM

## 2019-05-25 DIAGNOSIS — M25611 Stiffness of right shoulder, not elsewhere classified: Secondary | ICD-10-CM | POA: Diagnosis not present

## 2019-05-25 DIAGNOSIS — R29898 Other symptoms and signs involving the musculoskeletal system: Secondary | ICD-10-CM

## 2019-05-25 DIAGNOSIS — R293 Abnormal posture: Secondary | ICD-10-CM | POA: Diagnosis not present

## 2019-05-25 NOTE — Therapy (Signed)
[place], [place], [address] Phone: [phone]   Fax:  [phone]  Physical Therapy Treatment  Patient Details  Name: LANEA VANKIRK MRN: [id number] Date of Birth: [birth year] Referring Provider (PT): Dr Ophelia Charter   Encounter Date: 05/25/2019  PT End of Session - 05/25/19 0855    Visit Number  7    Number of Visits  16    Date for PT Re-Evaluation  06/29/19    Authorization Type  CIGNA    PT Start Time  0802    PT Stop Time  0845    PT Time Calculation (min)  43 min    Activity Tolerance  Patient tolerated treatment well;Patient limited by pain    Behavior During Therapy  [facility] for tasks assessed/performed       Past Medical History:  Diagnosis Date  . Anxiety   . Depression   . Hashimoto's thyroiditis    hypo  . Hypothyroidism   . Mastocytosis   . Osteoarthritis    right shoulder  . Rheumatoid arthritis ([place])    mgd by Dr Jillyn Ledger, Maralyn Sago   . Vitiligo     Past Surgical History:  Procedure Laterality Date  . APPENDECTOMY    . BREAST BIOPSY Bilateral   . BREAST LUMPECTOMY Right   . carpel tunnel Bilateral   . CHOLECYSTECTOMY    . HIP SURGERY Left 02/2007   laboral repair   . SHOULDER ACROMIOPLASTY Right   . TOTAL SHOULDER ARTHROPLASTY Right 04/20/2019   Procedure: TOTAL SHOULDER ARTHROPLASTY;  Surgeon: Hiram Gash, MD;  Location: [place];  Service: Orthopedics;  Laterality: Right;    There were no vitals filed for this visit.  Subjective Assessment - 05/25/19 0856    Subjective  Pt reports she didn't sleep well last night. Rt shoulder is "screaming at her" and "angry" this morning. She is tempted to wear the sling to let her (Rt) shoulder rest.  Her Rt side of neck is also tight.    Currently in Pain?  Yes    Pain Score  7     Pain Location  Shoulder    Pain Orientation  Right    Pain Descriptors / Indicators  Sharp;Sore    Aggravating Factors    reaching with Rt arm    Pain Relieving Factors  ice, rest         [facility] PT Assessment - 05/25/19 0001      Assessment   Medical Diagnosis  Rt total shoulder    Referring Provider (PT)  Dr Ophelia Charter    Onset Date/Surgical Date  04/20/19    Hand Dominance  Right    Next MD Visit  07/21/19    Prior Therapy  yes       [facility] Adult PT Treatment/Exercise - 05/25/19 0001      Shoulder Exercises: Seated   External Rotation  AAROM;Right;10 reps   cane     Shoulder Exercises: Standing   Flexion  AROM;Right;5 reps   in front of mirror   ABduction  AROM;Right;10 reps   in front of mirror, cues to depress shoulder   Other Standing Exercises  L's x 5 sec x 10 reps, shoulder rolls x 10 backwards.       Shoulder Exercises: Pulleys   Flexion  3 minutes   range to tolerance and tissue limits   Scaption  2 minutes   range kept below 70 deg  Acupuncturist Location  Rt neck; Rt ant/post shoulder    Electrical Stimulation Action  premod to each area    Electrical Stimulation Parameters  x 10 min, intensity to tolerance    Electrical Stimulation Goals  Pain      Vasopneumatic   Number Minutes Vasopneumatic   10 minutes    Vasopnuematic Location   Shoulder   Rt   Vasopneumatic Pressure  Low    Vasopneumatic Temperature   34 deg       Manual Therapy   Soft tissue mobilization  STM to Rt levator, rhomboid, mid thoracic paraspinals, Rt pec, Rt upper trap, scalenes and cervical paraspinals.   suboccipital release and gentle traction to neck.  pin and stretch to Rt upper tra; and scalenes.     Scapular Mobilization  Rt shoulder in all directions (pt in supine and sidelying)                PT Short Term Goals - 05/17/19 0845      PT SHORT TERM GOAL #1   Title  I with initial HEP (06/01/2019)    Time  4    Status  On-going    Target Date  06/01/19      PT SHORT TERM GOAL #2   Title  demo Rt shoulder PROM WFL ( 06/01/2019)    Time  4     Period  Weeks    Status  On-going    Target Date  06/01/19      PT SHORT TERM GOAL #3   Title  assess strength ( 06/01/2019)    Time  4    Period  Weeks    Status  On-going    Target Date  06/01/19      PT SHORT TERM GOAL #4   Title  I with bathing and dressing ( 06/01/2019)    Time  4    Period  Weeks    Status  Achieved    Target Date  06/01/19        PT Long Term Goals - 05/04/19 0844      PT LONG TERM GOAL #1   Title  Improve posture and alignment with patient to demonstrate improved upright posture with posterior shoulder girdle engaged ( 06/29/2019)    Time  8    Period  Weeks    Status  New    Target Date  06/29/19      PT LONG TERM GOAL #2   Title  Increased AROM Rt shoulder to =/> AROM Lt shoulder with minimal to no pain  ( 06/29/2019)    Time  8    Period  Weeks    Status  New    Target Date  06/29/19      PT LONG TERM GOAL #3   Title  dmeo Rt shoulder strength =/> 4+/5 to allow return to normal activity ( 06/29/2019)    Time  8    Period  Weeks    Status  New    Target Date  06/01/19      PT LONG TERM GOAL #4   Title  Independent in HEP ( 06/29/2019)    Time  8    Period  Weeks    Status  New    Target Date  06/29/19      PT LONG TERM GOAL #5   Title  Improve FOTO to </= 44% limited ( 06/29/2019)    Time  8    Period  Weeks    Status  New    Target Date  06/29/19            Plan - 05/25/19 0908    Clinical Impression Statement  Pt presents with flare up of pain in Rt shoulder, likely from increased use and rolling onto it in sleep last night.  ROM for shoulder kept within tissue limits and no increase in pain.  She has met her rehab protocol ROM goals; awaits next phase in protocol.  Pt reported reduction of pain in shoulder at end of session.    Rehab Potential  Excellent    PT Frequency  2x / week    PT Duration  8 weeks    PT Treatment/Interventions  Iontophoresis 54m/ml Dexamethasone;Taping;Vasopneumatic Device;Patient/family  education;Moist Heat;Ultrasound;Scar mobilization;Passive range of motion;Therapeutic exercise;Cryotherapy;Electrical Stimulation;Manual techniques;Dry needling    PT Next Visit Plan  continue progressive ROM and scapular strengthening within protocol; avoid shoulder extension/IR until 3/25 (6 wks), modalities for pain.    PT Home Exercise Plan  [email or web address]    Consulted and Agree with Plan of Care  Patient       Patient will benefit from skilled therapeutic intervention in order to improve the following deficits and impairments:  Decreased range of motion, Impaired UE functional use, Increased muscle spasms, Pain, Increased edema, Decreased strength  Visit Diagnosis: Acute pain of right shoulder  Stiffness of right shoulder, not elsewhere classified  Other symptoms and signs involving the musculoskeletal system  Abnormal posture     Problem List Patient Active Problem List   Diagnosis Date Noted  . Infection of [place] shoulder joint ([place] 05/12/2019  . De Quervain's disease (tenosynovitis) 11/10/2018  . Shoulder pain, right 06/23/2018  . Lesion of mouth 12/23/2017  . Esophageal dysphagia 12/23/2017  . Mastocytosis 12/23/2017  . Vitiligo 12/23/2017  . Rosacea 12/23/2017  . Hypothyroidism 12/23/2017  . Anxiety and depression 12/23/2017   JKerin Perna PTA 05/25/19 9:11 AM  [facility] [address]Phone: [phone]  Fax:  [phone] Name: KRODINA PINALESMRN: [id number] of Birth: 9August 30, 1967

## 2019-05-29 ENCOUNTER — Encounter: Payer: Managed Care, Other (non HMO) | Admitting: Physical Therapy

## 2019-05-30 ENCOUNTER — Other Ambulatory Visit (HOSPITAL_COMMUNITY)
Admit: 2019-05-30 | Discharge: 2019-05-30 | Disposition: A | Discharge status: Home / Self Care | Payer: Managed Care, Other (non HMO) | Source: Ambulatory Visit | Attending: Internal Medicine | Admitting: Internal Medicine

## 2019-05-30 DIAGNOSIS — M25511 Pain in right shoulder: Secondary | ICD-10-CM | POA: Diagnosis not present

## 2019-05-30 DIAGNOSIS — T8459XA Infection and inflammatory reaction due to other internal joint prosthesis, initial encounter: Secondary | ICD-10-CM | POA: Diagnosis not present

## 2019-05-30 DIAGNOSIS — M654 Radial styloid tenosynovitis [de Quervain]: Secondary | ICD-10-CM | POA: Insufficient documentation

## 2019-05-30 LAB — CBC
HCT: 39.7 % (ref 36.0–46.0)
Hemoglobin: 13.1 g/dL (ref 12.0–15.0)
MCH: 30 pg (ref 26.0–34.0)
MCHC: 33 g/dL (ref 30.0–36.0)
MCV: 90.8 fL (ref 80.0–100.0)
Platelets: 307 10*3/uL (ref 150–400)
RBC: 4.37 MIL/uL (ref 3.87–5.11)
RDW: 13.1 % (ref 11.5–15.5)
WBC: 6.2 10*3/uL (ref 4.0–10.5)
nRBC: 0 % (ref 0.0–0.2)

## 2019-05-30 LAB — BASIC METABOLIC PANEL
Anion gap: 11 (ref 5–15)
BUN: 15 mg/dL (ref 6–20)
CO2: 20 mmol/L — ABNORMAL LOW (ref 22–32)
Calcium: 9.2 mg/dL (ref 8.9–10.3)
Chloride: 107 mmol/L (ref 98–111)
Creatinine, Ser: 0.69 mg/dL (ref 0.44–1.00)
GFR calc Af Amer: >60 mL/min (ref >60)
GFR calc non Af Amer: >60 mL/min (ref >60)
Glucose, Bld: 99 mg/dL (ref 70–99)
Potassium: 4 mmol/L (ref 3.5–5.1)
Sodium: 138 mmol/L (ref 135–145)

## 2019-05-30 LAB — C-REACTIVE PROTEIN: CRP: 0.6 mg/dL (ref <1.0)

## 2019-05-30 LAB — SEDIMENTATION RATE: Sed Rate: 8 mm/hr (ref 0–22)

## 2019-05-31 ENCOUNTER — Ambulatory Visit (INDEPENDENT_AMBULATORY_CARE_PROVIDER_SITE_OTHER): Payer: Managed Care, Other (non HMO) | Admitting: Rehabilitative and Restorative Service Providers"

## 2019-05-31 ENCOUNTER — Encounter: Payer: Self-pay | Admitting: Rehabilitative and Restorative Service Providers"

## 2019-05-31 ENCOUNTER — Other Ambulatory Visit: Payer: Self-pay

## 2019-05-31 DIAGNOSIS — R29898 Other symptoms and signs involving the musculoskeletal system: Secondary | ICD-10-CM

## 2019-05-31 DIAGNOSIS — M25611 Stiffness of right shoulder, not elsewhere classified: Secondary | ICD-10-CM

## 2019-05-31 DIAGNOSIS — M25511 Pain in right shoulder: Secondary | ICD-10-CM

## 2019-05-31 DIAGNOSIS — R293 Abnormal posture: Secondary | ICD-10-CM | POA: Diagnosis not present

## 2019-05-31 NOTE — Therapy (Signed)
[place], [place], [address] Phone: [phone]   Fax:  [phone]  Physical Therapy Treatment  Patient Details  Name: Kayla Lindsey MRN: [id number] Date of Birth: [birth year] Referring Provider (PT): Dr Ophelia Charter   Encounter Date: 05/31/2019  PT End of Session - 05/31/19 0802    Visit Number  8    Number of Visits  16    Date for PT Re-Evaluation  06/29/19    PT Start Time  0800    PT Stop Time  0845    PT Time Calculation (min)  45 min    Activity Tolerance  Patient tolerated treatment well       Past Medical History:  Diagnosis Date  . Anxiety   . Depression   . Hashimoto's thyroiditis    hypo  . Hypothyroidism   . Mastocytosis   . Osteoarthritis    right shoulder  . Rheumatoid arthritis ([place])    mgd by Dr Jillyn Ledger, Maralyn Sago   . Vitiligo     Past Surgical History:  Procedure Laterality Date  . APPENDECTOMY    . BREAST BIOPSY Bilateral   . BREAST LUMPECTOMY Right   . carpel tunnel Bilateral   . CHOLECYSTECTOMY    . HIP SURGERY Left 02/2007   laboral repair   . SHOULDER ACROMIOPLASTY Right   . TOTAL SHOULDER ARTHROPLASTY Right 04/20/2019   Procedure: TOTAL SHOULDER ARTHROPLASTY;  Surgeon: Hiram Gash, MD;  Location: [facility];  Service: Orthopedics;  Laterality: Right;    There were no vitals filed for this visit.  Subjective Assessment - 05/31/19 0803    Subjective  Shoulder is better today than it was last week. Trying to walk several miles several days a week and the shoulder will hurt when she is walking. Discussed ways to support arm/shoulder with walking.    Currently in Pain?  Yes    Pain Score  4     Pain Location  Shoulder    Pain Orientation  Right    Pain Descriptors / Indicators  Aching;Sore;Constant    Pain Type  Chronic pain;Surgical pain    Pain Onset  More than a month ago    Pain Frequency  Constant                        [facility] Adult PT Treatment/Exercise - 05/31/19 0001      Therapeutic Activites    Therapeutic Activities  --   insructed in myofacial ball release standing      Shoulder Exercises: Standing   Flexion  AROM;Right;5 reps   scapular control with noodle    Flexion Limitations  small reach forward maintaining scapular control     Other Standing Exercises  scapyular retraction with noodle 10 sec x 10 reps     Other Standing Exercises  L's x 5 sec x 10 reps, shoulder rolls x 10 backwards.       Shoulder Exercises: Pulleys   Flexion  --   10 reps 10 sec hold    Scaption  --   10 sec hold x 10 reps      Electrical Stimulation   Electrical Stimulation Location  Rt shoulder girdle     Electrical Stimulation Action  IFC    Electrical Stimulation Parameters  to tolerance    Electrical Stimulation Goals  Pain;Tone      Vasopneumatic   Number Minutes Vasopneumatic   10  minutes    Vasopnuematic Location   Shoulder   Rt   Vasopneumatic Pressure  Low    Vasopneumatic Temperature   34 deg       Manual Therapy   Manual therapy comments  pt supine/hooklying with bolster     Soft tissue mobilization  STM to Rt levator, rhomboid, mid thoracic paraspinals, Rt pec, Rt upper trap, scalenes and cervical paraspinals.   suboccipital release and gentle traction to neck.  pin and stretch to Rt upper tra; and scalenes.     Scapular Mobilization  Rt shoulder in all directions (pt in supine and sidelying)     Passive ROM  Rt shoulder flexion; ER in scapular plane     Manual Traction  Rt arm distraction               PT Short Term Goals - 05/17/19 0845      PT SHORT TERM GOAL #1   Title  I with initial HEP (06/01/2019)    Time  4    Status  On-going    Target Date  06/01/19      PT SHORT TERM GOAL #2   Title  demo Rt shoulder PROM WFL ( 06/01/2019)    Time  4    Period  Weeks    Status  On-going    Target Date  06/01/19      PT SHORT TERM GOAL #3   Title   assess strength ( 06/01/2019)    Time  4    Period  Weeks    Status  On-going    Target Date  06/01/19      PT SHORT TERM GOAL #4   Title  I with bathing and dressing ( 06/01/2019)    Time  4    Period  Weeks    Status  Achieved    Target Date  06/01/19        PT Long Term Goals - 05/04/19 0844      PT LONG TERM GOAL #1   Title  Improve posture and alignment with patient to demonstrate improved upright posture with posterior shoulder girdle engaged ( 06/29/2019)    Time  8    Period  Weeks    Status  New    Target Date  06/29/19      PT LONG TERM GOAL #2   Title  Increased AROM Rt shoulder to =/> AROM Lt shoulder with minimal to no pain  ( 06/29/2019)    Time  8    Period  Weeks    Status  New    Target Date  06/29/19      PT LONG TERM GOAL #3   Title  dmeo Rt shoulder strength =/> 4+/5 to allow return to normal activity ( 06/29/2019)    Time  8    Period  Weeks    Status  New    Target Date  06/01/19      PT LONG TERM GOAL #4   Title  Independent in HEP ( 06/29/2019)    Time  8    Period  Weeks    Status  New    Target Date  06/29/19      PT LONG TERM GOAL #5   Title  Improve FOTO to </= 44% limited ( 06/29/2019)    Time  8    Period  Weeks    Status  New    Target Date  06/29/19  Plan - 05/31/19 0807    Clinical Impression Statement  Improved shoulder pain today - back to normal constant ache today. Discussed support for shoulder when walking. Continued with exercise and manual work per protocol.    Rehab Potential  Excellent    PT Frequency  2x / week    PT Duration  8 weeks    PT Treatment/Interventions  Iontophoresis 4mg /ml Dexamethasone;Taping;Vasopneumatic Device;Patient/family education;Moist Heat;Ultrasound;Scar mobilization;Passive range of motion;Therapeutic exercise;Cryotherapy;Electrical Stimulation;Manual techniques;Dry needling    PT Next Visit Plan  continue progressive ROM and scapular strengthening within protocol; avoid shoulder  extension/IR until 3/25 (6 wks), modalities for pain.    PT Home Exercise Plan  [email or web address]    Consulted and Agree with Plan of Care  Patient       Patient will benefit from skilled therapeutic intervention in order to improve the following deficits and impairments:     Visit Diagnosis: Acute pain of right shoulder  Stiffness of right shoulder, not elsewhere classified  Other symptoms and signs involving the musculoskeletal system  Abnormal posture     Problem List Patient Active Problem List   Diagnosis Date Noted  . 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 De Quervain's disease (tenosynovitis) 11/10/2018  . Shoulder pain, right 06/23/2018  . Lesion of mouth 12/23/2017  . Esophageal dysphagia 12/23/2017  . Mastocytosis 12/23/2017  . Vitiligo 12/23/2017  . Rosacea 12/23/2017  . Hypothyroidism 12/23/2017  . Anxiety and depression 12/23/2017    Kayla Lindsey Nilda Simmer PT, MPH  05/31/2019, 8:44 AM  [facility] [place], [place], [address] Phone: [phone]   Fax:  [phone]  Name: Kayla Lindsey MRN: [id number] Date of Birth: [birth year]

## 2019-06-06 ENCOUNTER — Encounter: Payer: Self-pay | Admitting: Rehabilitative and Restorative Service Providers"

## 2019-06-06 ENCOUNTER — Other Ambulatory Visit (HOSPITAL_COMMUNITY)
Admission: RE | Admit: 2019-06-06 | Discharge: 2019-06-06 | Disposition: A | Discharge status: Home / Self Care | Payer: Managed Care, Other (non HMO) | Source: Other Acute Inpatient Hospital | Attending: Internal Medicine | Admitting: Internal Medicine

## 2019-06-06 ENCOUNTER — Ambulatory Visit (INDEPENDENT_AMBULATORY_CARE_PROVIDER_SITE_OTHER): Payer: Managed Care, Other (non HMO) | Admitting: Rehabilitative and Restorative Service Providers"

## 2019-06-06 ENCOUNTER — Other Ambulatory Visit: Payer: Self-pay

## 2019-06-06 DIAGNOSIS — X58XXXA Exposure to other specified factors, initial encounter: Secondary | ICD-10-CM | POA: Insufficient documentation

## 2019-06-06 DIAGNOSIS — R29898 Other symptoms and signs involving the musculoskeletal system: Secondary | ICD-10-CM

## 2019-06-06 DIAGNOSIS — R293 Abnormal posture: Secondary | ICD-10-CM

## 2019-06-06 DIAGNOSIS — T8459XA Infection and inflammatory reaction due to other internal joint prosthesis, initial encounter: Secondary | ICD-10-CM | POA: Diagnosis present

## 2019-06-06 DIAGNOSIS — M654 Radial styloid tenosynovitis [de Quervain]: Secondary | ICD-10-CM | POA: Diagnosis not present

## 2019-06-06 DIAGNOSIS — M25511 Pain in right shoulder: Secondary | ICD-10-CM | POA: Diagnosis not present

## 2019-06-06 DIAGNOSIS — M25611 Stiffness of right shoulder, not elsewhere classified: Secondary | ICD-10-CM | POA: Diagnosis not present

## 2019-06-06 LAB — CBC
HCT: 37.7 % (ref 36.0–46.0)
Hemoglobin: 12.3 g/dL (ref 12.0–15.0)
MCH: 30.2 pg (ref 26.0–34.0)
MCHC: 32.6 g/dL (ref 30.0–36.0)
MCV: 92.6 fL (ref 80.0–100.0)
Platelets: 352 10*3/uL (ref 150–400)
RBC: 4.07 MIL/uL (ref 3.87–5.11)
RDW: 13.2 % (ref 11.5–15.5)
WBC: 6.7 10*3/uL (ref 4.0–10.5)
nRBC: 0 % (ref 0.0–0.2)

## 2019-06-06 LAB — SEDIMENTATION RATE: Sed Rate: 7 mm/hr (ref 0–22)

## 2019-06-06 LAB — BASIC METABOLIC PANEL
Anion gap: 10 (ref 5–15)
BUN: 18 mg/dL (ref 6–20)
CO2: 21 mmol/L — ABNORMAL LOW (ref 22–32)
Calcium: 8.9 mg/dL (ref 8.9–10.3)
Chloride: 108 mmol/L (ref 98–111)
Creatinine, Ser: 0.75 mg/dL (ref 0.44–1.00)
GFR calc Af Amer: >60 mL/min (ref >60)
GFR calc non Af Amer: >60 mL/min (ref >60)
Glucose, Bld: 79 mg/dL (ref 70–99)
Potassium: 3.8 mmol/L (ref 3.5–5.1)
Sodium: 139 mmol/L (ref 135–145)

## 2019-06-06 LAB — C-REACTIVE PROTEIN: CRP: 0.6 mg/dL (ref <1.0)

## 2019-06-06 NOTE — Patient Instructions (Signed)
Access Code: [email or web address]: [email or web address]: 03/30/2021Prepared by: Lorijean Husser HoltExercises  Standing Scapular Retraction - 5-6 x daily - 3-5 reps - 5 hold  Standing Backward Shoulder Rolls - 5-6 x daily - 5 reps  Circular Shoulder Pendulum with Table Support - 5-6 x daily - 10 reps  Standing 'L' Stretch at Counter - 5-6 x daily - 5 reps  Isometric Shoulder Flexion at Wall - 2 x daily - 7 x weekly - 10 reps - 1 sets - 5 hold  Isometric Shoulder External Rotation at Wall - 2 x daily - 7 x weekly - 10 reps - 1 sets - 5 hold  Standing Isometric Shoulder Abduction with Doorway - Arm Bent - 2 x daily - 7 x weekly - 1 sets - 10 reps - 5 hold  L's - 1 x daily - 7 x weekly - 1 sets - 5-10 reps - 3-5 hold  Standing Shoulder Flexion to 90 Degrees - 1 x daily - 7 x weekly - 1 sets - 5-10 reps  Standing Shoulder Extension - 2 x daily - 7 x weekly - 3 reps - 1 sets - 30 sec hold  Shoulder External Rotation and Scapular Retraction with Resistance - 2 x daily - 7 x weekly - 1 sets - 10 reps - 2sec hold  Standing Shoulder Internal Rotation Stretch with Hands Behind Back - 2 x daily - 7 x weekly - 1 sets - 3 reps - 10 sec hold  Supine Scapular Protraction in Flexion with Dumbbells - 2 x daily - 7 x weekly - 1 sets - 10 reps - 0 hold

## 2019-06-06 NOTE — Therapy (Signed)
[place], [place], [address] Phone: [phone]   Fax:  [phone]  Physical Therapy Treatment  Patient Details  Name: Kayla Lindsey MRN: [id number] Date of Birth: 02-Sep-1965 Referring Provider (PT): Dr Ophelia Charter   Encounter Date: 06/06/2019  PT End of Session - 06/06/19 0805    Visit Number  9    Number of Visits  16    Date for PT Re-Evaluation  06/29/19    PT Start Time  0803    PT Stop Time  0858    PT Time Calculation (min)  55 min    Activity Tolerance  Patient tolerated treatment well       Past Medical History:  Diagnosis Date  . Anxiety   . Depression   . Hashimoto's thyroiditis    hypo  . Hypothyroidism   . Mastocytosis   . Osteoarthritis    right shoulder  . Rheumatoid arthritis ([place])    mgd by Dr Jillyn Ledger, Maralyn Sago   . Vitiligo     Past Surgical History:  Procedure Laterality Date  . APPENDECTOMY    . BREAST BIOPSY Bilateral   . BREAST LUMPECTOMY Right   . carpel tunnel Bilateral   . CHOLECYSTECTOMY    . HIP SURGERY Left 02/2007   laboral repair   . SHOULDER ACROMIOPLASTY Right   . TOTAL SHOULDER ARTHROPLASTY Right 04/20/2019   Procedure: TOTAL SHOULDER ARTHROPLASTY;  Surgeon: Hiram Gash, MD;  Location: [facility];  Service: Orthopedics;  Laterality: Right;    There were no vitals filed for this visit.  Subjective Assessment - 06/06/19 0806    Subjective  Patient reports that she has increased pain in the morning - thinks she must sleep in the wrong position - she sleeps on the Lt shide with Rt shoulder falling forward. Will modify the sleep position - using pillow to rest Rt UE on    Currently in Pain?  Yes    Pain Score  4     Pain Location  Shoulder    Pain Orientation  Right    Pain Descriptors / Indicators  Aching;Sore;Constant    Pain Type  Chronic pain;Surgical pain    Pain Onset  More than a month ago    Aggravating Factors    reaching w/ Rt arm; sleeping    Pain Relieving Factors  ice; rest         [facility] PT Assessment - 06/06/19 0001      Assessment   Medical Diagnosis  Rt total shoulder    Referring Provider (PT)  Dr Ophelia Charter    Onset Date/Surgical Date  04/20/19    Hand Dominance  Right    Next MD Visit  07/21/19    Prior Therapy  yes      PROM   Right Shoulder Flexion  128 Degrees    Right Shoulder External Rotation  64 Degrees   in scapular plane to tissue limits - no pain       Palpation   Palpation comment  muscular tightnes Rt shoulder girdle - pecs; deltoid; upper trap; leveator; teres;                   [facility] Adult PT Treatment/Exercise - 06/06/19 0001      Shoulder Exercises: Sidelying   External Rotation  AROM;Right;10 reps      Shoulder Exercises: Standing   Flexion  AROM;Right;5 reps   scapular control with noodle  Flexion Limitations  small reach forward maintaining scapular control     Other Standing Exercises  scapular retraction with noodle 10 sec x 10 reps     Other Standing Exercises  L's x 5 sec x 10 reps, shoulder rolls x 10 backwards.       Shoulder Exercises: Pulleys   Flexion  --   10 reps 10 sec hold    Scaption  --   10 sec hold x 10 reps      Shoulder Exercises: Stretch   Internal Rotation Stretch Limitations  gentle IR w/ strap pulling Rt UE to Lt across buttocks       Vasopneumatic   Number Minutes Vasopneumatic   15 minutes    Vasopnuematic Location   Shoulder   Rt   Vasopneumatic Pressure  Low    Vasopneumatic Temperature   34 deg       Manual Therapy   Manual therapy comments  pt supine/hooklying with bolster and sidelying     Soft tissue mobilization  STM to Rt upper trap; levator, rhomboid, mid thoracic paraspinals, Rt pec, scalenes and cervical paraspinals.   suboccipital release and gentle traction to neck.  pin and stretch to Rt upper tra; and scalenes.     Scapular Mobilization  Rt shoulder in all directions (pt in supine and  sidelying)     Passive ROM  Rt shoulder flexion; ER; IR(gentle)  in scapular plane     Manual Traction  Rt arm distraction             PT Education - 06/06/19 0829    Education Details  HEP    Person(s) Educated  Patient    Methods  Explanation;Demonstration;Tactile cues;Verbal cues;Handout    Comprehension  Verbalized understanding;Returned demonstration;Verbal cues required;Tactile cues required       PT Short Term Goals - 05/17/19 0845      PT SHORT TERM GOAL #1   Title  I with initial HEP (06/01/2019)    Time  4    Status  On-going    Target Date  06/01/19      PT SHORT TERM GOAL #2   Title  demo Rt shoulder PROM WFL ( 06/01/2019)    Time  4    Period  Weeks    Status  On-going    Target Date  06/01/19      PT SHORT TERM GOAL #3   Title  assess strength ( 06/01/2019)    Time  4    Period  Weeks    Status  On-going    Target Date  06/01/19      PT SHORT TERM GOAL #4   Title  I with bathing and dressing ( 06/01/2019)    Time  4    Period  Weeks    Status  Achieved    Target Date  06/01/19        PT Long Term Goals - 05/04/19 0844      PT LONG TERM GOAL #1   Title  Improve posture and alignment with patient to demonstrate improved upright posture with posterior shoulder girdle engaged ( 06/29/2019)    Time  8    Period  Weeks    Status  New    Target Date  06/29/19      PT LONG TERM GOAL #2   Title  Increased AROM Rt shoulder to =/> AROM Lt shoulder with minimal to no pain  ( 06/29/2019)    Time  8    Period  Weeks    Status  New    Target Date  06/29/19      PT LONG TERM GOAL #3   Title  dmeo Rt shoulder strength =/> 4+/5 to allow return to normal activity ( 06/29/2019)    Time  8    Period  Weeks    Status  New    Target Date  06/01/19      PT LONG TERM GOAL #4   Title  Independent in HEP ( 06/29/2019)    Time  8    Period  Weeks    Status  New    Target Date  06/29/19      PT LONG TERM GOAL #5   Title  Improve FOTO to </= 44% limited (  06/29/2019)    Time  8    Period  Weeks    Status  New    Target Date  06/29/19            Plan - 06/06/19 0813    Clinical Impression Statement  Continued pain on a constant basis - just aching. Patient has increased pain every morning - discussed modifications for sleeping positions. Added light resistive exercises per protocol. Continues to have muscular tightness to palpation and limited ROM    Rehab Potential  Excellent    PT Frequency  2x / week    PT Duration  8 weeks    PT Treatment/Interventions  Iontophoresis 4mg /ml Dexamethasone;Taping;Vasopneumatic Device;Patient/family education;Moist Heat;Ultrasound;Scar mobilization;Passive range of motion;Therapeutic exercise;Cryotherapy;Electrical Stimulation;Manual techniques;Dry needling    PT Next Visit Plan  continue progressive ROM and scapular strengthening within protocol; no shoulder extension/IR until 12 wks per protocol, manual work for tissue tightness and ROM; modalities for pain.    PT Home Exercise Plan  [email or web address]    Consulted and Agree with Plan of Care  Patient       Patient will benefit from skilled therapeutic intervention in order to improve the following deficits and impairments:     Visit Diagnosis: Acute pain of right shoulder  Stiffness of right shoulder, not elsewhere classified  Other symptoms and signs involving the musculoskeletal system  Abnormal posture     Problem List Patient Active Problem List   Diagnosis Date Noted  . Infection of prosthetic shoulder joint ([place]) 05/12/2019  . De Quervain's disease (tenosynovitis) 11/10/2018  . Shoulder pain, right 06/23/2018  . Lesion of mouth 12/23/2017  . Esophageal dysphagia 12/23/2017  . Mastocytosis 12/23/2017  . Vitiligo 12/23/2017  . Rosacea 12/23/2017  . Hypothyroidism 12/23/2017  . Anxiety and depression 12/23/2017    Daeshaun Specht Nilda Simmer PT, MPH  06/06/2019, 8:52 AM  [facility] [place]  [place], [place], [address] Phone: [phone]   Fax:  [phone]  Name: Kayla Lindsey MRN: [id number] Date of Birth: [birth year]

## 2019-06-08 ENCOUNTER — Other Ambulatory Visit: Payer: Self-pay

## 2019-06-08 ENCOUNTER — Encounter: Payer: Self-pay | Admitting: Rehabilitative and Restorative Service Providers"

## 2019-06-08 ENCOUNTER — Ambulatory Visit (INDEPENDENT_AMBULATORY_CARE_PROVIDER_SITE_OTHER): Payer: Managed Care, Other (non HMO) | Admitting: Rehabilitative and Restorative Service Providers"

## 2019-06-08 DIAGNOSIS — M25511 Pain in right shoulder: Secondary | ICD-10-CM | POA: Diagnosis not present

## 2019-06-08 DIAGNOSIS — R29898 Other symptoms and signs involving the musculoskeletal system: Secondary | ICD-10-CM | POA: Diagnosis not present

## 2019-06-08 DIAGNOSIS — M25611 Stiffness of right shoulder, not elsewhere classified: Secondary | ICD-10-CM | POA: Diagnosis not present

## 2019-06-08 DIAGNOSIS — R293 Abnormal posture: Secondary | ICD-10-CM | POA: Diagnosis not present

## 2019-06-08 NOTE — Therapy (Signed)
[place], [place], [address] Phone: [phone]   Fax:  [phone]  Physical Therapy Treatment  Patient Details  Name: Kayla Lindsey MRN: [id number] Date of Birth: Jul 11, 1965 Referring Provider (PT): Dr Ophelia Charter   Encounter Date: 06/08/2019  PT End of Session - 06/08/19 0848    Visit Number  10    Number of Visits  16    Date for PT Re-Evaluation  06/29/19    PT Start Time  0845    PT Stop Time  0939    PT Time Calculation (min)  54 min    Activity Tolerance  Patient tolerated treatment well       Past Medical History:  Diagnosis Date  . Anxiety   . Depression   . Hashimoto's thyroiditis    hypo  . Hypothyroidism   . Mastocytosis   . Osteoarthritis    right shoulder  . Rheumatoid arthritis ([place])    mgd by Dr Jillyn Ledger, Maralyn Sago   . Vitiligo     Past Surgical History:  Procedure Laterality Date  . APPENDECTOMY    . BREAST BIOPSY Bilateral   . BREAST LUMPECTOMY Right   . carpel tunnel Bilateral   . CHOLECYSTECTOMY    . HIP SURGERY Left 02/2007   laboral repair   . SHOULDER ACROMIOPLASTY Right   . TOTAL SHOULDER ARTHROPLASTY Right 04/20/2019   Procedure: TOTAL SHOULDER ARTHROPLASTY;  Surgeon: Hiram Gash, MD;  Location: [facility];  Service: Orthopedics;  Laterality: Right;    There were no vitals filed for this visit.  Subjective Assessment - 06/08/19 0849    Subjective  Still having difficulty sleeping - pillows on side did not help. She is sleeping in her back with a pillow under the Rt side.    Currently in Pain?  Yes    Pain Score  4     Pain Location  Shoulder    Pain Orientation  Right    Pain Descriptors / Indicators  Aching;Sore;Constant    Pain Type  Chronic pain    Pain Onset  More than a month ago    Pain Frequency  Constant                       [facility] Adult PT Treatment/Exercise - 06/08/19 0001      Shoulder Exercises:  Standing   Flexion  AROM;Right;5 reps   scapular control with noodle    Flexion Limitations  small reach forward maintaining scapular control     ABduction  AROM;Right;10 reps   scaption   Other Standing Exercises  scapular retraction with noodle 10 sec x 10 reps     Other Standing Exercises  L's x 5 sec x 10 reps, shoulder rolls x 10 backwards.       Shoulder Exercises: Pulleys   Flexion  --   10 reps 10 sec hold    Scaption  --   10 sec hold x 10 reps      Shoulder Exercises: Isometric Strengthening   External Rotation  5X5"    External Rotation Limitations  isometric step out for ER holding UE at 90/90 stepping to Rt 3 sec hold x 15       Shoulder Exercises: Stretch   Internal Rotation Stretch Limitations  gentle IR w/ strap pulling Rt UE to Lt across buttocks       Vasopneumatic   Number Minutes Vasopneumatic  15 minutes    Vasopnuematic Location   Shoulder   Rt   Vasopneumatic Pressure  Low    Vasopneumatic Temperature   34 deg       Manual Therapy   Manual therapy comments  pt supine/hooklying with bolster and sidelying     Soft tissue mobilization  STM to Rt upper trap; levator, rhomboid, mid thoracic paraspinals, Rt pec, scalenes and cervical paraspinals.   suboccipital release and gentle traction to neck.  pin and stretch to Rt upper tra; and scalenes.     Scapular Mobilization  Rt shoulder in all directions (pt in supine and sidelying)     Passive ROM  Rt shoulder flexion; ER; IR(gentle)  in scapular plane     Manual Traction  Rt arm distraction               PT Short Term Goals - 05/17/19 0845      PT SHORT TERM GOAL #1   Title  I with initial HEP (06/01/2019)    Time  4    Status  On-going    Target Date  06/01/19      PT SHORT TERM GOAL #2   Title  demo Rt shoulder PROM WFL ( 06/01/2019)    Time  4    Period  Weeks    Status  On-going    Target Date  06/01/19      PT SHORT TERM GOAL #3   Title  assess strength ( 06/01/2019)    Time  4     Period  Weeks    Status  On-going    Target Date  06/01/19      PT SHORT TERM GOAL #4   Title  I with bathing and dressing ( 06/01/2019)    Time  4    Period  Weeks    Status  Achieved    Target Date  06/01/19        PT Long Term Goals - 05/04/19 0844      PT LONG TERM GOAL #1   Title  Improve posture and alignment with patient to demonstrate improved upright posture with posterior shoulder girdle engaged ( 06/29/2019)    Time  8    Period  Weeks    Status  New    Target Date  06/29/19      PT LONG TERM GOAL #2   Title  Increased AROM Rt shoulder to =/> AROM Lt shoulder with minimal to no pain  ( 06/29/2019)    Time  8    Period  Weeks    Status  New    Target Date  06/29/19      PT LONG TERM GOAL #3   Title  dmeo Rt shoulder strength =/> 4+/5 to allow return to normal activity ( 06/29/2019)    Time  8    Period  Weeks    Status  New    Target Date  06/01/19      PT LONG TERM GOAL #4   Title  Independent in HEP ( 06/29/2019)    Time  8    Period  Weeks    Status  New    Target Date  06/29/19      PT LONG TERM GOAL #5   Title  Improve FOTO to </= 44% limited ( 06/29/2019)    Time  8    Period  Weeks    Status  New    Target Date  06/29/19  Plan - 06/08/19 0850    Clinical Impression Statement  Patient continues to have constant pain - aching discomfort. She continues to have difficulty sleeping. Muscular tightness persists. progressing with rehab per protocol.    Rehab Potential  Excellent    PT Frequency  2x / week    PT Duration  8 weeks    PT Treatment/Interventions  Iontophoresis 4mg /ml Dexamethasone;Taping;Vasopneumatic Device;Patient/family education;Moist Heat;Ultrasound;Scar mobilization;Passive range of motion;Therapeutic exercise;Cryotherapy;Electrical Stimulation;Manual techniques;Dry needling    PT Next Visit Plan  continue progressive ROM and scapular strengthening within protocol; no shoulder extension/IR until 12 wks per protocol,  manual work for tissue tightness and ROM; modalities for pain. Patient's husband may come in for instruction in massage for the shoulder    PT [place] and Agree with Plan of Care  Patient       Patient will benefit from skilled therapeutic intervention in order to improve the following deficits and impairments:     Visit Diagnosis: Acute pain of right shoulder  Stiffness of right shoulder, not elsewhere classified  Other symptoms and signs involving the musculoskeletal system  Abnormal posture     Problem List Patient Active Problem List   Diagnosis Date Noted  . 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 De Quervain's disease (tenosynovitis) 11/10/2018  . Shoulder pain, right 06/23/2018  . Lesion of mouth 12/23/2017  . Esophageal dysphagia 12/23/2017  . Mastocytosis 12/23/2017  . Vitiligo 12/23/2017  . Rosacea 12/23/2017  . Hypothyroidism 12/23/2017  . Anxiety and depression 12/23/2017    Cristol Engdahl Nilda Simmer PT, MPH  06/08/2019, 9:31 AM  [facility] [place], [place], [address] Phone: [phone]   Fax:  [phone]  Name: Kayla Lindsey MRN: [id number] Date of Birth: [birth year]

## 2019-06-13 ENCOUNTER — Encounter: Payer: Self-pay | Admitting: Physical Therapy

## 2019-06-13 ENCOUNTER — Other Ambulatory Visit (HOSPITAL_COMMUNITY)
Admit: 2019-06-13 | Discharge: 2019-06-13 | Disposition: A | Discharge status: Home / Self Care | Payer: Managed Care, Other (non HMO) | Source: Ambulatory Visit | Attending: Internal Medicine | Admitting: Internal Medicine

## 2019-06-13 ENCOUNTER — Other Ambulatory Visit: Payer: Self-pay

## 2019-06-13 ENCOUNTER — Ambulatory Visit (INDEPENDENT_AMBULATORY_CARE_PROVIDER_SITE_OTHER): Payer: Managed Care, Other (non HMO) | Admitting: Physical Therapy

## 2019-06-13 DIAGNOSIS — M25611 Stiffness of right shoulder, not elsewhere classified: Secondary | ICD-10-CM

## 2019-06-13 DIAGNOSIS — M654 Radial styloid tenosynovitis [de Quervain]: Secondary | ICD-10-CM | POA: Insufficient documentation

## 2019-06-13 DIAGNOSIS — R293 Abnormal posture: Secondary | ICD-10-CM

## 2019-06-13 DIAGNOSIS — R29898 Other symptoms and signs involving the musculoskeletal system: Secondary | ICD-10-CM

## 2019-06-13 DIAGNOSIS — X58XXXA Exposure to other specified factors, initial encounter: Secondary | ICD-10-CM | POA: Diagnosis not present

## 2019-06-13 DIAGNOSIS — T8459XA Infection and inflammatory reaction due to other internal joint prosthesis, initial encounter: Secondary | ICD-10-CM | POA: Diagnosis not present

## 2019-06-13 DIAGNOSIS — M25511 Pain in right shoulder: Secondary | ICD-10-CM | POA: Insufficient documentation

## 2019-06-13 LAB — BASIC METABOLIC PANEL
Anion gap: 10 (ref 5–15)
BUN: 18 mg/dL (ref 6–20)
CO2: 22 mmol/L (ref 22–32)
Calcium: 9.5 mg/dL (ref 8.9–10.3)
Chloride: 108 mmol/L (ref 98–111)
Creatinine, Ser: 0.68 mg/dL (ref 0.44–1.00)
GFR calc Af Amer: >60 mL/min (ref >60)
GFR calc non Af Amer: >60 mL/min (ref >60)
Glucose, Bld: 104 mg/dL — ABNORMAL HIGH (ref 70–99)
Potassium: 4.2 mmol/L (ref 3.5–5.1)
Sodium: 140 mmol/L (ref 135–145)

## 2019-06-13 LAB — C-REACTIVE PROTEIN: CRP: <0.5 mg/dL (ref <1.0)

## 2019-06-13 LAB — CBC
HCT: 39.2 % (ref 36.0–46.0)
Hemoglobin: 13.1 g/dL (ref 12.0–15.0)
MCH: 30.4 pg (ref 26.0–34.0)
MCHC: 33.4 g/dL (ref 30.0–36.0)
MCV: 91 fL (ref 80.0–100.0)
Platelets: 368 10*3/uL (ref 150–400)
RBC: 4.31 MIL/uL (ref 3.87–5.11)
RDW: 12.9 % (ref 11.5–15.5)
WBC: 7.1 10*3/uL (ref 4.0–10.5)
nRBC: 0 % (ref 0.0–0.2)

## 2019-06-13 LAB — SEDIMENTATION RATE: Sed Rate: 6 mm/hr (ref 0–22)

## 2019-06-13 NOTE — Therapy (Signed)
[place], [place], [address] Phone: [phone]   Fax:  [phone]  Physical Therapy Treatment  Patient Details  Name: Kayla Lindsey MRN: [id number] Date of Birth: 15-Feb-1966 Referring Provider (PT): Dr Ophelia Charter   Encounter Date: 06/13/2019  PT End of Session - 06/13/19 0847    Visit Number  11    Number of Visits  16    Date for PT Re-Evaluation  06/29/19    PT Start Time  0804    PT Stop Time  0852    PT Time Calculation (min)  48 min    Activity Tolerance  Patient tolerated treatment well       Past Medical History:  Diagnosis Date  . Anxiety   . Depression   . Hashimoto's thyroiditis    hypo  . Hypothyroidism   . Mastocytosis   . Osteoarthritis    right shoulder  . Rheumatoid arthritis ([place])    mgd by Dr Jillyn Ledger, Maralyn Sago   . Vitiligo     Past Surgical History:  Procedure Laterality Date  . APPENDECTOMY    . BREAST BIOPSY Bilateral   . BREAST LUMPECTOMY Right   . carpel tunnel Bilateral   . CHOLECYSTECTOMY    . HIP SURGERY Left 02/2007   laboral repair   . SHOULDER ACROMIOPLASTY Right   . TOTAL SHOULDER ARTHROPLASTY Right 04/20/2019   Procedure: TOTAL SHOULDER ARTHROPLASTY;  Surgeon: Hiram Gash, MD;  Location: [facility];  Service: Orthopedics;  Laterality: Right;    There were no vitals filed for this visit.  Subjective Assessment - 06/13/19 0811    Subjective  Pt reports she is still having difficult sleeping.  She has tried all sorts of positions.  She is still in a lot of pain; nothing is helping.    Currently in Pain?  Yes    Pain Score  6     Pain Location  Shoulder    Pain Orientation  Right    Pain Descriptors / Indicators  Aching;Constant;Sore    Aggravating Factors   sleeping, reaching with Rt arm    Pain Relieving Factors  massage, ice, rest.         [facility] PT Assessment - 06/13/19 0001      Assessment   Medical Diagnosis  Rt  total shoulder    Referring Provider (PT)  Dr Ophelia Charter    Onset Date/Surgical Date  04/20/19    Hand Dominance  Right    Next MD Visit  07/21/19    Prior Therapy  yes      AROM   AROM Assessment Site  Shoulder    Right/Left Shoulder  Right    Right Shoulder Extension  27 Degrees    Right Shoulder Flexion  121 Degrees    Right Shoulder ABduction  72 Degrees    Right Shoulder Horizontal ABduction  26 Degrees    Right Shoulder Horizontal  ADduction  65 Degrees      PROM   Right Shoulder Flexion  136 Degrees   AAROM in supine with cane     [facility] Adult PT Treatment/Exercise - 06/13/19 0001      Shoulder Exercises: Supine   Horizontal ABduction  AAROM;Right;10 reps   5 sec; and Horiz ADD x 10   Flexion  AAROM;Both;5 reps      Shoulder Exercises: Seated   Other Seated Exercises  W's x 5 sec x 5 reps  Shoulder Exercises: Sidelying   Other Sidelying Exercises  Lt sidelying, Rt side open book (slowly) x 5 reps      Shoulder Exercises: Standing   Other Standing Exercises  wall ladder RUE flexion x 5 reps       Shoulder Exercises: Pulleys   ABduction  --   10 reps, 10 sec hold     Vasopneumatic   Number Minutes Vasopneumatic   15 minutes    Vasopnuematic Location   Shoulder   Rt   Vasopneumatic Pressure  Medium    Vasopneumatic Temperature   34 deg       Manual Therapy   Soft tissue mobilization  STM to Rt pec, upper trap, levator              PT Education - 06/13/19 1013    Education Details  HEP - verbally added horiz abdct/add in supine with cane and open book.  Encouraged pt to complete self massage to shoulder with ball against wall.    Person(s) Educated  Patient    Methods  Explanation    Comprehension  Verbalized understanding;Returned demonstration       PT Short Term Goals - 06/13/19 0844      PT SHORT TERM GOAL #1   Title  I with initial HEP (06/01/2019)    Time  4    Status  Achieved    Target Date  06/01/19      PT SHORT TERM GOAL #2    Title  demo Rt shoulder PROM WFL ( 06/01/2019)    Time  4    Period  Weeks    Status  On-going    Target Date  06/01/19      PT SHORT TERM GOAL #3   Title  assess strength ( 06/01/2019)    Time  4    Period  Weeks    Status  On-going    Target Date  06/01/19      PT SHORT TERM GOAL #4   Title  I with bathing and dressing ( 06/01/2019)    Time  4    Period  Weeks    Status  Achieved    Target Date  06/01/19        PT Long Term Goals - 06/13/19 0843      PT LONG TERM GOAL #1   Title  Improve posture and alignment with patient to demonstrate improved upright posture with posterior shoulder girdle engaged ( 06/29/2019)    Time  8    Period  Weeks    Status  On-going      PT LONG TERM GOAL #2   Title  Increased AROM Rt shoulder to =/> AROM Lt shoulder with minimal to no pain  ( 06/29/2019)    Time  8    Period  Weeks    Status  On-going      PT LONG TERM GOAL #3   Title  dmeo Rt shoulder strength =/> 4+/5 to allow return to normal activity ( 06/29/2019)    Time  8    Period  Weeks    Status  On-going      PT LONG TERM GOAL #4   Title  Independent in HEP ( 06/29/2019)    Time  8    Period  Weeks    Status  On-going      PT LONG TERM GOAL #5   Title  Improve FOTO to </= 44% limited ( 06/29/2019)  Time  8    Period  Weeks    Status  On-going            Plan - 06/13/19 0845    Clinical Impression Statement  Pt has met STG#1.  Pt has difficulty tolerating horiz adduction.  Her Rt shoulder AROM has improved from last assessment.  Pt is progressing gradually towards STG/LTG.    Rehab Potential  Excellent    PT Frequency  2x / week    PT Duration  8 weeks    PT Treatment/Interventions  Iontophoresis '4mg'$ /ml Dexamethasone;Taping;Vasopneumatic Device;Patient/family education;Moist Heat;Ultrasound;Scar mobilization;Passive range of motion;Therapeutic exercise;Cryotherapy;Electrical Stimulation;Manual techniques;Dry needling    PT Next Visit Plan  continue progressive ROM  and scapular strengthening within protocol; no shoulder extension/IR until 12 wks per protocol, manual work for tissue tightness and ROM; modalities for pain. Patient's husband may come in for instruction in massage for the shoulder    PT [place] and Agree with Plan of Care  Patient       Patient will benefit from skilled therapeutic intervention in order to improve the following deficits and impairments:     Visit Diagnosis: Acute pain of right shoulder  Stiffness of right shoulder, not elsewhere classified  Other symptoms and signs involving the musculoskeletal system  Abnormal posture     Problem List Patient Active Problem List   Diagnosis Date Noted  . 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 De Quervain's disease (tenosynovitis) 11/10/2018  . Shoulder pain, right 06/23/2018  . Lesion of mouth 12/23/2017  . Esophageal dysphagia 12/23/2017  . Mastocytosis 12/23/2017  . Vitiligo 12/23/2017  . Rosacea 12/23/2017  . Hypothyroidism 12/23/2017  . Anxiety and depression 12/23/2017    Shelbie Hutching 06/13/2019, 10:14 AM  [facility] [place], [place], [address] Phone: [phone]   Fax:  [phone]  Name: Kayla Lindsey MRN: [id number] Date of Birth: 31-Dec-1965

## 2019-06-15 ENCOUNTER — Encounter: Payer: Self-pay | Admitting: Rehabilitative and Restorative Service Providers"

## 2019-06-15 ENCOUNTER — Other Ambulatory Visit: Payer: Self-pay

## 2019-06-15 ENCOUNTER — Ambulatory Visit (INDEPENDENT_AMBULATORY_CARE_PROVIDER_SITE_OTHER): Payer: Managed Care, Other (non HMO) | Admitting: Rehabilitative and Restorative Service Providers"

## 2019-06-15 DIAGNOSIS — M25511 Pain in right shoulder: Secondary | ICD-10-CM | POA: Diagnosis not present

## 2019-06-15 DIAGNOSIS — M25611 Stiffness of right shoulder, not elsewhere classified: Secondary | ICD-10-CM

## 2019-06-15 DIAGNOSIS — R29898 Other symptoms and signs involving the musculoskeletal system: Secondary | ICD-10-CM

## 2019-06-15 DIAGNOSIS — R293 Abnormal posture: Secondary | ICD-10-CM | POA: Diagnosis not present

## 2019-06-15 NOTE — Therapy (Signed)
[place], [place], [address] Phone: [phone]   Fax:  [phone]  Physical Therapy Treatment  Patient Details  Name: Kayla Lindsey MRN: [id number] Date of Birth: 02-Jun-1965 Referring Provider (PT): Dr Ophelia Charter   Encounter Date: 06/15/2019  PT End of Session - 06/15/19 0802    Visit Number  12    Number of Visits  16    Date for PT Re-Evaluation  06/29/19    PT Start Time  0800    PT Stop Time  0852    PT Time Calculation (min)  52 min    Activity Tolerance  Patient tolerated treatment well       Past Medical History:  Diagnosis Date  . Anxiety   . Depression   . Hashimoto's thyroiditis    hypo  . Hypothyroidism   . Mastocytosis   . Osteoarthritis    right shoulder  . Rheumatoid arthritis ([place])    mgd by Dr Jillyn Ledger, Maralyn Sago   . Vitiligo     Past Surgical History:  Procedure Laterality Date  . APPENDECTOMY    . BREAST BIOPSY Bilateral   . BREAST LUMPECTOMY Right   . carpel tunnel Bilateral   . CHOLECYSTECTOMY    . HIP SURGERY Left 02/2007   laboral repair   . SHOULDER ACROMIOPLASTY Right   . TOTAL SHOULDER ARTHROPLASTY Right 04/20/2019   Procedure: TOTAL SHOULDER ARTHROPLASTY;  Surgeon: Hiram Gash, MD;  Location: [facility];  Service: Orthopedics;  Laterality: Right;    There were no vitals filed for this visit.  Subjective Assessment - 06/15/19 0802    Subjective  Still not sleeping well. Can get to sleep but awakens with pain in the night and can't get comfortable. Husband Linna Hoff here to learn how to work with patient on some manual work and ROM for home.    Currently in Pain?  Yes    Pain Score  4     Pain Location  Shoulder    Pain Orientation  Right    Pain Descriptors / Indicators  Aching;Constant;Sore    Pain Type  Chronic pain    Pain Onset  More than a month ago    Pain Frequency  Constant                       [facility]  Adult PT Treatment/Exercise - 06/15/19 0001      Shoulder Exercises: Pulleys   Flexion  --   10 reps 10 sec hold    Scaption  --   10 reps x 10 sec      Vasopneumatic   Number Minutes Vasopneumatic   10 minutes    Vasopnuematic Location   Shoulder   Rt   Vasopneumatic Pressure  Medium    Vasopneumatic Temperature   34 deg       Manual Therapy   Manual Therapy  --   husband present for instruction/practice manual technoiques   Manual therapy comments  pt supine/hooklying with bolster and sidelying     Soft tissue mobilization  STM to Rt pec, upper trap, levator; anterior/middle deltoid; biceps     Scapular Mobilization  Rt shoulder in all directions (pt in supine and sidelying)     Passive ROM  Rt shoulder flexion; ER; IR(gentle)  in scapular plane     Manual Traction  Rt arm distraction with jiggle  PT Education - 06/15/19 [phone]    Education Details  working with patient's husband on manual techniques for home; suggested trial of TENS unit especially at night to help with sleep    Person(s) Educated  Patient;Spouse    Methods  Explanation;Demonstration;Tactile cues;Verbal cues    Comprehension  Verbalized understanding;Returned demonstration;Verbal cues required;Tactile cues required       PT Short Term Goals - 06/13/19 0844      PT SHORT TERM GOAL #1   Title  I with initial HEP (06/01/2019)    Time  4    Status  Achieved    Target Date  06/01/19      PT SHORT TERM GOAL #2   Title  demo Rt shoulder PROM WFL ( 06/01/2019)    Time  4    Period  Weeks    Status  On-going    Target Date  06/01/19      PT SHORT TERM GOAL #3   Title  assess strength ( 06/01/2019)    Time  4    Period  Weeks    Status  On-going    Target Date  06/01/19      PT SHORT TERM GOAL #4   Title  I with bathing and dressing ( 06/01/2019)    Time  4    Period  Weeks    Status  Achieved    Target Date  06/01/19        PT Long Term Goals - 06/13/19 0843      PT LONG TERM  GOAL #1   Title  Improve posture and alignment with patient to demonstrate improved upright posture with posterior shoulder girdle engaged ( 06/29/2019)    Time  8    Period  Weeks    Status  On-going      PT LONG TERM GOAL #2   Title  Increased AROM Rt shoulder to =/> AROM Lt shoulder with minimal to no pain  ( 06/29/2019)    Time  8    Period  Weeks    Status  On-going      PT LONG TERM GOAL #3   Title  dmeo Rt shoulder strength =/> 4+/5 to allow return to normal activity ( 06/29/2019)    Time  8    Period  Weeks    Status  On-going      PT LONG TERM GOAL #4   Title  Independent in HEP ( 06/29/2019)    Time  8    Period  Weeks    Status  On-going      PT LONG TERM GOAL #5   Title  Improve FOTO to </= 44% limited ( 06/29/2019)    Time  8    Period  Weeks    Status  On-going            Plan - 06/15/19 [id number]    Clinical Impression Statement  Working today with patient and her husband, Linna Hoff, on manual techniques for Rt shoulder girdle. Dan observed and practiced manual work with good comprehension of techniques. Suggested patient try the TENs unit for home to address muscule spasm - which continues to create pain and limits sleep. significant muscular tightness noted through Rt shoulder girdle - may benefit from trial of DN to release muscular tightness.    Rehab Potential  Excellent    PT Frequency  2x / week    PT Duration  8 weeks    PT Treatment/Interventions  Iontophoresis 4mg /ml Dexamethasone;Taping;Vasopneumatic Device;Patient/family education;Moist Heat;Ultrasound;Scar mobilization;Passive range of motion;Therapeutic exercise;Cryotherapy;Electrical Stimulation;Manual techniques;Dry needling    PT Next Visit Plan  continue progressive ROM and scapular strengthening within protocol; no shoulder extension/IR until 12 wks per protocol, manual work for tissue tightness and ROM; modalities for pain.    PT Home Exercise Plan  [id number]    Consulted and Agree with Plan of Care   Patient       Patient will benefit from skilled therapeutic intervention in order to improve the following deficits and impairments:     Visit Diagnosis: Acute pain of right shoulder  Stiffness of right shoulder, not elsewhere classified  Other symptoms and signs involving the musculoskeletal system  Abnormal posture     Problem List Patient Active Problem List   Diagnosis Date Noted  . 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 De Quervain's disease (tenosynovitis) 11/10/2018  . Shoulder pain, right 06/23/2018  . Lesion of mouth 12/23/2017  . Esophageal dysphagia 12/23/2017  . Mastocytosis 12/23/2017  . Vitiligo 12/23/2017  . Rosacea 12/23/2017  . Hypothyroidism 12/23/2017  . Anxiety and depression 12/23/2017    Sherriann Szuch Nilda Simmer PT, MPH  06/15/2019, 9:03 AM  [facility] [place], [place], [address] Phone: [phone]   Fax:  [phone]  Name: Kayla Lindsey MRN: [id number] Date of Birth: [birth year]

## 2019-06-20 ENCOUNTER — Ambulatory Visit (INDEPENDENT_AMBULATORY_CARE_PROVIDER_SITE_OTHER): Payer: Managed Care, Other (non HMO) | Admitting: Rehabilitative and Restorative Service Providers"

## 2019-06-20 ENCOUNTER — Encounter: Payer: Self-pay | Admitting: Rehabilitative and Restorative Service Providers"

## 2019-06-20 ENCOUNTER — Other Ambulatory Visit: Payer: Self-pay

## 2019-06-20 DIAGNOSIS — R293 Abnormal posture: Secondary | ICD-10-CM

## 2019-06-20 DIAGNOSIS — M25611 Stiffness of right shoulder, not elsewhere classified: Secondary | ICD-10-CM | POA: Diagnosis not present

## 2019-06-20 DIAGNOSIS — R29898 Other symptoms and signs involving the musculoskeletal system: Secondary | ICD-10-CM

## 2019-06-20 DIAGNOSIS — M25511 Pain in right shoulder: Secondary | ICD-10-CM | POA: Diagnosis not present

## 2019-06-20 NOTE — Therapy (Signed)
[place], [place], [address] Phone: [phone]   Fax:  [phone]  Physical Therapy Treatment  Patient Details  Name: Kayla Lindsey MRN: [id number] Date of Birth: 21-Oct-1965 Referring Provider (PT): Dr Ophelia Charter   Encounter Date: 06/20/2019  PT End of Session - 06/20/19 0801    Visit Number  13    Number of Visits  16    Date for PT Re-Evaluation  06/29/19    Authorization Type  [employer]    PT Start Time  0759    PT Stop Time  0850    PT Time Calculation (min)  51 min    Activity Tolerance  Patient tolerated treatment well       Past Medical History:  Diagnosis Date  . Anxiety   . Depression   . Hashimoto's thyroiditis    hypo  . Hypothyroidism   . Mastocytosis   . Osteoarthritis    right shoulder  . Rheumatoid arthritis ([place])    mgd by Dr Jillyn Ledger, Maralyn Sago   . Vitiligo     Past Surgical History:  Procedure Laterality Date  . APPENDECTOMY    . BREAST BIOPSY Bilateral   . BREAST LUMPECTOMY Right   . carpel tunnel Bilateral   . CHOLECYSTECTOMY    . HIP SURGERY Left 02/2007   laboral repair   . SHOULDER ACROMIOPLASTY Right   . TOTAL SHOULDER ARTHROPLASTY Right 04/20/2019   Procedure: TOTAL SHOULDER ARTHROPLASTY;  Surgeon: Hiram Gash, MD;  Location: [facility];  Service: Orthopedics;  Laterality: Right;    There were no vitals filed for this visit.  Subjective Assessment - 06/20/19 0801    Subjective  Patient reports that she is still not sleeping well. She awakens with pain - seems to be the deltoid.    Patient Stated Goals  use Rt arm again    Currently in Pain?  Yes    Pain Score  5     Pain Location  Shoulder    Pain Orientation  Right    Pain Descriptors / Indicators  Aching;Constant;Sore    Pain Type  Chronic pain    Pain Onset  More than a month ago    Pain Frequency  Constant                       [facility] Adult PT  Treatment/Exercise - 06/20/19 0001      Shoulder Exercises: Pulleys   Flexion  --   10 reps 10 sec hold    Scaption  --   10 reps x 10 sec      Moist Heat Therapy   Number Minutes Moist Heat  15 Minutes    Moist Heat Location  Shoulder;Cervical      Electrical Stimulation   Electrical Stimulation Location  Rt shoulder girdle     Electrical Stimulation Action  IFC    Electrical Stimulation Parameters  to tolerance    Electrical Stimulation Goals  Pain;Tone      Manual Therapy   Manual therapy comments  skilled palpation for DN; pt supine/hooklying with bolster and sidelying     Soft tissue mobilization  STM to Rt pec, upper trap, levator; anterior/middle deltoid; biceps     Scapular Mobilization  Rt shoulder in all directions (pt in supine and sidelying)     Passive ROM  Rt shoulder flexion; ER; IR(gentle)  in scapular plane  Trigger Point Dry Needling - 06/20/19 0001    Consent Given?  Yes    Education Handout Provided  Yes    Muscles Treated Head and Neck  Upper trapezius;Levator scapulae    Other Dry Needling  Rt only     Upper Trapezius Response  Palpable increased muscle length    Levator Scapulae Response  Palpable increased muscle length    Deltoid Response  Palpable increased muscle length           PT Education - 06/20/19 0844    Education Details  DN    Person(s) Educated  Patient    Methods  Explanation;Handout    Comprehension  Verbalized understanding       PT Short Term Goals - 06/13/19 0844      PT SHORT TERM GOAL #1   Title  I with initial HEP (06/01/2019)    Time  4    Status  Achieved    Target Date  06/01/19      PT SHORT TERM GOAL #2   Title  demo Rt shoulder PROM WFL ( 06/01/2019)    Time  4    Period  Weeks    Status  On-going    Target Date  06/01/19      PT SHORT TERM GOAL #3   Title  assess strength ( 06/01/2019)    Time  4    Period  Weeks    Status  On-going    Target Date  06/01/19      PT SHORT TERM GOAL #4   Title   I with bathing and dressing ( 06/01/2019)    Time  4    Period  Weeks    Status  Achieved    Target Date  06/01/19        PT Long Term Goals - 06/13/19 0843      PT LONG TERM GOAL #1   Title  Improve posture and alignment with patient to demonstrate improved upright posture with posterior shoulder girdle engaged ( 06/29/2019)    Time  8    Period  Weeks    Status  On-going      PT LONG TERM GOAL #2   Title  Increased AROM Rt shoulder to =/> AROM Lt shoulder with minimal to no pain  ( 06/29/2019)    Time  8    Period  Weeks    Status  On-going      PT LONG TERM GOAL #3   Title  dmeo Rt shoulder strength =/> 4+/5 to allow return to normal activity ( 06/29/2019)    Time  8    Period  Weeks    Status  On-going      PT LONG TERM GOAL #4   Title  Independent in HEP ( 06/29/2019)    Time  8    Period  Weeks    Status  On-going      PT LONG TERM GOAL #5   Title  Improve FOTO to </= 44% limited ( 06/29/2019)    Time  8    Period  Weeks    Status  On-going            Plan - 06/20/19 0846    Clinical Impression Statement  Patient continues to report Rt shoulder pain especially with sleeping. Tolerated DN well with palpable decrease in muscular tightness noted post treatment    Rehab Potential  Excellent    PT Frequency  2x /  week    PT Duration  8 weeks    PT Treatment/Interventions  Iontophoresis 4mg /ml Dexamethasone;Taping;Vasopneumatic Device;Patient/family education;Moist Heat;Ultrasound;Scar mobilization;Passive range of motion;Therapeutic exercise;Cryotherapy;Electrical Stimulation;Manual techniques;Dry needling    PT Next Visit Plan  assess response to DN - continue as indicated; continue progressive ROM and scapular strengthening within protocol; no shoulder extension/IR until 12 wks per protocol, manual work for tissue tightness and ROM; modalities for pain.    PT Home Exercise Plan  [id number]    Consulted and Agree with Plan of Care  Patient       Patient will  benefit from skilled therapeutic intervention in order to improve the following deficits and impairments:     Visit Diagnosis: Acute pain of right shoulder  Stiffness of right shoulder, not elsewhere classified  Other symptoms and signs involving the musculoskeletal system  Abnormal posture     Problem List Patient Active Problem List   Diagnosis Date Noted  . 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 De Quervain's disease (tenosynovitis) 11/10/2018  . Shoulder pain, right 06/23/2018  . Lesion of mouth 12/23/2017  . Esophageal dysphagia 12/23/2017  . Mastocytosis 12/23/2017  . Vitiligo 12/23/2017  . Rosacea 12/23/2017  . Hypothyroidism 12/23/2017  . Anxiety and depression 12/23/2017    Teola Felipe Nilda Simmer PT, MPH  06/20/2019, 8:48 AM  [facility] [place], [place], [address] Phone: [phone]   Fax:  [phone]  Name: Kayla Lindsey MRN: [id number] Date of Birth: [birth year]

## 2019-06-20 NOTE — Patient Instructions (Signed)

## 2019-06-21 ENCOUNTER — Other Ambulatory Visit (HOSPITAL_COMMUNITY)
Admit: 2019-06-21 | Discharge: 2019-06-21 | Disposition: A | Discharge status: Home / Self Care | Payer: Managed Care, Other (non HMO) | Source: Ambulatory Visit | Attending: Internal Medicine | Admitting: Internal Medicine

## 2019-06-21 DIAGNOSIS — M25511 Pain in right shoulder: Secondary | ICD-10-CM | POA: Insufficient documentation

## 2019-06-21 DIAGNOSIS — T8459XA Infection and inflammatory reaction due to other internal joint prosthesis, initial encounter: Secondary | ICD-10-CM | POA: Insufficient documentation

## 2019-06-21 DIAGNOSIS — M654 Radial styloid tenosynovitis [de Quervain]: Secondary | ICD-10-CM | POA: Insufficient documentation

## 2019-06-21 LAB — BASIC METABOLIC PANEL
Anion gap: 9 (ref 5–15)
BUN: 20 mg/dL (ref 6–20)
CO2: 21 mmol/L — ABNORMAL LOW (ref 22–32)
Calcium: 9.1 mg/dL (ref 8.9–10.3)
Chloride: 110 mmol/L (ref 98–111)
Creatinine, Ser: 0.84 mg/dL (ref 0.44–1.00)
GFR calc Af Amer: >60 mL/min (ref >60)
GFR calc non Af Amer: >60 mL/min (ref >60)
Glucose, Bld: 97 mg/dL (ref 70–99)
Potassium: 3.9 mmol/L (ref 3.5–5.1)
Sodium: 140 mmol/L (ref 135–145)

## 2019-06-21 LAB — CBC
HCT: 37.3 % (ref 36.0–46.0)
Hemoglobin: 12.3 g/dL (ref 12.0–15.0)
MCH: 30.1 pg (ref 26.0–34.0)
MCHC: 33 g/dL (ref 30.0–36.0)
MCV: 91.4 fL (ref 80.0–100.0)
Platelets: 345 10*3/uL (ref 150–400)
RBC: 4.08 MIL/uL (ref 3.87–5.11)
RDW: 12.8 % (ref 11.5–15.5)
WBC: 5.6 10*3/uL (ref 4.0–10.5)
nRBC: 0 % (ref 0.0–0.2)

## 2019-06-21 LAB — SEDIMENTATION RATE: Sed Rate: 7 mm/hr (ref 0–22)

## 2019-06-21 LAB — C-REACTIVE PROTEIN: CRP: 0.6 mg/dL (ref <1.0)

## 2019-06-23 ENCOUNTER — Encounter: Payer: Self-pay | Admitting: Rehabilitative and Restorative Service Providers"

## 2019-06-23 ENCOUNTER — Other Ambulatory Visit: Payer: Self-pay

## 2019-06-23 ENCOUNTER — Telehealth: Payer: Self-pay

## 2019-06-23 ENCOUNTER — Ambulatory Visit (INDEPENDENT_AMBULATORY_CARE_PROVIDER_SITE_OTHER): Payer: Managed Care, Other (non HMO) | Admitting: Rehabilitative and Restorative Service Providers"

## 2019-06-23 DIAGNOSIS — R293 Abnormal posture: Secondary | ICD-10-CM | POA: Diagnosis not present

## 2019-06-23 DIAGNOSIS — M25611 Stiffness of right shoulder, not elsewhere classified: Secondary | ICD-10-CM | POA: Diagnosis not present

## 2019-06-23 DIAGNOSIS — R29898 Other symptoms and signs involving the musculoskeletal system: Secondary | ICD-10-CM | POA: Diagnosis not present

## 2019-06-23 DIAGNOSIS — M25511 Pain in right shoulder: Secondary | ICD-10-CM | POA: Diagnosis not present

## 2019-06-23 NOTE — Telephone Encounter (Signed)
COVID-19 Pre-Screening Questions:06/23/19  Do you currently have a fever (>100 F), chills or unexplained body aches? NO  Are you currently experiencing new cough, shortness of breath, sore throat, runny nose? NO  .  Have you recently travelled outside the state of  in the last 14 days? NO  .  Have you been in contact with someone that is currently pending confirmation of Covid19 testing or has been confirmed to have the Covid19 virus?  NO  **If the patient answers NO to ALL questions -  advise the patient to please call the clinic before coming to the office should any symptoms develop.     

## 2019-06-23 NOTE — Therapy (Signed)
[place], [place], [address] Phone: [phone]   Fax:  [phone]  Physical Therapy Treatment  Patient Details  Name: Kayla Lindsey MRN: [id number] Date of Birth: Mar 24, 1965 Referring Provider (PT): Dr Ophelia Charter   Encounter Date: 06/23/2019  PT End of Session - 06/23/19 0807    Visit Number  14    Number of Visits  16    Date for PT Re-Evaluation  06/29/19    PT Start Time  0805    PT Stop Time  [id number]   MH end of treatment   PT Time Calculation (min)  47 min    Activity Tolerance  Patient tolerated treatment well       Past Medical History:  Diagnosis Date  . Anxiety   . Depression   . Hashimoto's thyroiditis    hypo  . Hypothyroidism   . Mastocytosis   . Osteoarthritis    right shoulder  . Rheumatoid arthritis ([place])    mgd by Dr Jillyn Ledger, Maralyn Sago   . Vitiligo     Past Surgical History:  Procedure Laterality Date  . APPENDECTOMY    . BREAST BIOPSY Bilateral   . BREAST LUMPECTOMY Right   . carpel tunnel Bilateral   . CHOLECYSTECTOMY    . HIP SURGERY Left 02/2007   laboral repair   . SHOULDER ACROMIOPLASTY Right   . TOTAL SHOULDER ARTHROPLASTY Right 04/20/2019   Procedure: TOTAL SHOULDER ARTHROPLASTY;  Surgeon: Hiram Gash, MD;  Location: [facility];  Service: Orthopedics;  Laterality: Right;    There were no vitals filed for this visit.  Subjective Assessment - 06/23/19 0808    Subjective  DN made her really sore the day of the needling but can tell she is looser - especially in the neck area. Still not sleeping due to pain. Horrible night    Currently in Pain?  Yes    Pain Score  6     Pain Location  Shoulder    Pain Orientation  Right    Pain Descriptors / Indicators  Aching;Constant    Pain Type  Chronic pain                       [facility] Adult PT Treatment/Exercise - 06/23/19 0001      Shoulder Exercises: Pulleys   Flexion   --   10 reps 10 sec hold    Scaption  --   10 reps x 10 sec      Shoulder Exercises: Isometric Strengthening   Other Isometric Exercises  standing - holding yellow TB in row position and stepping back 5 sec hold x 15 reps       Moist Heat Therapy   Number Minutes Moist Heat  15 Minutes    Moist Heat Location  Shoulder;Cervical      Manual Therapy   Manual therapy comments  skilled palpation for DN; pt supine/hooklying with bolster and sidelying     Soft tissue mobilization  STM to Rt teres/lats; pec, upper trap, levator; anterior/middle deltoid; biceps     Scapular Mobilization  Rt shoulder in all directions (pt in supine and sidelying)     Passive ROM  Rt shoulder flexion; scaption    Manual Traction  Rt arm distraction with jiggle        Trigger Point Dry Needling - 06/23/19 0001    Consent Given?  Yes    Education Handout Provided  Previously provided    Rhomboids Response  Palpable increased muscle length    Deltoid Response  Palpable increased muscle length    Teres major Response  Palpable increased muscle length    Teres minor Response  Palpable increased muscle length             PT Short Term Goals - 06/13/19 0844      PT SHORT TERM GOAL #1   Title  I with initial HEP (06/01/2019)    Time  4    Status  Achieved    Target Date  06/01/19      PT SHORT TERM GOAL #2   Title  demo Rt shoulder PROM WFL ( 06/01/2019)    Time  4    Period  Weeks    Status  On-going    Target Date  06/01/19      PT SHORT TERM GOAL #3   Title  assess strength ( 06/01/2019)    Time  4    Period  Weeks    Status  On-going    Target Date  06/01/19      PT SHORT TERM GOAL #4   Title  I with bathing and dressing ( 06/01/2019)    Time  4    Period  Weeks    Status  Achieved    Target Date  06/01/19        PT Long Term Goals - 06/13/19 0843      PT LONG TERM GOAL #1   Title  Improve posture and alignment with patient to demonstrate improved upright posture with posterior  shoulder girdle engaged ( 06/29/2019)    Time  8    Period  Weeks    Status  On-going      PT LONG TERM GOAL #2   Title  Increased AROM Rt shoulder to =/> AROM Lt shoulder with minimal to no pain  ( 06/29/2019)    Time  8    Period  Weeks    Status  On-going      PT LONG TERM GOAL #3   Title  dmeo Rt shoulder strength =/> 4+/5 to allow return to normal activity ( 06/29/2019)    Time  8    Period  Weeks    Status  On-going      PT LONG TERM GOAL #4   Title  Independent in HEP ( 06/29/2019)    Time  8    Period  Weeks    Status  On-going      PT LONG TERM GOAL #5   Title  Improve FOTO to </= 44% limited ( 06/29/2019)    Time  8    Period  Weeks    Status  On-going            Plan - 06/23/19 0848    Clinical Impression Statement  Some soreness following DN at last visit but then felt better. Tolerated DN to teres and leveator well today. Discussed modifications in activities at home.    Clinical Decision Making  Low    Rehab Potential  Excellent    PT Frequency  2x / week    PT Duration  8 weeks    PT Treatment/Interventions  Iontophoresis 4mg /ml Dexamethasone;Taping;Vasopneumatic Device;Patient/family education;Moist Heat;Ultrasound;Scar mobilization;Passive range of motion;Therapeutic exercise;Cryotherapy;Electrical Stimulation;Manual techniques;Dry needling    PT Next Visit Plan  assess response to DN - continue as indicated; continue progressive ROM and scapular strengthening within protocol; no shoulder extension/IR until  12 wks per protocol, manual work for tissue tightness and ROM; modalities for pain.    PT Home Exercise Plan  [id number]    Consulted and Agree with Plan of Care  Patient       Patient will benefit from skilled therapeutic intervention in order to improve the following deficits and impairments:     Visit Diagnosis: Acute pain of right shoulder  Stiffness of right shoulder, not elsewhere classified  Other symptoms and signs involving the  musculoskeletal system  Abnormal posture     Problem List Patient Active Problem List   Diagnosis Date Noted  . 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 De Quervain's disease (tenosynovitis) 11/10/2018  . Shoulder pain, right 06/23/2018  . Lesion of mouth 12/23/2017  . Esophageal dysphagia 12/23/2017  . Mastocytosis 12/23/2017  . Vitiligo 12/23/2017  . Rosacea 12/23/2017  . Hypothyroidism 12/23/2017  . Anxiety and depression 12/23/2017    Marilouise Densmore Nilda Simmer PT, MPH 06/23/2019, 8:51 AM  [facility] [place], [place], [address] Phone: [phone]   Fax:  [phone]  Name: Kayla Lindsey MRN: [id number] Date of Birth: 1966/01/25

## 2019-06-26 ENCOUNTER — Ambulatory Visit (INDEPENDENT_AMBULATORY_CARE_PROVIDER_SITE_OTHER): Payer: Managed Care, Other (non HMO) | Admitting: Family

## 2019-06-26 ENCOUNTER — Other Ambulatory Visit: Payer: Self-pay

## 2019-06-26 ENCOUNTER — Telehealth: Payer: Self-pay | Admitting: *Deleted

## 2019-06-26 ENCOUNTER — Encounter: Payer: Self-pay | Admitting: Family

## 2019-06-26 VITALS — BP 126/89 | HR 80 | Temp 97.6°F | Ht 67.0 in | Wt 195.2 lb

## 2019-06-26 DIAGNOSIS — Z96619 Presence of unspecified artificial shoulder joint: Secondary | ICD-10-CM

## 2019-06-26 DIAGNOSIS — T8459XD Infection and inflammatory reaction due to other internal joint prosthesis, subsequent encounter: Secondary | ICD-10-CM | POA: Diagnosis not present

## 2019-06-26 MED ORDER — DOXYCYCLINE HYCLATE 100 MG PO CAPS: 100.0000 mg | ORAL_CAPSULE | Freq: Two times a day (BID) | ORAL | 2 refills | Status: DC

## 2019-06-26 NOTE — Assessment & Plan Note (Addendum)
Ms. Kayla Lindsey has completed 6 weeks of IV therapy with ceftriaxone for P Acnes prosthetic joint infection without complications. Inflammatory markers have remained within normal limits. D/C PICC line today. Change ceftriaxone to doxycycline given mastocytosis with amoxicillin. Will need 3 months of doxycycline going forward. Plan of care discussed. Plan for follow up in 6 weeks or sooner if needed.

## 2019-06-26 NOTE — Patient Instructions (Signed)
Nice to see you.  We will get your PICC line removed.  A prescription for doxycyline will be sent to the pharmacy.   Plan for follow up in 6 weeks or sooner if needed.  Have a great day and stay safe!

## 2019-06-26 NOTE — Progress Notes (Signed)
Per verbal order from Terri Piedra, FNP, 42 cm Single Lumen Peripherally Inserted Central Catheter removed from left basilic, tip intact. No sutures present. RN confirmed length per chart. Dressing was clean and dry. Petroleum dressing applied. Pt advised no heavy lifting with this arm, leave dressing for 24 hours and call the office or seek emergent care if dressing becomes soaked with blood or sharp pain presents. Patient verbalized understanding and agreement.  Patient's questions answered to their satisfaction. Patient tolerated procedure well, RN walked patient to check out. Notified [facility]. Landis Gandy, RN

## 2019-06-26 NOTE — Progress Notes (Signed)
Subjective:    Patient ID: Kayla Lindsey, female    DOB: 02-08-1966, 54 y.o.   MRN: [id number]  Chief Complaint  Patient presents with  . Follow-up    6 week follow up     HPI:  Kayla Lindsey is a 54 y.o. 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   Kayla Lindsey has been receiving her ceftriaxone as prescribed with no adverse side effects. Does have right shoulder pain but otherwise feeling well with no fevers, chills or sweats. PICC line functioning without signs of infection. Ready to have PICC line removed.    Allergies  Allergen Reactions  . Tape Rash    Redness      Outpatient Medications Prior to Visit  Medication Sig Dispense Refill  . acetaminophen (TYLENOL) 500 MG tablet Take 1,000 mg by mouth every 6 (six) hours as needed for moderate pain or headache.    . albuterol (VENTOLIN HFA) 108 (90 Base) MCG/ACT inhaler Inhale 2 puffs into the lungs every 4 (four) hours as needed for wheezing or shortness of breath.    . Azelaic Acid 15 % cream Apply 1 application topically 2 (two) times daily. After skin is thoroughly washed and patted dry 50 g 11  . Biotin 10000 MCG TABS Take 10,000 mcg by mouth daily.     Marland Kitchen buPROPion (WELLBUTRIN) 75 MG tablet Take 75 mg by mouth daily.     . cetirizine (ZYRTEC) 10 MG tablet Take 10 mg by mouth daily.    Marland Kitchen EPINEPHrine (EPIPEN 2-PAK) 0.3 mg/0.3 mL IJ SOAJ injection Inject 0.3 mg into the muscle as needed for anaphylaxis.    . folic acid (FOLVITE) Q000111Q MCG tablet Take 800 mcg by mouth daily.     . hydroxychloroquine (PLAQUENIL) 200 MG tablet Take 200 mg by mouth 2 (two) times daily.     Marland Kitchen levothyroxine (SYNTHROID) 125 MCG tablet Take 125 mcg by mouth daily.    . meloxicam (MOBIC) 7.5 MG tablet Take 1 tablet (7.5 mg total) by mouth daily. 30 tablet 2  . predniSONE (DELTASONE) 5 MG tablet Take 5 mg by mouth  daily.    . Sulfacetamide Sodium 10 % LIQD APPLY TO FACE TOPICALLY TWICE A DAY AS FACE [place]. (Patient taking differently: Apply 1 application topically in the morning and at bedtime. ) 177 mL 1  . traMADol (ULTRAM) 50 MG tablet Take 50 mg by mouth every 6 (six) hours as needed for moderate pain.     Lorrin Mais 5 MG tablet Take 5 mg by mouth at bedtime as needed for sleep.     Marland Kitchen levothyroxine (SYNTHROID) 112 MCG tablet Take 1 tablet (112 mcg total) by mouth daily before breakfast. (Patient not taking: Reported on 05/15/2019) 90 tablet 0  . venlafaxine XR (EFFEXOR-XR) 150 MG 24 hr capsule Take 1 capsule (150 mg total) by mouth daily with breakfast. 90 capsule 1  . doxycycline (VIBRAMYCIN) 100 MG capsule Take 100 mg by mouth 2 (two) times daily.    . Phentermine-Topiramate (QSYMIA) 3.75-23 MG CP24 Take 1 tablet by mouth daily.    . predniSONE (DELTASONE) 50 MG tablet Take 1 tablet (50 mg total) by mouth daily. (Patient not taking: Reported on 05/15/2019) 5 tablet 0   No facility-administered medications prior to visit.     Past Medical History:  Diagnosis Date  . Anxiety   .  Depression   . Hashimoto's thyroiditis    hypo  . Hypothyroidism   . Mastocytosis   . Osteoarthritis    right shoulder  . Rheumatoid arthritis ([place])    mgd by Dr Jillyn Ledger, Maralyn Sago   . Vitiligo      Past Surgical History:  Procedure Laterality Date  . APPENDECTOMY    . BREAST BIOPSY Bilateral   . BREAST LUMPECTOMY Right   . carpel tunnel Bilateral   . CHOLECYSTECTOMY    . HIP SURGERY Left 02/2007   laboral repair   . SHOULDER ACROMIOPLASTY Right   . TOTAL SHOULDER ARTHROPLASTY Right 04/20/2019   Procedure: TOTAL SHOULDER ARTHROPLASTY;  Surgeon: Hiram Gash, MD;  Location: [facility];  Service: Orthopedics;  Laterality: Right;       Review of Systems  Constitutional: Negative for chills, diaphoresis, fatigue and fever.  Respiratory: Negative for cough, chest tightness, shortness of breath  and wheezing.   Cardiovascular: Negative for chest pain.  Gastrointestinal: Negative for abdominal pain, diarrhea, nausea and vomiting.  Musculoskeletal:       Positive for right shoulder pain      Objective:    BP 126/89 (BP Location: Right Arm)   Pulse 80   Temp 97.6 F (36.4 C)   Ht 5\' 7"  (1.702 m)   Wt 195 lb 3.2 oz (88.5 kg)   SpO2 100%   BMI 30.57 kg/m  Nursing note and vital signs reviewed.  Physical Exam Constitutional:      General: She is not in acute distress.    Appearance: She is well-developed.  Cardiovascular:     Rate and Rhythm: Normal rate and regular rhythm.     Heart sounds: Normal heart sounds.  Pulmonary:     Effort: Pulmonary effort is normal.     Breath sounds: Normal breath sounds.  Skin:    General: Skin is warm and dry.  Neurological:     Mental Status: She is alert and oriented to person, place, and time.  Psychiatric:        Mood and Affect: Mood normal.      Depression screen [place] 2/9 05/12/2019 09/13/2018 01/20/2018 12/23/2017  Decreased Interest 0 1 3 3   Down, Depressed, Hopeless 0 2 2 3   PHQ - 2 Score 0 3 5 6   Altered sleeping - 2 2 3   Tired, decreased energy - 3 3 3   Change in appetite - 2 0 0  Feeling bad or failure about yourself  - 2 2 3   Trouble concentrating - 3 3 3   Moving slowly or fidgety/restless - 1 0 2  Suicidal thoughts - 1 0 0  PHQ-9 Score - 17 15 20   Difficult doing work/chores - Somewhat difficult Very difficult Very difficult       Assessment & Plan:    Patient Active Problem List   Diagnosis Date Noted  . Infection of prosthetic shoulder joint ([place]) 05/12/2019  . De Quervain's disease (tenosynovitis) 11/10/2018  . Shoulder pain, right 06/23/2018  . Lesion of mouth 12/23/2017  . Esophageal dysphagia 12/23/2017  . Mastocytosis 12/23/2017  . Vitiligo 12/23/2017  . Rosacea 12/23/2017  . Hypothyroidism 12/23/2017  . Anxiety and depression 12/23/2017     Problem List Items Addressed This Visit       Musculoskeletal and Integument   Infection of prosthetic shoulder joint ([place]) - Primary    Kayla Lindsey has completed 6 weeks of IV therapy with ceftriaxone for P Acnes prosthetic joint infection without complications. Inflammatory  markers have remained within normal limits. D/C PICC line today. Change ceftriaxone to doxycycline given mastocytosis with amoxicillin. Will need 3 months of doxycycline going forward. Plan of care discussed. Plan for follow up in 6 weeks or sooner if needed.       Relevant Orders   PICC line removal       I have discontinued [place]. [place] "Kim"'s Qsymia. I have also changed her doxycycline. Additionally, I am having her maintain her venlafaxine XR, levothyroxine, Azelaic Acid, Sulfacetamide Sodium, hydroxychloroquine, buPROPion, folic acid, Biotin, meloxicam, traMADol, levothyroxine, Ambien, predniSONE, cetirizine, EPINEPHrine, albuterol, and acetaminophen.   Meds ordered this encounter  Medications  . doxycycline (VIBRAMYCIN) 100 MG capsule    Sig: Take 1 capsule (100 mg total) by mouth 2 (two) times daily.    Dispense:  60 capsule    Refill:  2    Order Specific Question:   Supervising Provider    Answer:   Carlyle Basques [[id number]]     Follow-up: Return in about 6 weeks (around 08/07/2019), or if symptoms worsen or fail to improve.   Terri Piedra, MSN, FNP-C Nurse Practitioner [facility] [place] number: [phone]

## 2019-06-26 NOTE — Telephone Encounter (Signed)
RN notified Debbie at [facility] that the patient's PICC was pulled during office visit. Landis Gandy, RN

## 2019-06-27 ENCOUNTER — Ambulatory Visit (INDEPENDENT_AMBULATORY_CARE_PROVIDER_SITE_OTHER): Payer: Managed Care, Other (non HMO) | Admitting: Physical Therapy

## 2019-06-27 ENCOUNTER — Encounter: Payer: Self-pay | Admitting: Physical Therapy

## 2019-06-27 DIAGNOSIS — R293 Abnormal posture: Secondary | ICD-10-CM | POA: Diagnosis not present

## 2019-06-27 DIAGNOSIS — R29898 Other symptoms and signs involving the musculoskeletal system: Secondary | ICD-10-CM

## 2019-06-27 DIAGNOSIS — M25611 Stiffness of right shoulder, not elsewhere classified: Secondary | ICD-10-CM | POA: Diagnosis not present

## 2019-06-27 DIAGNOSIS — M25511 Pain in right shoulder: Secondary | ICD-10-CM | POA: Diagnosis not present

## 2019-06-27 NOTE — Therapy (Signed)
[place], [place], [address] Phone: [phone]   Fax:  [phone]  Physical Therapy Treatment  Patient Details  Name: Kayla Lindsey MRN: [id number] Date of Birth: 1965-03-27 Referring Provider (PT): Dr Ophelia Charter   Encounter Date: 06/27/2019  PT End of Session - 06/27/19 0806    Visit Number  15    Number of Visits  16    Date for PT Re-Evaluation  06/29/19    PT Start Time  0802    PT Stop Time  0849    PT Time Calculation (min)  47 min    Activity Tolerance  Patient tolerated treatment well       Past Medical History:  Diagnosis Date  . Anxiety   . Depression   . Hashimoto's thyroiditis    hypo  . Hypothyroidism   . Mastocytosis   . Osteoarthritis    right shoulder  . Rheumatoid arthritis ([place])    mgd by Dr Jillyn Ledger, Maralyn Sago   . Vitiligo     Past Surgical History:  Procedure Laterality Date  . APPENDECTOMY    . BREAST BIOPSY Bilateral   . BREAST LUMPECTOMY Right   . carpel tunnel Bilateral   . CHOLECYSTECTOMY    . HIP SURGERY Left 02/2007   laboral repair   . SHOULDER ACROMIOPLASTY Right   . TOTAL SHOULDER ARTHROPLASTY Right 04/20/2019   Procedure: TOTAL SHOULDER ARTHROPLASTY;  Surgeon: Hiram Gash, MD;  Location: [facility];  Service: Orthopedics;  Laterality: Right;    There were no vitals filed for this visit.  Subjective Assessment - 06/27/19 0807    Subjective  Pt reports the DN has helped tremendously. She has been able to sleep the last 3 nights. She had her pic line removed yesterday and she is not to lift anything with LUE for another day.    Currently in Pain?  Yes    Pain Score  3     Pain Location  Shoulder    Pain Orientation  Right, ant/post, proximal    Pain Descriptors / Indicators  Aching         [facility] PT Assessment - 06/27/19 0001      Assessment   Medical Diagnosis  Rt total shoulder    Referring Provider (PT)  Dr Ophelia Charter    Onset Date/Surgical Date  04/20/19    Hand Dominance  Right    Next MD Visit  07/21/19    Prior Therapy  yes      [facility] Adult PT Treatment/Exercise - 06/27/19 0001      Shoulder Exercises: Prone   Other Prone Exercises  attempted prone position with/without towel roll at prox shoulder - unable to tolerate; reported spasming in ant shoulder afterwards.       Shoulder Exercises: Sidelying   Flexion  AROM;AAROM;Right;5 reps;PROM    Flexion Limitations  slight traction into flexion for stretch to teres major/lat    ABduction  AROM;AAROM;Right;10 reps      Shoulder Exercises: Pulleys   Flexion  --   10 reps 10 sec hold    Scaption  --   10 reps x 10 sec      Shoulder Exercises: Stretch   Other Shoulder Stretches  gentle doorway stretch with arms at 90 deg x 10 sec x 3 reps.  Shoulder rolls x 10 CW/CCW     Vasopneumatic   Number Minutes Vasopneumatic   10 minutes  Vasopnuematic Location   Shoulder   Rt   Vasopneumatic Pressure  Medium    Vasopneumatic Temperature   34 deg       Manual Therapy   Soft tissue mobilization  STM to Rt bicep, deltoid, levator, upper trap, rhomboid    Scapular Mobilization  Rt shoulder in all directions (pt sidelying)     Manual Traction  Rt arm distraction with jiggle                PT Short Term Goals - 06/13/19 0844      PT SHORT TERM GOAL #1   Title  I with initial HEP (06/01/2019)    Time  4    Status  Achieved    Target Date  06/01/19      PT SHORT TERM GOAL #2   Title  demo Rt shoulder PROM WFL ( 06/01/2019)    Time  4    Period  Weeks    Status  On-going    Target Date  06/01/19      PT SHORT TERM GOAL #3   Title  assess strength ( 06/01/2019)    Time  4    Period  Weeks    Status  On-going    Target Date  06/01/19      PT SHORT TERM GOAL #4   Title  I with bathing and dressing ( 06/01/2019)    Time  4    Period  Weeks    Status  Achieved    Target Date  06/01/19        PT Long Term Goals - 06/13/19  0843      PT LONG TERM GOAL #1   Title  Improve posture and alignment with patient to demonstrate improved upright posture with posterior shoulder girdle engaged ( 06/29/2019)    Time  8    Period  Weeks    Status  On-going      PT LONG TERM GOAL #2   Title  Increased AROM Rt shoulder to =/> AROM Lt shoulder with minimal to no pain  ( 06/29/2019)    Time  8    Period  Weeks    Status  On-going      PT LONG TERM GOAL #3   Title  dmeo Rt shoulder strength =/> 4+/5 to allow return to normal activity ( 06/29/2019)    Time  8    Period  Weeks    Status  On-going      PT LONG TERM GOAL #4   Title  Independent in HEP ( 06/29/2019)    Time  8    Period  Weeks    Status  On-going      PT LONG TERM GOAL #5   Title  Improve FOTO to </= 44% limited ( 06/29/2019)    Time  8    Period  Weeks    Status  On-going            Plan - 06/27/19 1011    Clinical Impression Statement  Continued positive response to DN in last few sessions.  Pt now sleeping through the night now, due to lowered pain levels. Pt is unable to tolerate prone position due to increased pain in ant shoulder; this caused spasming in bicep afterwards.  Spasming resolved with manual work in [place]. Pt continues to make good gains within rehab protocol.    Rehab Potential  Excellent    PT Frequency  2x / week  PT Duration  8 weeks    PT Treatment/Interventions  Iontophoresis 4mg /ml Dexamethasone;Taping;Vasopneumatic Device;Patient/family education;Moist Heat;Ultrasound;Scar mobilization;Passive range of motion;Therapeutic exercise;Cryotherapy;Electrical Stimulation;Manual techniques;Dry needling    PT Next Visit Plan  Assess goals and need for additional visits.  End of POC.  no resisted shoulder extension/IR until 12 wks per protocol.    PT Home Exercise Plan  [id number]    Consulted and Agree with Plan of Care  Patient       Patient will benefit from skilled therapeutic intervention in order to improve the following  deficits and impairments:  Decreased range of motion, Impaired UE functional use, Increased muscle spasms, Pain, Increased edema, Decreased strength  Visit Diagnosis: Acute pain of right shoulder  Stiffness of right shoulder, not elsewhere classified  Other symptoms and signs involving the musculoskeletal system  Abnormal posture     Problem List Patient Active Problem List   Diagnosis Date Noted  . Infection of prosthetic shoulder joint ([place]) 05/12/2019  . De Quervain's disease (tenosynovitis) 11/10/2018  . Shoulder pain, right 06/23/2018  . Lesion of mouth 12/23/2017  . Esophageal dysphagia 12/23/2017  . Mastocytosis 12/23/2017  . Vitiligo 12/23/2017  . Rosacea 12/23/2017  . Hypothyroidism 12/23/2017  . Anxiety and depression 12/23/2017   Kerin Perna, PTA 06/27/19 10:42 AM  [place], [place], [address] Phone: [phone]   Fax:  [phone]  Name: ZABELLA CHAFFEE MRN: [id number] Date of Birth: 1965/08/22

## 2019-06-29 ENCOUNTER — Ambulatory Visit (INDEPENDENT_AMBULATORY_CARE_PROVIDER_SITE_OTHER): Payer: Managed Care, Other (non HMO) | Admitting: Rehabilitative and Restorative Service Providers"

## 2019-06-29 ENCOUNTER — Encounter: Payer: Self-pay | Admitting: Rehabilitative and Restorative Service Providers"

## 2019-06-29 ENCOUNTER — Other Ambulatory Visit: Payer: Self-pay

## 2019-06-29 DIAGNOSIS — R293 Abnormal posture: Secondary | ICD-10-CM

## 2019-06-29 DIAGNOSIS — R29898 Other symptoms and signs involving the musculoskeletal system: Secondary | ICD-10-CM

## 2019-06-29 DIAGNOSIS — M25611 Stiffness of right shoulder, not elsewhere classified: Secondary | ICD-10-CM | POA: Diagnosis not present

## 2019-06-29 DIAGNOSIS — M25511 Pain in right shoulder: Secondary | ICD-10-CM

## 2019-06-29 MED FILL — SODIUM SULFACETAMIDE 10% WA: 10 | 25 days supply | Qty: 177 | Fill #1

## 2019-06-29 NOTE — Therapy (Signed)
[place], [place], [address] Phone: [phone]   Fax:  [phone]  Physical Therapy Treatment  Patient Details  Name: Kayla Lindsey MRN: [id number] Date of Birth: [birth year] Referring Provider (PT): Dr Ophelia Charter   Encounter Date: 06/29/2019  PT End of Session - 06/29/19 0805    Visit Number  16    Number of Visits  28    Date for PT Re-Evaluation  08/10/19    Authorization Type  [employer]IG[employer]    PT Start Time  0800    PT Stop Time  0852    PT Time Calculation (min)  52 min       Past Medical History:  Diagnosis Date  . Anxiety   . Depression   . Hashimoto's thyroiditis    hypo  . Hypothyroidism   . Mastocytosis   . Osteoarthritis    right shoulder  . Rheumatoid arthritis ([place])    mgd by Dr Jillyn Ledger, Maralyn Sago   . Vitiligo     Past Surgical History:  Procedure Laterality Date  . APPENDECTOMY    . BREAST BIOPSY Bilateral   . BREAST LUMPECTOMY Right   . carpel tunnel Bilateral   . CHOLECYSTECTOMY    . HIP SURGERY Left 02/2007   laboral repair   . SHOULDER ACROMIOPLASTY Right   . TOTAL SHOULDER ARTHROPLASTY Right 04/20/2019   Procedure: TOTAL SHOULDER ARTHROPLASTY;  Surgeon: Hiram Gash, MD;  Location: [facility];  Service: Orthopedics;  Laterality: Right;    There were no vitals filed for this visit.  Subjective Assessment - 06/29/19 0806    Subjective  Much worse today - has had increased pain since the last visit. Really hurt when she was on her stomach during therapy. Patient has had increased pain and tightness in the front of the Rt shoulder and arm. She has not been able to sleep again.    Currently in Pain?  Yes    Pain Score  6     Pain Location  Shoulder    Pain Orientation  Right    Pain Descriptors / Indicators  Aching    Pain Type  Chronic pain                       [facility] Adult PT Treatment/Exercise - 06/29/19 0001      Shoulder  Exercises: Standing   Other Standing Exercises  scap squeeze with noodle 10 sec x 15       Shoulder Exercises: Pulleys   Flexion  --   10 reps 10 sec hold    Scaption  --   10 reps x 10 sec      Moist Heat Therapy   Number Minutes Moist Heat  12 Minutes    Moist Heat Location  Shoulder;Cervical      Electrical Stimulation   Electrical Stimulation Location  Rt shoulder girdle     Electrical Stimulation Action  IFC    Electrical Stimulation Parameters  to tolerance    Electrical Stimulation Goals  Pain;Tone      Ultrasound   Ultrasound Location  anterior Rt shoulder     Ultrasound Parameters  1.5 w/cm2; 100%; 1 mHz; 7 min     Ultrasound Goals  Pain   tightness      Manual Therapy   Manual therapy comments  skilled palpation for DN; pt supine/hooklying with bolster    Soft tissue mobilization  STM to  Rt pecs; bicep, anterior/lateral/posterior deltoid, levator, upper trap, teres/lats    Scapular Mobilization  Rt     Passive ROM  Rt shoulder flexion; scaption    Manual Traction  Rt arm distraction with jiggle        Trigger Point Dry Needling - 06/29/19 0001    Consent Given?  Yes    Education Handout Provided  Previously provided    Other Dry Needling  Rt only     Deltoid Response  Palpable increased muscle length    Latissimus dorsi Response  Palpable increased muscle length   at bicipital grove    Biceps Response  Palpable increased muscle length             PT Short Term Goals - 06/13/19 0844      PT SHORT TERM GOAL #1   Title  I with initial HEP (06/01/2019)    Time  4    Status  Achieved    Target Date  06/01/19      PT SHORT TERM GOAL #2   Title  demo Rt shoulder PROM WFL ( 06/01/2019)    Time  4    Period  Weeks    Status  On-going    Target Date  06/01/19      PT SHORT TERM GOAL #3   Title  assess strength ( 06/01/2019)    Time  4    Period  Weeks    Status  On-going    Target Date  06/01/19      PT SHORT TERM GOAL #4   Title  I with bathing  and dressing ( 06/01/2019)    Time  4    Period  Weeks    Status  Achieved    Target Date  06/01/19        PT Long Term Goals - 06/29/19 0928      PT LONG TERM GOAL #1   Time  14    Period  Weeks    Status  Revised    Target Date  08/10/19      PT LONG TERM GOAL #2   Title  Increased AROM Rt shoulder to =/> AROM Lt shoulder with minimal to no pain    Time  14    Period  Weeks    Status  Revised    Target Date  08/10/19      PT LONG TERM GOAL #3   Title  dmeo Rt shoulder strength =/> 4+/5 to allow return to normal activity    Time  14    Period  Weeks    Status  Revised    Target Date  08/10/19      PT LONG TERM GOAL #4   Time  14    Period  Weeks    Status  Revised    Target Date  08/10/19      PT LONG TERM GOAL #5   Title  Improve FOTO to </= 44% limited    Time  14    Period  Weeks    Status  Revised    Target Date  08/10/19            Plan - 06/29/19 0931    Clinical Impression Statement  Significant flare up of symptoms following last visit. patient reports that she has been unable to sleep and has had increased pain in the anterior shoudler. She has increased muscular tightness to palpation through the Rt shoulder girdle - especially  anterior shoulder. Good response to DN/manual work and modalities.    Rehab Potential  Excellent    PT Frequency  2x / week    PT Duration  12 weeks    PT Treatment/Interventions  Iontophoresis 4mg /ml Dexamethasone;Taping;Vasopneumatic Device;Patient/family education;Moist Heat;Ultrasound;Scar mobilization;Passive range of motion;Therapeutic exercise;Cryotherapy;Electrical Stimulation;Manual techniques;Dry needling    PT Next Visit Plan  continue to work on pain management - progressing exercise per protocol   no shoulder extension/IR until 12 wks per protocol.    PT Home Exercise Plan  [id number]    Consulted and Agree with Plan of Care  Patient       Patient will benefit from skilled therapeutic intervention in order to  improve the following deficits and impairments:     Visit Diagnosis: Acute pain of right shoulder - Plan: PT plan of care cert/re-cert  Stiffness of right shoulder, not elsewhere classified - Plan: PT plan of care cert/re-cert  Other symptoms and signs involving the musculoskeletal system - Plan: PT plan of care cert/re-cert  Abnormal posture - Plan: PT plan of care cert/re-cert     Problem List Patient Active Problem List   Diagnosis Date Noted  . Infection of prosthetic shoulder joint ([place]) 05/12/2019  . De Quervain's disease (tenosynovitis) 11/10/2018  . Shoulder pain, right 06/23/2018  . Lesion of mouth 12/23/2017  . Esophageal dysphagia 12/23/2017  . Mastocytosis 12/23/2017  . Vitiligo 12/23/2017  . Rosacea 12/23/2017  . Hypothyroidism 12/23/2017  . Anxiety and depression 12/23/2017    Vanesha Athens Nilda Simmer PT, MPH  06/29/2019, 9:38 AM  [facility] [place], [place], [address] Phone: [phone]   Fax:  [phone]  Name: MASSIAH KUBILUS MRN: [id number] Date of Birth: Sep 09, 1965

## 2019-07-04 ENCOUNTER — Encounter: Payer: Self-pay | Admitting: Rehabilitative and Restorative Service Providers"

## 2019-07-04 ENCOUNTER — Other Ambulatory Visit: Payer: Self-pay

## 2019-07-04 ENCOUNTER — Ambulatory Visit (INDEPENDENT_AMBULATORY_CARE_PROVIDER_SITE_OTHER): Payer: Managed Care, Other (non HMO) | Admitting: Rehabilitative and Restorative Service Providers"

## 2019-07-04 DIAGNOSIS — R29898 Other symptoms and signs involving the musculoskeletal system: Secondary | ICD-10-CM | POA: Diagnosis not present

## 2019-07-04 DIAGNOSIS — M25511 Pain in right shoulder: Secondary | ICD-10-CM | POA: Diagnosis not present

## 2019-07-04 DIAGNOSIS — M25611 Stiffness of right shoulder, not elsewhere classified: Secondary | ICD-10-CM | POA: Diagnosis not present

## 2019-07-04 DIAGNOSIS — R293 Abnormal posture: Secondary | ICD-10-CM

## 2019-07-04 NOTE — Therapy (Signed)
[place], [place], [address] Phone: [phone]   Fax:  [phone]  Physical Therapy Treatment  Patient Details  Name: Kayla Lindsey MRN: [id number] Date of Birth: 05/15/1965 Referring Provider (PT): Dr Ophelia Charter   Encounter Date: 07/04/2019  PT End of Session - 07/04/19 0935    Visit Number  17    Number of Visits  28    Date for PT Re-Evaluation  08/10/19    Authorization Type  CIGNA    PT Start Time  0933    PT Stop Time  1026    PT Time Calculation (min)  53 min    Activity Tolerance  Patient tolerated treatment well       Past Medical History:  Diagnosis Date  . Anxiety   . Depression   . Hashimoto's thyroiditis    hypo  . Hypothyroidism   . Mastocytosis   . Osteoarthritis    right shoulder  . Rheumatoid arthritis ([place])    mgd by Dr Jillyn Ledger, Maralyn Sago   . Vitiligo     Past Surgical History:  Procedure Laterality Date  . APPENDECTOMY    . BREAST BIOPSY Bilateral   . BREAST LUMPECTOMY Right   . carpel tunnel Bilateral   . CHOLECYSTECTOMY    . HIP SURGERY Left 02/2007   laboral repair   . SHOULDER ACROMIOPLASTY Right   . TOTAL SHOULDER ARTHROPLASTY Right 04/20/2019   Procedure: TOTAL SHOULDER ARTHROPLASTY;  Surgeon: Hiram Gash, MD;  Location: [facility];  Service: Orthopedics;  Laterality: Right;    There were no vitals filed for this visit.  Subjective Assessment - 07/04/19 0936    Subjective  Patient reports that her shoulder is feeling so much better. She cleaned her house yesterday - had some increased pain last night. She has difficulty with sleeping but pain is generally better. Please with response to DN and is usin the TENS anbd heat at home every night as well as muscle relaxant.    Currently in Pain?  Yes    Pain Score  1     Pain Location  Shoulder    Pain Orientation  Right    Pain Descriptors / Indicators  Aching;Tightness    Pain  Type  Chronic pain                       [facility] Adult PT Treatment/Exercise - 07/04/19 0001      Shoulder Exercises: Pulleys   Flexion  --   10 reps 10 sec hold    Scaption  --   10 reps x 10 sec      Moist Heat Therapy   Number Minutes Moist Heat  15 Minutes    Moist Heat Location  Shoulder;Cervical      Electrical Stimulation   Electrical Stimulation Location  Rt shoulder girdle     Electrical Stimulation Action  IFC    Electrical Stimulation Parameters  to tolerance    Electrical Stimulation Goals  Pain;Tone      Manual Therapy   Manual therapy comments  skilled palpation for DN; pt supine/hooklying with bolster    Soft tissue mobilization  STM to Rt pecs; bicep, anterior/lateral/posterior deltoid, levator, upper trap, teres/lats    Scapular Mobilization  Rt     Passive ROM  Rt shoulder flexion; scaption    Manual Traction  Rt arm distraction with jiggle  Trigger Point Dry Needling - 07/04/19 0001    Consent Given?  Yes    Education Handout Provided  Previously provided    Other Dry Needling  Rt only     Scalenes Response  Palpable increased muscle length    Pectoralis Major Response  Palpable increased muscle length    Pectoralis Minor Response  Palpable increased muscle length    Deltoid Response  Palpable increased muscle length    Latissimus dorsi Response  Palpable increased muscle length   at bicipital grove    Biceps Response  Palpable increased muscle length             PT Short Term Goals - 06/13/19 0844      PT SHORT TERM GOAL #1   Title  I with initial HEP (06/01/2019)    Time  4    Status  Achieved    Target Date  06/01/19      PT SHORT TERM GOAL #2   Title  demo Rt shoulder PROM WFL ( 06/01/2019)    Time  4    Period  Weeks    Status  On-going    Target Date  06/01/19      PT SHORT TERM GOAL #3   Title  assess strength ( 06/01/2019)    Time  4    Period  Weeks    Status  On-going    Target Date  06/01/19      PT  SHORT TERM GOAL #4   Title  I with bathing and dressing ( 06/01/2019)    Time  4    Period  Weeks    Status  Achieved    Target Date  06/01/19        PT Long Term Goals - 06/29/19 0928      PT LONG TERM GOAL #1   Time  14    Period  Weeks    Status  Revised    Target Date  08/10/19      PT LONG TERM GOAL #2   Title  Increased AROM Rt shoulder to =/> AROM Lt shoulder with minimal to no pain    Time  14    Period  Weeks    Status  Revised    Target Date  08/10/19      PT LONG TERM GOAL #3   Title  dmeo Rt shoulder strength =/> 4+/5 to allow return to normal activity    Time  14    Period  Weeks    Status  Revised    Target Date  08/10/19      PT LONG TERM GOAL #4   Time  14    Period  Weeks    Status  Revised    Target Date  08/10/19      PT LONG TERM GOAL #5   Title  Improve FOTO to </= 44% limited    Time  14    Period  Weeks    Status  Revised    Target Date  08/10/19            Plan - 07/04/19 1019    Clinical Impression Statement  Improvement in sypmtoms this week. Less pain and improved functional activities. Patient is continuing to have difficulty with    Rehab Potential  Excellent    PT Frequency  2x / week    PT Duration  12 weeks    PT Treatment/Interventions  Iontophoresis 4mg /ml Dexamethasone;Taping;Vasopneumatic Device;Patient/family education;Moist  Heat;Ultrasound;Scar mobilization;Passive range of motion;Therapeutic exercise;Cryotherapy;Electrical Stimulation;Manual techniques;Dry needling    PT Next Visit Plan  continue to work on pain management - progressing exercise per protocol   no shoulder extension/IR until 12 wks per protocol.    PT Home Exercise Plan  [id number]    Consulted and Agree with Plan of Care  Patient       Patient will benefit from skilled therapeutic intervention in order to improve the following deficits and impairments:     Visit Diagnosis: Acute pain of right shoulder  Stiffness of right shoulder, not elsewhere  classified  Other symptoms and signs involving the musculoskeletal system  Abnormal posture     Problem List Patient Active Problem List   Diagnosis Date Noted  . 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 De Quervain's disease (tenosynovitis) 11/10/2018  . Shoulder pain, right 06/23/2018  . Lesion of mouth 12/23/2017  . Esophageal dysphagia 12/23/2017  . Mastocytosis 12/23/2017  . Vitiligo 12/23/2017  . Rosacea 12/23/2017  . Hypothyroidism 12/23/2017  . Anxiety and depression 12/23/2017    Khari Mally Nilda Simmer PT, MPH  07/04/2019, 10:23 AM  [facility] [place], [place], [address] Phone: [phone]   Fax:  [phone]  Name: Kayla Lindsey MRN: [id number] Date of Birth: [birth year]

## 2019-07-06 ENCOUNTER — Other Ambulatory Visit: Payer: Self-pay

## 2019-07-06 ENCOUNTER — Encounter: Payer: Self-pay | Admitting: Rehabilitative and Restorative Service Providers"

## 2019-07-06 ENCOUNTER — Ambulatory Visit (INDEPENDENT_AMBULATORY_CARE_PROVIDER_SITE_OTHER): Payer: Managed Care, Other (non HMO) | Admitting: Rehabilitative and Restorative Service Providers"

## 2019-07-06 DIAGNOSIS — M25611 Stiffness of right shoulder, not elsewhere classified: Secondary | ICD-10-CM

## 2019-07-06 DIAGNOSIS — R29898 Other symptoms and signs involving the musculoskeletal system: Secondary | ICD-10-CM | POA: Diagnosis not present

## 2019-07-06 DIAGNOSIS — R293 Abnormal posture: Secondary | ICD-10-CM

## 2019-07-06 DIAGNOSIS — M25511 Pain in right shoulder: Secondary | ICD-10-CM | POA: Diagnosis not present

## 2019-07-06 NOTE — Therapy (Signed)
[place], [place], [address] Phone: [phone]   Fax:  [phone]  Physical Therapy Treatment  Patient Details  Name: Kayla Lindsey MRN: [id number] Date of Birth: 05-27-1965 Referring Provider (PT): Dr Ophelia Charter   Encounter Date: 07/06/2019  PT End of Session - 07/06/19 0810    Visit Number  18    Number of Visits  28    Date for PT Re-Evaluation  08/10/19    Authorization Type  CIGNA    PT Start Time  0802    PT Stop Time  0852    PT Time Calculation (min)  50 min    Activity Tolerance  Patient tolerated treatment well       Past Medical History:  Diagnosis Date  . Anxiety   . Depression   . Hashimoto's thyroiditis    hypo  . Hypothyroidism   . Mastocytosis   . Osteoarthritis    right shoulder  . Rheumatoid arthritis ([place])    mgd by Dr Jillyn Ledger, Maralyn Sago   . Vitiligo     Past Surgical History:  Procedure Laterality Date  . APPENDECTOMY    . BREAST BIOPSY Bilateral   . BREAST LUMPECTOMY Right   . carpel tunnel Bilateral   . CHOLECYSTECTOMY    . HIP SURGERY Left 02/2007   laboral repair   . SHOULDER ACROMIOPLASTY Right   . TOTAL SHOULDER ARTHROPLASTY Right 04/20/2019   Procedure: TOTAL SHOULDER ARTHROPLASTY;  Surgeon: Hiram Gash, MD;  Location: [facility];  Service: Orthopedics;  Laterality: Right;    There were no vitals filed for this visit.  Subjective Assessment - 07/06/19 0811    Subjective  Sore from DN - sleeping some better. No pain this morning - just soreness. Working on exerciess at home including theraband exercises.    Currently in Pain?  No/denies                       [facility] Adult PT Treatment/Exercise - 07/06/19 0001      Shoulder Exercises: Supine   Other Supine Exercises  prolonged snow angel on noodle 3-4 min UE's at ~ 30 deg abd     Other Supine Exercises  scap squeeze       Shoulder Exercises: Pulleys   Flexion  --   10 reps 10 sec hold    Scaption  --   10 reps x 10 sec      Vasopneumatic   Number Minutes Vasopneumatic   10 minutes    Vasopnuematic Location   Shoulder    Vasopneumatic Pressure  Low    Vasopneumatic Temperature   34      Manual Therapy   Soft tissue mobilization  deep tissue work through the pecs/anterior shoulder; upper trap/leveator; teres/posterior deltoid/triceps     Scapular Mobilization  Rt     Passive ROM  Rt shoulder flexion; scaption               PT Short Term Goals - 06/13/19 0844      PT SHORT TERM GOAL #1   Title  I with initial HEP (06/01/2019)    Time  4    Status  Achieved    Target Date  06/01/19      PT SHORT TERM GOAL #2   Title  demo Rt shoulder PROM WFL ( 06/01/2019)    Time  4    Period  Weeks  Status  On-going    Target Date  06/01/19      PT SHORT TERM GOAL #3   Title  assess strength ( 06/01/2019)    Time  4    Period  Weeks    Status  On-going    Target Date  06/01/19      PT SHORT TERM GOAL #4   Title  I with bathing and dressing ( 06/01/2019)    Time  4    Period  Weeks    Status  Achieved    Target Date  06/01/19        PT Long Term Goals - 06/29/19 0928      PT LONG TERM GOAL #1   Time  14    Period  Weeks    Status  Revised    Target Date  08/10/19      PT LONG TERM GOAL #2   Title  Increased AROM Rt shoulder to =/> AROM Lt shoulder with minimal to no pain    Time  14    Period  Weeks    Status  Revised    Target Date  08/10/19      PT LONG TERM GOAL #3   Title  dmeo Rt shoulder strength =/> 4+/5 to allow return to normal activity    Time  14    Period  Weeks    Status  Revised    Target Date  08/10/19      PT LONG TERM GOAL #4   Time  14    Period  Weeks    Status  Revised    Target Date  08/10/19      PT LONG TERM GOAL #5   Title  Improve FOTO to </= 44% limited    Time  14    Period  Weeks    Status  Revised    Target Date  08/10/19            Plan - 07/06/19 0811     Clinical Impression Statement  Continued improvement with decreased pain and increased mobility and ROM. Patient has been sleeping better but still not great. Continued focus on muscular tightness and pain. Good release of persistent tightness posterior shoulder girdle with manual work and release work. Eleven weks this week. Returns to MD next week and will progress exercise per protocol at twelve weeks.    Rehab Potential  Good    PT Frequency  2x / week    PT Duration  12 weeks    PT Treatment/Interventions  Iontophoresis 4mg /ml Dexamethasone;Taping;Vasopneumatic Device;Patient/family education;Moist Heat;Ultrasound;Scar mobilization;Passive range of motion;Therapeutic exercise;Cryotherapy;Electrical Stimulation;Manual techniques;Dry needling    PT Next Visit Plan  continue to work on pain management - progressing exercise per protocol   no shoulder extension/IR until 12 wks per protocol.    PT Home Exercise Plan  [id number]    Consulted and Agree with Plan of Care  Patient       Patient will benefit from skilled therapeutic intervention in order to improve the following deficits and impairments:     Visit Diagnosis: Acute pain of right shoulder  Stiffness of right shoulder, not elsewhere classified  Other symptoms and signs involving the musculoskeletal system  Abnormal posture     Problem List Patient Active Problem List   Diagnosis Date Noted  . 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 De Quervain's disease (tenosynovitis) 11/10/2018  . Shoulder pain, right 06/23/2018  . Lesion of mouth  12/23/2017  . Esophageal dysphagia 12/23/2017  . Mastocytosis 12/23/2017  . Vitiligo 12/23/2017  . Rosacea 12/23/2017  . Hypothyroidism 12/23/2017  . Anxiety and depression 12/23/2017    Ladarrius Bogdanski Nilda Simmer PT, MPH  07/06/2019, 8:47 AM  [facility] [place], [place], [address] Phone: [phone]   Fax:   [phone]  Name: Kayla Lindsey MRN: [id number] Date of Birth: 02-10-1966

## 2019-07-11 ENCOUNTER — Encounter: Payer: Self-pay | Admitting: Rehabilitative and Restorative Service Providers"

## 2019-07-11 ENCOUNTER — Ambulatory Visit (INDEPENDENT_AMBULATORY_CARE_PROVIDER_SITE_OTHER): Payer: Managed Care, Other (non HMO) | Admitting: Rehabilitative and Restorative Service Providers"

## 2019-07-11 ENCOUNTER — Other Ambulatory Visit: Payer: Self-pay

## 2019-07-11 DIAGNOSIS — M25611 Stiffness of right shoulder, not elsewhere classified: Secondary | ICD-10-CM | POA: Diagnosis not present

## 2019-07-11 DIAGNOSIS — R293 Abnormal posture: Secondary | ICD-10-CM | POA: Diagnosis not present

## 2019-07-11 DIAGNOSIS — M25511 Pain in right shoulder: Secondary | ICD-10-CM | POA: Diagnosis not present

## 2019-07-11 DIAGNOSIS — R29898 Other symptoms and signs involving the musculoskeletal system: Secondary | ICD-10-CM | POA: Diagnosis not present

## 2019-07-11 NOTE — Therapy (Signed)
[facility], [place], [address] Phone: [phone]   Fax:  [phone]  Physical Therapy Treatment  Patient Details  Name: Kayla Lindsey MRN: [id number] Date of Birth: Jul 13, 1965 Referring Provider (PT): Dr Ophelia Charter   Encounter Date: 07/11/2019  PT End of Session - 07/11/19 0807    Visit Number  19    Number of Visits  28    Date for PT Re-Evaluation  08/10/19    Authorization Type  [employer]IG[employer]    PT Start Time  0800    PT Stop Time  0850    PT Time Calculation (min)  50 min    Activity Tolerance  Patient tolerated treatment well       Past Medical History:  Diagnosis Date  . Anxiety   . Depression   . Hashimoto's thyroiditis    hypo  . Hypothyroidism   . Mastocytosis   . Osteoarthritis    right shoulder  . Rheumatoid arthritis ([place])    mgd by Dr Jillyn Ledger, Maralyn Sago   . Vitiligo     Past Surgical History:  Procedure Laterality Date  . APPENDECTOMY    . BREAST BIOPSY Bilateral   . BREAST LUMPECTOMY Right   . carpel tunnel Bilateral   . CHOLECYSTECTOMY    . HIP SURGERY Left 02/2007   laboral repair   . SHOULDER ACROMIOPLASTY Right   . TOTAL SHOULDER ARTHROPLASTY Right 04/20/2019   Procedure: TOTAL SHOULDER ARTHROPLASTY;  Surgeon: Hiram Gash, MD;  Location: [facility];  Service: Orthopedics;  Laterality: Right;    There were no vitals filed for this visit.  Subjective Assessment - 07/11/19 0809    Subjective  No pain! Sleeping a little bit better. Started a new medication for sleep which she thinks will help.    Currently in Pain?  No/denies         [facility] PT Assessment - 07/11/19 0001      Assessment   Medical Diagnosis  Rt total shoulder    Referring Provider (PT)  Dr Ophelia Charter    Onset Date/Surgical Date  04/20/19    Hand Dominance  Right    Next MD Visit  07/21/19    Prior Therapy  yes      Palpation   Palpation comment  persistent muscular  tightnes Rt shoulder girdle - pecs; deltoid; upper trap; leveator; [facility] Adult PT Treatment/Exercise - 07/11/19 0001      Shoulder Exercises: Standing   External Rotation  Strengthening;Both;20 reps;Theraband    Theraband Level (Shoulder External Rotation)  Level 2 (Red)    Row  Strengthening;Both;20 reps;Theraband    Theraband Level (Shoulder Row)  Level 2 (Red)    Retraction  Strengthening;Both;20 reps      Shoulder Exercises: Pulleys   Flexion  --   10 reps 10 sec hold    Scaption  --   10 reps x 10 sec      Moist Heat Therapy   Number Minutes Moist Heat  12 Minutes    Moist Heat Location  Shoulder;Cervical      Electrical Stimulation   Electrical Stimulation Location  Rt shoulder girdle     Electrical Stimulation Action  IFC    Electrical Stimulation Parameters  to tolerance    Electrical Stimulation Goals  Pain;Tone      Manual Therapy  Manual therapy comments  skilled palpation for DN; pt supine/hooklying with bolster    Soft tissue mobilization  deep tissue work through the pecs/anterior shoulder; upper trap/leveator; teres/posterior deltoid/triceps     Scapular Mobilization  Rt     Passive ROM  Rt shoulder flexion; scaption       Trigger Point Dry Needling - 07/11/19 0001    Consent Given?  Yes    Education Handout Provided  Previously provided    Other Dry Needling  Rt only     Pectoralis Major Response  Palpable increased muscle length    Pectoralis Minor Response  Palpable increased muscle length    Deltoid Response  Palpable increased muscle length    Teres major Response  Palpable increased muscle length    Teres minor Response  Palpable increased muscle length    Biceps Response  Palpable increased muscle length             PT Short Term Goals - 06/13/19 0844      PT SHORT TERM GOAL #1   Title  I with initial HEP (06/01/2019)    Time  4    Status  Achieved    Target Date  06/01/19      PT SHORT TERM GOAL  #2   Title  demo Rt shoulder PROM WFL ( 06/01/2019)    Time  4    Period  Weeks    Status  On-going    Target Date  06/01/19      PT SHORT TERM GOAL #3   Title  assess strength ( 06/01/2019)    Time  4    Period  Weeks    Status  On-going    Target Date  06/01/19      PT SHORT TERM GOAL #4   Title  I with bathing and dressing ( 06/01/2019)    Time  4    Period  Weeks    Status  Achieved    Target Date  06/01/19        PT Long Term Goals - 06/29/19 0928      PT LONG TERM GOAL #1   Time  14    Period  Weeks    Status  Revised    Target Date  08/10/19      PT LONG TERM GOAL #2   Title  Increased AROM Rt shoulder to =/> AROM Lt shoulder with minimal to no pain    Time  14    Period  Weeks    Status  Revised    Target Date  08/10/19      PT LONG TERM GOAL #3   Title  dmeo Rt shoulder strength =/> 4+/5 to allow return to normal activity    Time  14    Period  Weeks    Status  Revised    Target Date  08/10/19      PT LONG TERM GOAL #4   Time  14    Period  Weeks    Status  Revised    Target Date  08/10/19      PT LONG TERM GOAL #5   Title  Improve FOTO to </= 44% limited    Time  14    Period  Weeks    Status  Revised    Target Date  08/10/19            Plan - 07/11/19 0847    Clinical Impression Statement  Continued progress  with pain management. Patient has responded well to DN/manual work. She has increased functional activities. Progressed TB resistance today. Continued palpable tightness noted Rt shoulder girdle. Progressing well - within protocol.    Rehab Potential  Good    PT Frequency  2x / week    PT Duration  12 weeks    PT Treatment/Interventions  Iontophoresis 4mg /ml Dexamethasone;Taping;Vasopneumatic Device;Patient/family education;Moist Heat;Ultrasound;Scar mobilization;Passive range of motion;Therapeutic exercise;Cryotherapy;Electrical Stimulation;Manual techniques;Dry needling    PT Next Visit Plan  continue to work on pain management -  progressing exercise per protocol   no shoulder extension/IR until 3 months per protocol.    PT Home Exercise Plan  [id number]    Consulted and Agree with Plan of Care  Patient       Patient will benefit from skilled therapeutic intervention in order to improve the following deficits and impairments:     Visit Diagnosis: Acute pain of right shoulder  Stiffness of right shoulder, not elsewhere classified  Other symptoms and signs involving the musculoskeletal system  Abnormal posture     Problem List Patient Active Problem List   Diagnosis Date Noted  . 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 De Quervain's disease (tenosynovitis) 11/10/2018  . Shoulder pain, right 06/23/2018  . Lesion of mouth 12/23/2017  . Esophageal dysphagia 12/23/2017  . Mastocytosis 12/23/2017  . Vitiligo 12/23/2017  . Rosacea 12/23/2017  . Hypothyroidism 12/23/2017  . Anxiety and depression 12/23/2017    Nisha Dhami Nilda Simmer PT, MPH  07/11/2019, 8:49 AM  [facility] [place], [place], [address] Phone: [phone]   Fax:  [phone]  Name: JOVONNE WOHLWEND MRN: [id number] Date of Birth: [birth year]

## 2019-07-13 ENCOUNTER — Encounter: Payer: Self-pay | Admitting: Rehabilitative and Restorative Service Providers"

## 2019-07-13 ENCOUNTER — Ambulatory Visit (INDEPENDENT_AMBULATORY_CARE_PROVIDER_SITE_OTHER): Payer: Managed Care, Other (non HMO) | Admitting: Rehabilitative and Restorative Service Providers"

## 2019-07-13 ENCOUNTER — Encounter: Payer: Managed Care, Other (non HMO) | Admitting: Rehabilitative and Restorative Service Providers"

## 2019-07-13 ENCOUNTER — Other Ambulatory Visit: Payer: Self-pay

## 2019-07-13 DIAGNOSIS — M25611 Stiffness of right shoulder, not elsewhere classified: Secondary | ICD-10-CM

## 2019-07-13 DIAGNOSIS — R293 Abnormal posture: Secondary | ICD-10-CM

## 2019-07-13 DIAGNOSIS — R29898 Other symptoms and signs involving the musculoskeletal system: Secondary | ICD-10-CM | POA: Diagnosis not present

## 2019-07-13 DIAGNOSIS — M25511 Pain in right shoulder: Secondary | ICD-10-CM

## 2019-07-13 NOTE — Patient Instructions (Signed)
Flexibility: Corner Stretch    Standing in corner with hands just above shoulder level one foot forward, shift hips forward until a comfortable stretch is felt across chest or in arm. Hold _20-30___ seconds. Repeat __3__ times per set. Do ___1-2_ sets per day.    Stretch across buttocks  Strap in right hand - pulling hand across the buttocks with left hand  Hold 10 sec x 3 reps x 2/day   Strengthening: Isometric Extension    Using wall for resistance, press back of left arm into ball using light pressure. Hold __5-10__ seconds. Repeat __10__ times per set. Do __1-2__ sets per session. Do _1___ sessions per day.

## 2019-07-13 NOTE — Therapy (Signed)
[place], [place], [address] Phone: [phone]   Fax:  [phone]  Physical Therapy Treatment  Patient Details  Name: Kayla Lindsey MRN: [id number] Date of Birth: 1966-03-03 Referring Provider (PT): Dr Ophelia Charter   Encounter Date: 07/13/2019  PT End of Session - 07/13/19 0811    Visit Number  20    Number of Visits  28    Date for PT Re-Evaluation  08/10/19    PT Start Time  0802    PT Stop Time  0900    PT Time Calculation (min)  58 min    Activity Tolerance  Patient tolerated treatment well       Past Medical History:  Diagnosis Date  . Anxiety   . Depression   . Hashimoto's thyroiditis    hypo  . Hypothyroidism   . Mastocytosis   . Osteoarthritis    right shoulder  . Rheumatoid arthritis ([place])    mgd by Dr Jillyn Ledger, Maralyn Sago   . Vitiligo     Past Surgical History:  Procedure Laterality Date  . APPENDECTOMY    . BREAST BIOPSY Bilateral   . BREAST LUMPECTOMY Right   . carpel tunnel Bilateral   . CHOLECYSTECTOMY    . HIP SURGERY Left 02/2007   laboral repair   . SHOULDER ACROMIOPLASTY Right   . TOTAL SHOULDER ARTHROPLASTY Right 04/20/2019   Procedure: TOTAL SHOULDER ARTHROPLASTY;  Surgeon: Hiram Gash, MD;  Location: [facility];  Service: Orthopedics;  Laterality: Right;    There were no vitals filed for this visit.  Subjective Assessment - 07/13/19 0811    Subjective  No pain - just sore. Still struggling with sleeping.    Currently in Pain?  No/denies         [facility] PT Assessment - 07/13/19 0001      Assessment   Medical Diagnosis  Rt total shoulder    Referring Provider (PT)  Dr Ophelia Charter    Onset Date/Surgical Date  04/20/19    Hand Dominance  Right    Next MD Visit  07/21/19    Prior Therapy  yes      AROM   Right Shoulder Extension  43 Degrees    Right Shoulder Flexion  132 Degrees    Right Shoulder ABduction  99 Degrees    Right  Shoulder Internal Rotation  --   hand to lateral hip   Right Shoulder External Rotation  77 Degrees   arm at side                   [facility] Adult PT Treatment/Exercise - 07/13/19 0001      Shoulder Exercises: ROM/Strengthening   UBE (Upper Arm Bike)  L1 1/1/1 fwd/back/fwd       Shoulder Exercises: Isometric Strengthening   Extension  5X10"   standing     Shoulder Exercises: Stretch   Corner Stretch  2 reps;30 seconds    Other Shoulder Stretches  bringing Rt hand across buttocks assisting w/ strap 20 sec x4 reps       Vasopneumatic   Number Minutes Vasopneumatic   15 minutes    Vasopnuematic Location   Shoulder    Vasopneumatic Pressure  Medium    Vasopneumatic Temperature   34      Manual Therapy   Soft tissue mobilization  deep tissue work through the pecs/anterior shoulder; upper trap/leveator; teres/posterior deltoid/triceps     Scapular Mobilization  Rt     Passive ROM  Rt shoulder flexion; scaption; extension; horizontal adduction and horizontal abduction              PT Education - 07/13/19 0823    Education Details  HEP    Person(s) Educated  Patient    Methods  Explanation;Demonstration;Tactile cues;Verbal cues;Handout    Comprehension  Verbalized understanding;Returned demonstration;Verbal cues required;Tactile cues required       PT Short Term Goals - 06/13/19 0844      PT SHORT TERM GOAL #1   Title  I with initial HEP (06/01/2019)    Time  4    Status  Achieved    Target Date  06/01/19      PT SHORT TERM GOAL #2   Title  demo Rt shoulder PROM WFL ( 06/01/2019)    Time  4    Period  Weeks    Status  On-going    Target Date  06/01/19      PT SHORT TERM GOAL #3   Title  assess strength ( 06/01/2019)    Time  4    Period  Weeks    Status  On-going    Target Date  06/01/19      PT SHORT TERM GOAL #4   Title  I with bathing and dressing ( 06/01/2019)    Time  4    Period  Weeks    Status  Achieved    Target Date  06/01/19         PT Long Term Goals - 06/29/19 0928      PT LONG TERM GOAL #1   Time  14    Period  Weeks    Status  Revised    Target Date  08/10/19      PT LONG TERM GOAL #2   Title  Increased AROM Rt shoulder to =/> AROM Lt shoulder with minimal to no pain    Time  14    Period  Weeks    Status  Revised    Target Date  08/10/19      PT LONG TERM GOAL #3   Title  dmeo Rt shoulder strength =/> 4+/5 to allow return to normal activity    Time  14    Period  Weeks    Status  Revised    Target Date  08/10/19      PT LONG TERM GOAL #4   Time  14    Period  Weeks    Status  Revised    Target Date  08/10/19      PT LONG TERM GOAL #5   Title  Improve FOTO to </= 44% limited    Time  14    Period  Weeks    Status  Revised    Target Date  08/10/19            Plan - 07/13/19 0914    Clinical Impression Statement  Continued improvement in pain Rt shoulder. Added isometric extension and horizontal adduction behind back to work toward IR gently. Continued palpable tightness through the anterior shoulder/upper taps/leveator/triceps. Will benefit from continued PT to address remaining deficits    Rehab Potential  Good    PT Frequency  2x / week    PT Duration  12 weeks    PT Treatment/Interventions  Iontophoresis 4mg /ml Dexamethasone;Taping;Vasopneumatic Device;Patient/family education;Moist Heat;Ultrasound;Scar mobilization;Passive range of motion;Therapeutic exercise;Cryotherapy;Electrical Stimulation;Manual techniques;Dry needling    PT Next Visit Plan  continue to  work on pain management - progressing exercise per protocol   gradually add shoulder extension/IR now at 3 months per protocol.    PT Home Exercise Plan  [id number]    Consulted and Agree with Plan of Care  Patient       Patient will benefit from skilled therapeutic intervention in order to improve the following deficits and impairments:     Visit Diagnosis: Acute pain of right shoulder  Stiffness of right shoulder, not  elsewhere classified  Other symptoms and signs involving the musculoskeletal system  Abnormal posture     Problem List Patient Active Problem List   Diagnosis Date Noted  . Infection of prosthetic shoulder joint ([place]) 05/12/2019  . De Quervain's disease (tenosynovitis) 11/10/2018  . Shoulder pain, right 06/23/2018  . Lesion of mouth 12/23/2017  . Esophageal dysphagia 12/23/2017  . Mastocytosis 12/23/2017  . Vitiligo 12/23/2017  . Rosacea 12/23/2017  . Hypothyroidism 12/23/2017  . Anxiety and depression 12/23/2017    Summit Borchardt Nilda Simmer PT, MPH  07/13/2019, 9:24 AM  [facility] [place], [place], [address] Phone: [phone]   Fax:  [phone]  Name: Kayla Lindsey MRN: [id number] Date of Birth: September 25, 1965

## 2019-07-17 ENCOUNTER — Other Ambulatory Visit: Payer: Self-pay

## 2019-07-17 ENCOUNTER — Encounter: Payer: Self-pay | Admitting: Rehabilitative and Restorative Service Providers"

## 2019-07-17 ENCOUNTER — Ambulatory Visit (INDEPENDENT_AMBULATORY_CARE_PROVIDER_SITE_OTHER): Payer: Managed Care, Other (non HMO) | Admitting: Rehabilitative and Restorative Service Providers"

## 2019-07-17 DIAGNOSIS — R293 Abnormal posture: Secondary | ICD-10-CM

## 2019-07-17 DIAGNOSIS — R29898 Other symptoms and signs involving the musculoskeletal system: Secondary | ICD-10-CM

## 2019-07-17 DIAGNOSIS — M25511 Pain in right shoulder: Secondary | ICD-10-CM

## 2019-07-17 DIAGNOSIS — M25611 Stiffness of right shoulder, not elsewhere classified: Secondary | ICD-10-CM

## 2019-07-17 NOTE — Therapy (Signed)
[name], [place], [address] Phone: [phone]   Fax:  [phone]  Physical Therapy Treatment  Patient Details  Name: Kayla Lindsey MRN: [id number] Date of Birth: Aug 22, 1965 Referring Provider (PT): Dr Ophelia Charter   Encounter Date: 07/17/2019  PT End of Session - 07/17/19 0850    Visit Number  21    Number of Visits  28    Date for PT Re-Evaluation  08/10/19    Authorization Type  CIG[employer]    PT Start Time  0845    PT Stop Time  0942    PT Time Calculation (min)  57 min       Past Medical History:  Diagnosis Date  . Anxiety   . Depression   . Hashimoto's thyroiditis    hypo  . Hypothyroidism   . Mastocytosis   . Osteoarthritis    right shoulder  . Rheumatoid arthritis ([place])    mgd by Dr Jillyn Ledger, Maralyn Sago   . Vitiligo     Past Surgical History:  Procedure Laterality Date  . APPENDECTOMY    . BREAST BIOPSY Bilateral   . BREAST LUMPECTOMY Right   . carpel tunnel Bilateral   . CHOLECYSTECTOMY    . HIP SURGERY Left 02/2007   laboral repair   . SHOULDER ACROMIOPLASTY Right   . TOTAL SHOULDER ARTHROPLASTY Right 04/20/2019   Procedure: TOTAL SHOULDER ARTHROPLASTY;  Surgeon: Hiram Gash, MD;  Location: [facility];  Service: Orthopedics;  Laterality: Right;    There were no vitals filed for this visit.  Subjective Assessment - 07/17/19 0851    Subjective  Patient reports that she is doing ok. Has some soreness - did some yard work yesterday. she is doing more with her UE. Sleep is still difficult    Currently in Pain?  No/denies    Pain Location  Shoulder    Pain Orientation  Right    Pain Descriptors / Indicators  Sore    Pain Type  Chronic pain    Pain Onset  More than a month ago    Pain Frequency  Constant         [facility] PT Assessment - 07/17/19 0001      Assessment   Medical Diagnosis  Rt total shoulder    Referring Provider (PT)  Dr Ophelia Charter    Onset Date/Surgical Date  04/20/19    Hand Dominance  Right    Next MD Visit  07/21/19    Prior Therapy  yes      AROM   Right Shoulder Extension  43 Degrees    Right Shoulder Flexion  132 Degrees    Right Shoulder ABduction  99 Degrees    Right Shoulder Internal Rotation  --   hand to lateral hip   Right Shoulder External Rotation  77 Degrees   arm at side      PROM   Right Shoulder Flexion  140 Degrees    Right Shoulder ABduction  135 Degrees   in scapular plane    Right Shoulder External Rotation  90 Degrees   shoulder 90 deg abd; scapular plane; elbow 90 deg flex     Palpation   Palpation comment  persistent muscular tightnes Rt shoulder girdle - pecs; deltoid; upper trap; leveator; teres;                   [facility] Adult PT Treatment/Exercise - 07/17/19 0001  Shoulder Exercises: Standing   Other Standing Exercises  scap squeeze with noodle 10 sec x 15     Other Standing Exercises  L's x 5 sec x 10 reps, shoulder rolls x 10 backwards.       Shoulder Exercises: Therapy Ball   Other Therapy Ball Exercises  ball on wall over head rolling ball side to side x 1 min x 2 reps       Shoulder Exercises: ROM/Strengthening   UBE (Upper Arm Bike)  L2 1/1/1/1 fwd/back/fwd       Shoulder Exercises: Body Blade   Flexion  60 seconds;3 reps;30 seconds;1 rep      Vasopneumatic   Number Minutes Vasopneumatic   15 minutes    Vasopnuematic Location   Shoulder    Vasopneumatic Pressure  Medium    Vasopneumatic Temperature   34      Manual Therapy   Soft tissue mobilization  deep tissue work through the pecs/anterior shoulder; upper trap/leveator; teres/posterior deltoid/triceps     Scapular Mobilization  Rt     Passive ROM  Rt shoulder flexion; scaption; extension; IR/ER               PT Short Term Goals - 06/13/19 0844      PT SHORT TERM GOAL #1   Title  I with initial HEP (06/01/2019)    Time  4    Status  Achieved    Target Date  06/01/19      PT SHORT  TERM GOAL #2   Title  demo Rt shoulder PROM WFL ( 06/01/2019)    Time  4    Period  Weeks    Status  On-going    Target Date  06/01/19      PT SHORT TERM GOAL #3   Title  assess strength ( 06/01/2019)    Time  4    Period  Weeks    Status  On-going    Target Date  06/01/19      PT SHORT TERM GOAL #4   Title  I with bathing and dressing ( 06/01/2019)    Time  4    Period  Weeks    Status  Achieved    Target Date  06/01/19        PT Long Term Goals - 06/29/19 0928      PT LONG TERM GOAL #1   Time  14    Period  Weeks    Status  Revised    Target Date  08/10/19      PT LONG TERM GOAL #2   Title  Increased AROM Rt shoulder to =/> AROM Lt shoulder with minimal to no pain    Time  14    Period  Weeks    Status  Revised    Target Date  08/10/19      PT LONG TERM GOAL #3   Title  dmeo Rt shoulder strength =/> 4+/5 to allow return to normal activity    Time  14    Period  Weeks    Status  Revised    Target Date  08/10/19      PT LONG TERM GOAL #4   Time  14    Period  Weeks    Status  Revised    Target Date  08/10/19      PT LONG TERM GOAL #5   Title  Improve FOTO to </= 44% limited    Time  14  Period  Weeks    Status  Revised    Target Date  08/10/19            Plan - 07/17/19 0933    Clinical Impression Statement  Patient continues to make gradual progress. Pain is now under better control. AROM and strength are improving. Functional activity level is increasing. Patient continues to have muscular tightness in the Rt shoulder girdle; limited IR; continued weakness. She will benefit from continued therapy to improve function and outcome following Rt TSA    Rehab Potential  Good    PT Frequency  2x / week    PT Duration  12 weeks    PT Treatment/Interventions  Iontophoresis 4mg /ml Dexamethasone;Taping;Vasopneumatic Device;Patient/family education;Moist Heat;Ultrasound;Scar mobilization;Passive range of motion;Therapeutic exercise;Cryotherapy;Electrical  Stimulation;Manual techniques;Dry needling    PT Next Visit Plan  continue to work on pain management - progressing exercise per protocol   gradually add shoulder extension/IR now at 3 months per protocol.    PT Home Exercise Plan  [id number]    Consulted and Agree with Plan of Care  Patient       Patient will benefit from skilled therapeutic intervention in order to improve the following deficits and impairments:     Visit Diagnosis: Acute pain of right shoulder  Stiffness of right shoulder, not elsewhere classified  Other symptoms and signs involving the musculoskeletal system  Abnormal posture     Problem List Patient Active Problem List   Diagnosis Date Noted  . 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 De Quervain's disease (tenosynovitis) 11/10/2018  . Shoulder pain, right 06/23/2018  . Lesion of mouth 12/23/2017  . Esophageal dysphagia 12/23/2017  . Mastocytosis 12/23/2017  . Vitiligo 12/23/2017  . Rosacea 12/23/2017  . Hypothyroidism 12/23/2017  . Anxiety and depression 12/23/2017    Kalyna Paolella Nilda Simmer PT, MPH  07/17/2019, 9:40 AM  [facility] [place], [place], [address] Phone: [phone]   Fax:  [phone]  Name: Kayla Lindsey MRN: [id number] Date of Birth: May 02, 1965

## 2019-07-18 ENCOUNTER — Encounter: Payer: Managed Care, Other (non HMO) | Admitting: Rehabilitative and Restorative Service Providers"

## 2019-07-20 ENCOUNTER — Encounter: Payer: Managed Care, Other (non HMO) | Admitting: Rehabilitative and Restorative Service Providers"

## 2019-07-25 ENCOUNTER — Other Ambulatory Visit: Payer: Self-pay

## 2019-07-25 ENCOUNTER — Ambulatory Visit (INDEPENDENT_AMBULATORY_CARE_PROVIDER_SITE_OTHER): Payer: Managed Care, Other (non HMO) | Admitting: Rehabilitative and Restorative Service Providers"

## 2019-07-25 ENCOUNTER — Encounter: Payer: Self-pay | Admitting: Rehabilitative and Restorative Service Providers"

## 2019-07-25 DIAGNOSIS — M25611 Stiffness of right shoulder, not elsewhere classified: Secondary | ICD-10-CM | POA: Diagnosis not present

## 2019-07-25 DIAGNOSIS — R29898 Other symptoms and signs involving the musculoskeletal system: Secondary | ICD-10-CM

## 2019-07-25 DIAGNOSIS — M25511 Pain in right shoulder: Secondary | ICD-10-CM

## 2019-07-25 DIAGNOSIS — R293 Abnormal posture: Secondary | ICD-10-CM | POA: Diagnosis not present

## 2019-07-25 NOTE — Therapy (Signed)
[place], [place], [address] Phone: [phone]   Fax:  [phone]  Physical Therapy Treatment  Patient Details  Name: Kayla Lindsey MRN: [id number] Date of Birth: 08/10/1965 Referring Provider (PT): Dr Ophelia Charter   Encounter Date: 07/25/2019  PT End of Session - 07/25/19 0807    Visit Number  22    Number of Visits  28    Date for PT Re-Evaluation  08/10/19    PT Start Time  0801    PT Stop Time  [id number]    PT Time Calculation (min)  53 min    Activity Tolerance  Patient tolerated treatment well       Past Medical History:  Diagnosis Date  . Anxiety   . Depression   . Hashimoto's thyroiditis    hypo  . Hypothyroidism   . Mastocytosis   . Osteoarthritis    right shoulder  . Rheumatoid arthritis ([place])    mgd by Dr Jillyn Ledger, Maralyn Sago   . Vitiligo     Past Surgical History:  Procedure Laterality Date  . APPENDECTOMY    . BREAST BIOPSY Bilateral   . BREAST LUMPECTOMY Right   . carpel tunnel Bilateral   . CHOLECYSTECTOMY    . HIP SURGERY Left 02/2007   laboral repair   . SHOULDER ACROMIOPLASTY Right   . TOTAL SHOULDER ARTHROPLASTY Right 04/20/2019   Procedure: TOTAL SHOULDER ARTHROPLASTY;  Surgeon: Hiram Gash, MD;  Location: [facility];  Service: Orthopedics;  Laterality: Right;    There were no vitals filed for this visit.  Subjective Assessment - 07/25/19 0808    Subjective  Mowed the lawn Friday and shoulder has been hurting since then. She has taken any medication for pain. She saw Dr Griffin Basil last week and he has released her with no restrictions.    Currently in Pain?  Yes    Pain Score  2     Pain Location  Shoulder    Pain Orientation  Right    Pain Descriptors / Indicators  Sore    Pain Type  Chronic pain    Pain Onset  More than a month ago    Pain Frequency  Intermittent    Aggravating Factors   using arm    Pain Relieving Factors  massage; ice;  rest                        [facility] Adult PT Treatment/Exercise - 07/25/19 0001      Therapeutic Activites    Therapeutic Activities  --   myofacial release posterior shoulder w/ ball     Shoulder Exercises: ROM/Strengthening   UBE (Upper Arm Bike)  L2 x 2 min fwd/2 min back       Shoulder Exercises: Stretch   Internal Rotation Stretch  2 reps   30 sec hold    Internal Rotation Stretch Limitations  horizontal adduction strap pulling Rt UE to Lt across buttocks       Vasopneumatic   Number Minutes Vasopneumatic   10 minutes    Vasopnuematic Location   Shoulder    Vasopneumatic Pressure  Medium    Vasopneumatic Temperature   34      Manual Therapy   Soft tissue mobilization  deep tissue work through the pecs/anterior shoulder; upper trap/leveator; teres/posterior deltoid/triceps     Scapular Mobilization  Rt     Passive ROM  Rt shoulder flexion; scaption;  extension; IR/ER; horizontal adduction              PT Education - 07/25/19 0832    Education Details  HEP    Person(s) Educated  Patient    Methods  Explanation;Demonstration;Tactile cues;Verbal cues;Handout    Comprehension  Verbalized understanding;Returned demonstration;Verbal cues required;Tactile cues required       PT Short Term Goals - 06/13/19 0844      PT SHORT TERM GOAL #1   Title  I with initial HEP (06/01/2019)    Time  4    Status  Achieved    Target Date  06/01/19      PT SHORT TERM GOAL #2   Title  demo Rt shoulder PROM WFL ( 06/01/2019)    Time  4    Period  Weeks    Status  On-going    Target Date  06/01/19      PT SHORT TERM GOAL #3   Title  assess strength ( 06/01/2019)    Time  4    Period  Weeks    Status  On-going    Target Date  06/01/19      PT SHORT TERM GOAL #4   Title  I with bathing and dressing ( 06/01/2019)    Time  4    Period  Weeks    Status  Achieved    Target Date  06/01/19        PT Long Term Goals - 06/29/19 0928      PT LONG TERM GOAL #1    Time  14    Period  Weeks    Status  Revised    Target Date  08/10/19      PT LONG TERM GOAL #2   Title  Increased AROM Rt shoulder to =/> AROM Lt shoulder with minimal to no pain    Time  14    Period  Weeks    Status  Revised    Target Date  08/10/19      PT LONG TERM GOAL #3   Title  dmeo Rt shoulder strength =/> 4+/5 to allow return to normal activity    Time  14    Period  Weeks    Status  Revised    Target Date  08/10/19      PT LONG TERM GOAL #4   Time  14    Period  Weeks    Status  Revised    Target Date  08/10/19      PT LONG TERM GOAL #5   Title  Improve FOTO to </= 44% limited    Time  14    Period  Weeks    Status  Revised    Target Date  08/10/19            Plan - 07/25/19 [id number]    Clinical Impression Statement  Continued progress with good report from MD. He has released patient without restrictions. Added corner stretch in 3 positions and continued with IR stretching. progressing with strengthening. Issued greeen TB for HEP.    Rehab Potential  Good    PT Frequency  2x / week    PT Duration  12 weeks    PT Treatment/Interventions  Iontophoresis 4mg /ml Dexamethasone;Taping;Vasopneumatic Device;Patient/family education;Moist Heat;Ultrasound;Scar mobilization;Passive range of motion;Therapeutic exercise;Cryotherapy;Electrical Stimulation;Manual techniques;Dry needling    PT Next Visit Plan  continue to work on pain management as needed  - progressing exercise per protocol   gradually add shoulder extension/IR  now at 3 months per protocol.    PT Home Exercise Plan  [id number]    Consulted and Agree with Plan of Care  Patient       Patient will benefit from skilled therapeutic intervention in order to improve the following deficits and impairments:     Visit Diagnosis: Acute pain of right shoulder  Stiffness of right shoulder, not elsewhere classified  Other symptoms and signs involving the musculoskeletal system  Abnormal posture     Problem  List Patient Active Problem List   Diagnosis Date Noted  . 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 De Quervain's disease (tenosynovitis) 11/10/2018  . Shoulder pain, right 06/23/2018  . Lesion of mouth 12/23/2017  . Esophageal dysphagia 12/23/2017  . Mastocytosis 12/23/2017  . Vitiligo 12/23/2017  . Rosacea 12/23/2017  . Hypothyroidism 12/23/2017  . Anxiety and depression 12/23/2017    Shermaine Rivet Nilda Simmer PT, MPH  07/25/2019, 8:48 AM  [facility] [place], [place], [address] Phone: [phone]   Fax:  [phone]  Name: AMAYIAH CARBARY MRN: [id number] Date of Birth: 1966/03/01

## 2019-07-25 NOTE — Patient Instructions (Signed)
Bow and arrow  Standing facing theraband pull one arm back as if you are pulling a bow stepping back with one foot Pause and return  Repeat 10 times red band  Repeat with opposite side    Strengthening: Resisted Extension   Hold tubing in right hand, arm forward. Pull arm back, elbow straight. Pull to hip only red band  Repeat _10___ times per set. Do 1___ sets per session. Do 1___ sessions per day.   Start in corner with 10-15 sec 2-3 reps  Can progress to doorway as tolerated   Scapula Adduction With Pectorals, Low   Stand in doorframe with palms against frame and arms at 45. Lean forward and squeeze shoulder blades. Hold ___ seconds. Repeat ___ times per session. Do ___ sessions per day.   Scapula Adduction With Pectorals, Mid-Range   Stand in doorframe with palms against frame and arms at 90. Lean forward and squeeze shoulder blades. Hold ___ seconds. Repeat ___ times per session. Do ___ sessions per day. \Scapula Adduction With Pectorals, High   Stand in doorframe with palms against frame and arms at 120. Lean forward and squeeze shoulder blades. Hold ___ seconds. Repeat ___ times per session. Do ___ sessions per day.  Internal Rotation Using strap - or robe belt    Stand with hands clasped behind back. Pull elbows back as far as possible. Hold __10__ seconds. Repeat _2-3___ times. Do __1-2__ sessions per day.

## 2019-07-27 ENCOUNTER — Encounter: Payer: Managed Care, Other (non HMO) | Admitting: Rehabilitative and Restorative Service Providers"

## 2019-07-28 ENCOUNTER — Encounter: Payer: Self-pay | Admitting: Rehabilitative and Restorative Service Providers"

## 2019-07-28 ENCOUNTER — Other Ambulatory Visit: Payer: Self-pay

## 2019-07-28 ENCOUNTER — Ambulatory Visit (INDEPENDENT_AMBULATORY_CARE_PROVIDER_SITE_OTHER): Payer: Managed Care, Other (non HMO) | Admitting: Rehabilitative and Restorative Service Providers"

## 2019-07-28 DIAGNOSIS — M25511 Pain in right shoulder: Secondary | ICD-10-CM

## 2019-07-28 DIAGNOSIS — R29898 Other symptoms and signs involving the musculoskeletal system: Secondary | ICD-10-CM

## 2019-07-28 DIAGNOSIS — M25611 Stiffness of right shoulder, not elsewhere classified: Secondary | ICD-10-CM | POA: Diagnosis not present

## 2019-07-28 DIAGNOSIS — R293 Abnormal posture: Secondary | ICD-10-CM

## 2019-07-28 NOTE — Therapy (Signed)
[place], [place], [address] Phone: [phone]   Fax:  [phone]  Physical Therapy Treatment  Patient Details  Name: Kayla Lindsey MRN: [id number] Date of Birth: Feb 18, 1966 Referring Provider (PT): Dr Ophelia Charter   Encounter Date: 07/28/2019  PT End of Session - 07/28/19 1105    Visit Number  23    Number of Visits  28    Date for PT Re-Evaluation  08/10/19    PT Start Time  1016    PT Stop Time  1102    PT Time Calculation (min)  46 min    Activity Tolerance  Patient tolerated treatment well       Past Medical History:  Diagnosis Date  . Anxiety   . Depression   . Hashimoto's thyroiditis    hypo  . Hypothyroidism   . Mastocytosis   . Osteoarthritis    right shoulder  . Rheumatoid arthritis ([place])    mgd by Dr Jillyn Ledger, Maralyn Sago   . Vitiligo     Past Surgical History:  Procedure Laterality Date  . APPENDECTOMY    . BREAST BIOPSY Bilateral   . BREAST LUMPECTOMY Right   . carpel tunnel Bilateral   . CHOLECYSTECTOMY    . HIP SURGERY Left 02/2007   laboral repair   . SHOULDER ACROMIOPLASTY Right   . TOTAL SHOULDER ARTHROPLASTY Right 04/20/2019   Procedure: TOTAL SHOULDER ARTHROPLASTY;  Surgeon: Hiram Gash, MD;  Location: [facility];  Service: Orthopedics;  Laterality: Right;    There were no vitals filed for this visit.  Subjective Assessment - 07/28/19 1020    Subjective  The patient reports some soreness after the new exercises.  She does not have as much pain at night.  She reports she fatigues quickly with arms above her head.  Reaching across into horizontal adduction is hard during ADLs (deodorant or shaving the opposite underarm).    Patient Stated Goals  use Rt arm again    Currently in Pain?  No/denies                        [facility] Adult PT Treatment/Exercise - 07/28/19 1027      Exercises   Exercises  Shoulder      Shoulder  Exercises: Standing   Horizontal ABduction  Right    Horizontal ABduction Limitations  pain with cross body AROM    Flexion  AROM;Right;5 reps    Flexion Limitations  cues for scapular stabilization during AROM    Other Standing Exercises  Ball roll on wall at 60 degrees and overhead    Other Standing Exercises  elbows/ short arm flexion WB through door with horizontal abduction within tolerable range-- patient has some shoulder elevation and therefore, discontinued this exercise; worked on scapular protraction/retraction through elbow lean on wall      Shoulder Exercises: Therapy Copywriter, advertising Exercises  R UE resting on ball (on mat table), then moving laterally      Shoulder Exercises: ROM/Strengthening   UBE (Upper Arm Bike)  L2: 2 minutes forward/ 1 minute backwards      Shoulder Exercises: Isometric Strengthening   ABduction  5X5"    ABduction Limitations  into ball at wall at 45 degrees abduction    Other Isometric Exercises  overhead with ball on doorframe and 5 x 5 second holds      Shoulder Exercises: Stretch  Corner Stretch  2 reps;20 seconds    Internal Rotation Stretch Limitations  PROM with AROM and PROM into stretch      Modalities   Modalities  Vasopneumatic      Vasopneumatic   Number Minutes Vasopneumatic   10 minutes    Vasopnuematic Location   Shoulder    Vasopneumatic Pressure  Medium    Vasopneumatic Temperature   34      Manual Therapy   Manual Therapy  Soft tissue mobilization;Joint mobilization;Passive ROM    Manual therapy comments  to reduce muscle tightness and inhibit anterior muscle firing    Joint Mobilization  gentle manual distraction    Soft tissue mobilization  deep tissue work R pecs, anterior deltoid    Passive ROM  PROM end range abduction and flexion                PT Short Term Goals - 06/13/19 0844      PT SHORT TERM GOAL #1   Title  I with initial HEP (06/01/2019)    Time  4    Status  Achieved    Target Date   06/01/19      PT SHORT TERM GOAL #2   Title  demo Rt shoulder PROM WFL ( 06/01/2019)    Time  4    Period  Weeks    Status  On-going    Target Date  06/01/19      PT SHORT TERM GOAL #3   Title  assess strength ( 06/01/2019)    Time  4    Period  Weeks    Status  On-going    Target Date  06/01/19      PT SHORT TERM GOAL #4   Title  I with bathing and dressing ( 06/01/2019)    Time  4    Period  Weeks    Status  Achieved    Target Date  06/01/19        PT Long Term Goals - 06/29/19 0928      PT LONG TERM GOAL #1   Time  14    Period  Weeks    Status  Revised    Target Date  08/10/19      PT LONG TERM GOAL #2   Title  Increased AROM Rt shoulder to =/> AROM Lt shoulder with minimal to no pain    Time  14    Period  Weeks    Status  Revised    Target Date  08/10/19      PT LONG TERM GOAL #3   Title  dmeo Rt shoulder strength =/> 4+/5 to allow return to normal activity    Time  14    Period  Weeks    Status  Revised    Target Date  08/10/19      PT LONG TERM GOAL #4   Time  14    Period  Weeks    Status  Revised    Target Date  08/10/19      PT LONG TERM GOAL #5   Title  Improve FOTO to </= 44% limited    Time  14    Period  Weeks    Status  Revised    Target Date  08/10/19            Plan - 07/28/19 1106    Clinical Impression Statement  The patient has significant reduction in muscle guarding with manual work.  PT  recommended she perform ball roll for anterior shoulder to have a way to reduce muscle firing after use of R UE during chores.  Plan to continue to [place] working to increase shoulder strength to improve functional use.    Rehab Potential  Good    PT Frequency  2x / week    PT Duration  12 weeks    PT Treatment/Interventions  Iontophoresis 4mg /ml Dexamethasone;Taping;Vasopneumatic Device;Patient/family education;Moist Heat;Ultrasound;Scar mobilization;Passive range of motion;Therapeutic exercise;Cryotherapy;Electrical Stimulation;Manual  techniques;Dry needling    PT Next Visit Plan  continue to work on pain management as needed  - progressing exercise per protocol   gradually add shoulder extension/IR now at 3 months per protocol.    PT Home Exercise Plan  [id number]    Consulted and Agree with Plan of Care  Patient       Patient will benefit from skilled therapeutic intervention in order to improve the following deficits and impairments:     Visit Diagnosis: Acute pain of right shoulder  Stiffness of right shoulder, not elsewhere classified  Other symptoms and signs involving the musculoskeletal system  Abnormal posture     Problem List Patient Active Problem List   Diagnosis Date Noted  . Infection of prosthetic shoulder joint ([place]) 05/12/2019  . De Quervain's disease (tenosynovitis) 11/10/2018  . Shoulder pain, right 06/23/2018  . Lesion of mouth 12/23/2017  . Esophageal dysphagia 12/23/2017  . Mastocytosis 12/23/2017  . Vitiligo 12/23/2017  . Rosacea 12/23/2017  . Hypothyroidism 12/23/2017  . Anxiety and depression 12/23/2017    Watson, PT 07/28/2019, 11:08 AM  [facility] [place], [place], [address] Phone: [phone]   Fax:  [phone]  Name: MAKEESHA REFFNER MRN: [id number] Date of Birth: Jun 15, 1965

## 2019-07-31 ENCOUNTER — Ambulatory Visit: Payer: Managed Care, Other (non HMO) | Admitting: Family

## 2019-08-03 ENCOUNTER — Encounter: Payer: Self-pay | Admitting: Rehabilitative and Restorative Service Providers"

## 2019-08-03 ENCOUNTER — Ambulatory Visit (INDEPENDENT_AMBULATORY_CARE_PROVIDER_SITE_OTHER): Payer: Managed Care, Other (non HMO) | Admitting: Rehabilitative and Restorative Service Providers"

## 2019-08-03 ENCOUNTER — Other Ambulatory Visit: Payer: Self-pay

## 2019-08-03 ENCOUNTER — Encounter: Payer: Self-pay | Admitting: Family

## 2019-08-03 ENCOUNTER — Ambulatory Visit (INDEPENDENT_AMBULATORY_CARE_PROVIDER_SITE_OTHER): Payer: Managed Care, Other (non HMO) | Admitting: Family

## 2019-08-03 DIAGNOSIS — R293 Abnormal posture: Secondary | ICD-10-CM | POA: Diagnosis not present

## 2019-08-03 DIAGNOSIS — M25611 Stiffness of right shoulder, not elsewhere classified: Secondary | ICD-10-CM | POA: Diagnosis not present

## 2019-08-03 DIAGNOSIS — T8459XD Infection and inflammatory reaction due to other internal joint prosthesis, subsequent encounter: Secondary | ICD-10-CM

## 2019-08-03 DIAGNOSIS — Z96619 Presence of unspecified artificial shoulder joint: Secondary | ICD-10-CM

## 2019-08-03 DIAGNOSIS — R29898 Other symptoms and signs involving the musculoskeletal system: Secondary | ICD-10-CM | POA: Diagnosis not present

## 2019-08-03 DIAGNOSIS — M25511 Pain in right shoulder: Secondary | ICD-10-CM

## 2019-08-03 NOTE — Assessment & Plan Note (Signed)
Kayla Lindsey continues to receive treatment for [place] right prosthetic shoulder joint infection and has been making positive progress with no current evidence of infection. Discussed the plan of care to complete 3 months of doxycycline. Based on lateness of culture identification and 1/3 positive cultures 3 months certainly is a reasonable treatment at this point. Continue current dose of doxycycline until completed. Plan for follow up in 2 months or sooner if needed.

## 2019-08-03 NOTE — Patient Instructions (Signed)
Nice to see you.  Continue to take your doxycycline as prescribed twice daily.  Plan for follow up in 2 months or sooner if needed.  Have a great day and stay safe!

## 2019-08-03 NOTE — Therapy (Signed)
[place], [place], [address] Phone: [phone]   Fax:  [phone]  Physical Therapy Treatment  Patient Details  Name: Kayla Lindsey MRN: [id number] Date of Birth: [birth year] Referring Provider (PT): Dr Ophelia Charter   Encounter Date: 08/03/2019  PT End of Session - 08/03/19 0851    Visit Number  24    Number of Visits  28    Date for PT Re-Evaluation  08/10/19    PT Start Time  0805    PT Stop Time  [id number]    PT Time Calculation (min)  49 min    Activity Tolerance  Patient tolerated treatment well       Past Medical History:  Diagnosis Date  . Anxiety   . Depression   . Hashimoto's thyroiditis    hypo  . Hypothyroidism   . Mastocytosis   . Osteoarthritis    right shoulder  . Rheumatoid arthritis ([place])    mgd by Dr Jillyn Ledger, Maralyn Sago   . Vitiligo     Past Surgical History:  Procedure Laterality Date  . APPENDECTOMY    . BREAST BIOPSY Bilateral   . BREAST LUMPECTOMY Right   . carpel tunnel Bilateral   . CHOLECYSTECTOMY    . HIP SURGERY Left 02/2007   laboral repair   . SHOULDER ACROMIOPLASTY Right   . TOTAL SHOULDER ARTHROPLASTY Right 04/20/2019   Procedure: TOTAL SHOULDER ARTHROPLASTY;  Surgeon: Hiram Gash, MD;  Location: [place] [facility];  Service: Orthopedics;  Laterality: Right;    There were no vitals filed for this visit.  Subjective Assessment - 08/03/19 0808    Subjective  The patient reports she "jammed up" her shoulder while driving over the weekend to [facility].  She drove home yesterday and notes increased tightness.  She felt great after therapy last week noting the shoulder was loose.    Patient Stated Goals  use Rt arm again    Currently in Pain?  No/denies    Pain Score  0-No pain                        [facility] Adult PT Treatment/Exercise - 08/03/19 0808      Exercises   Exercises  Shoulder      Shoulder Exercises: Prone    Retraction  Strengthening;Right;5 reps    Retraction Limitations  with cues for scapular motion and reducing shoulder shrug    Flexion  Strengthening;Right;12 reps    Flexion Limitations  through ROM that patient can perform without shrug    Extension  Strengthening;Right;10 reps    Extension Limitations  with cues on scapular retraction    Horizontal ABduction 1  Strengthening;Right;12 reps    Horizontal ABduction 1 Limitations  arm at neutral    Horizontal ABduction 2  Strengthening;Right;12 reps    Horizontal ABduction 2 Limitations  arm at 90 with cues for shoulder positioning      Shoulder Exercises: Sidelying   External Rotation  Strengthening;Right;10 reps    Internal Rotation  PROM;AROM;Right    Internal Rotation Limitations  tightness in anterior shoulder; reaches to middle of sacrum in SL    Flexion  PROM;5 reps    ABduction  PROM;Right    ABduction Limitations  with end range stretching      Shoulder Exercises: Standing   Protraction  Strengthening;Both;10 reps    Protraction Limitations  elbow lean on wall with scapular protraction/retraction; then performed with  arms extended      Shoulder Exercises: ROM/Strengthening   UBE (Upper Arm Bike)  L2:  2 minutes forward/ 1 minute backward      Modalities   Modalities  Vasopneumatic      Vasopneumatic   Number Minutes Vasopneumatic   10 minutes    Vasopnuematic Location   Shoulder    Vasopneumatic Pressure  Medium    Vasopneumatic Temperature   34      Manual Therapy   Manual Therapy  Joint mobilization;Soft tissue mobilization;Myofascial release;Passive ROM;Scapular mobilization    Manual therapy comments  to reduce tightness from travel and reduce pain ("positioning soreness")    Joint Mobilization  manual distraction grade II, GH superior ot inferior glide grade II,     Soft tissue mobilization  STM and IASTM to R biceps tendon, R pec, middle deltoid, rhomboids.    Myofascial Release  R pectoralis    Scapular  Mobilization  sidelying PROm scapular mobility    Passive ROM  R shoulder end range flexion and abduction               PT Short Term Goals - 06/13/19 0844      PT SHORT TERM GOAL #1   Title  I with initial HEP (06/01/2019)    Time  4    Status  Achieved    Target Date  06/01/19      PT SHORT TERM GOAL #2   Title  demo Rt shoulder PROM WFL ( 06/01/2019)    Time  4    Period  Weeks    Status  On-going    Target Date  06/01/19      PT SHORT TERM GOAL #3   Title  assess strength ( 06/01/2019)    Time  4    Period  Weeks    Status  On-going    Target Date  06/01/19      PT SHORT TERM GOAL #4   Title  I with bathing and dressing ( 06/01/2019)    Time  4    Period  Weeks    Status  Achieved    Target Date  06/01/19        PT Long Term Goals - 06/29/19 0928      PT LONG TERM GOAL #1   Time  14    Period  Weeks    Status  Revised    Target Date  08/10/19      PT LONG TERM GOAL #2   Title  Increased AROM Rt shoulder to =/> AROM Lt shoulder with minimal to no pain    Time  14    Period  Weeks    Status  Revised    Target Date  08/10/19      PT LONG TERM GOAL #3   Title  dmeo Rt shoulder strength =/> 4+/5 to allow return to normal activity    Time  14    Period  Weeks    Status  Revised    Target Date  08/10/19      PT LONG TERM GOAL #4   Time  14    Period  Weeks    Status  Revised    Target Date  08/10/19      PT LONG TERM GOAL #5   Title  Improve FOTO to </= 44% limited    Time  14    Period  Weeks    Status  Revised  Target Date  08/10/19            Plan - 08/03/19 0851    Clinical Impression Statement  The patient arrived today without pain, but notes significant tightness after driving to [place] for a family visit.  Initially, she uses shoulder shrug + thoracic extension for end range motion.  After soft tissue mobilization, IASTM, and scapular strengthening she is able to demonstrate symmetric shoulder ROM (compared to left)  without tightness noted.    Rehab Potential  Good    PT Frequency  2x / week    PT Duration  12 weeks    PT Treatment/Interventions  Iontophoresis 4mg /ml Dexamethasone;Taping;Vasopneumatic Device;Patient/family education;Moist Heat;Ultrasound;Scar mobilization;Passive range of motion;Therapeutic exercise;Cryotherapy;Electrical Stimulation;Manual techniques;Dry needling    PT Next Visit Plan  Patient has TPs in R teres and rhomboids today that improve with STM (assess to determine if needs DN), prone strengthening, loading through wall to tolerance and progressing IR. middle deltoid isometrics    PT Home Exercise Plan  [id number]    Consulted and Agree with Plan of Care  Patient       Patient will benefit from skilled therapeutic intervention in order to improve the following deficits and impairments:  Decreased range of motion, Impaired UE functional use, Increased muscle spasms, Pain, Increased edema, Decreased strength  Visit Diagnosis: Acute pain of right shoulder  Stiffness of right shoulder, not elsewhere classified  Other symptoms and signs involving the musculoskeletal system  Abnormal posture     Problem List Patient Active Problem List   Diagnosis Date Noted  . Infection of prosthetic shoulder joint ([place]) 05/12/2019  . De Quervain's disease (tenosynovitis) 11/10/2018  . Shoulder pain, right 06/23/2018  . Lesion of mouth 12/23/2017  . Esophageal dysphagia 12/23/2017  . Mastocytosis 12/23/2017  . Vitiligo 12/23/2017  . Rosacea 12/23/2017  . Hypothyroidism 12/23/2017  . Anxiety and depression 12/23/2017    Choice Kleinsasser, PT 08/03/2019, 9:09 AM  [facility] [place], [place], [address] Phone: [phone]   Fax:  [phone]  Name: Kayla Lindsey MRN: [id number] Date of Birth: Apr 25, 1965

## 2019-08-03 NOTE — Progress Notes (Signed)
Subjective:    Patient ID: Kayla Lindsey, female    DOB: [birth year], 54 y.o.   MRN: [id number]  Chief Complaint  Patient presents with  . Follow-up    no complaints; reports minor soreness;      HPI:  Kayla Lindsey is a 54 y.o. 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  Kayla Lindsey has been taking her doxycycline as prescribed with no adverse side effects or missed doses. Continues to have weakness and cramping of her right shoulder at times. Working with physical therapy through exercise and dry needling. Has had no systemic symptoms or significant worsening pain or swelling.    Allergies  Allergen Reactions  . Tape Rash    Redness      Outpatient Medications Prior to Visit  Medication Sig Dispense Refill  . doxycycline (VIBRAMYCIN) 100 MG capsule Take 1 capsule (100 mg total) by mouth 2 (two) times daily. 60 capsule 2  . acetaminophen (TYLENOL) 500 MG tablet Take 1,000 mg by mouth every 6 (six) hours as needed for moderate pain or headache.    . albuterol (VENTOLIN HFA) 108 (90 Base) MCG/ACT inhaler Inhale 2 puffs into the lungs every 4 (four) hours as needed for wheezing or shortness of breath.    Kayla Lindsey 5 MG tablet Take 5 mg by mouth at bedtime as needed for sleep.     . Azelaic Acid 15 % cream Apply 1 application topically 2 (two) times daily. After skin is thoroughly washed and patted dry 50 g 11  . Biotin 10000 MCG TABS Take 10,000 mcg by mouth daily.     Marland Kitchen buPROPion (WELLBUTRIN) 75 MG tablet Take 75 mg by mouth daily.     . cetirizine (ZYRTEC) 10 MG tablet Take 10 mg by mouth daily.    Marland Kitchen EPINEPHrine (EPIPEN 2-PAK) 0.3 mg/0.3 mL IJ SOAJ injection Inject 0.3 mg into the muscle as needed for anaphylaxis.    . folic acid (FOLVITE) [id number] MCG tablet Take 800 mcg by  mouth daily.     . hydroxychloroquine (PLAQUENIL) 200 MG tablet Take 200 mg by mouth 2 (two) times daily.     Marland Kitchen levothyroxine (SYNTHROID) 112 MCG tablet Take 1 tablet (112 mcg total) by mouth daily before breakfast. (Patient not taking: Reported on 05/15/2019) 90 tablet 0  . levothyroxine (SYNTHROID) 125 MCG tablet Take 125 mcg by mouth daily.    . meloxicam (MOBIC) 7.5 MG tablet Take 1 tablet (7.5 mg total) by mouth daily. 30 tablet 2  . predniSONE (DELTASONE) 5 MG tablet Take 5 mg by mouth daily.    . Sulfacetamide Sodium 10 % LIQD APPLY TO FACE TOPICALLY TWICE A DAY AS FACE [place]. (Patient taking differently: Apply 1 application topically in the morning and at bedtime. ) 177 mL 1  . traMADol (ULTRAM) 50 MG tablet Take 50 mg by mouth every 6 (six) hours as needed for moderate pain.     Marland Kitchen venlafaxine XR (EFFEXOR-XR) 150 MG 24 hr capsule Take 1 capsule (150 mg total) by mouth daily with breakfast. 90 capsule 1   No facility-administered medications prior to visit.     Past Medical History:  Diagnosis Date  . Anxiety   . Depression   . Hashimoto's thyroiditis    hypo  . Hypothyroidism   .  Mastocytosis   . Osteoarthritis    right shoulder  . Rheumatoid arthritis ([place])    mgd by Dr Jillyn Ledger, Maralyn Sago   . Vitiligo      Past Surgical History:  Procedure Laterality Date  . APPENDECTOMY    . BREAST BIOPSY Bilateral   . BREAST LUMPECTOMY Right   . carpel tunnel Bilateral   . CHOLECYSTECTOMY    . HIP SURGERY Left 02/2007   laboral repair   . SHOULDER ACROMIOPLASTY Right   . TOTAL SHOULDER ARTHROPLASTY Right 04/20/2019   Procedure: TOTAL SHOULDER ARTHROPLASTY;  Surgeon: Hiram Gash, MD;  Location: [facility];  Service: Orthopedics;  Laterality: Right;       Review of Systems  Constitutional: Negative for chills, diaphoresis, fatigue and fever.  Respiratory: Negative for cough, chest tightness, shortness of breath and wheezing.   Cardiovascular: Negative for chest  pain.  Gastrointestinal: Negative for abdominal pain, diarrhea, nausea and vomiting.      Objective:    BP 121/83   Pulse 84   Wt 189 lb (85.7 kg)   BMI 29.60 kg/m  Nursing note and vital signs reviewed.  Physical Exam Constitutional:      General: She is not in acute distress.    Appearance: She is well-developed.  Cardiovascular:     Rate and Rhythm: Normal rate and regular rhythm.     Heart sounds: Normal heart sounds.  Pulmonary:     Effort: Pulmonary effort is normal.     Breath sounds: Normal breath sounds.  Skin:    General: Skin is warm and dry.  Neurological:     Mental Status: She is alert and oriented to person, place, and time.  Psychiatric:        Behavior: Behavior normal.        Thought Content: Thought content normal.        Judgment: Judgment normal.      Depression screen [facility] 2/9 08/03/2019 05/12/2019 09/13/2018 01/20/2018 12/23/2017  Decreased Interest 0 0 1 3 3   Down, Depressed, Hopeless 1 0 2 2 3   PHQ - 2 Score 1 0 3 5 6   Altered sleeping - - 2 2 3   Tired, decreased energy - - 3 3 3   Change in appetite - - 2 0 0  Feeling bad or failure about yourself  - - 2 2 3   Trouble concentrating - - 3 3 3   Moving slowly or fidgety/restless - - 1 0 2  Suicidal thoughts - - 1 0 0  PHQ-9 Score - - 17 15 20   Difficult doing work/chores - - Somewhat difficult Very difficult Very difficult       Assessment & Plan:    Patient Active Problem List   Diagnosis Date Noted  . Infection of prosthetic shoulder joint ([place]) 05/12/2019  . De Quervain's disease (tenosynovitis) 11/10/2018  . Shoulder pain, right 06/23/2018  . Lesion of mouth 12/23/2017  . Esophageal dysphagia 12/23/2017  . Mastocytosis 12/23/2017  . Vitiligo 12/23/2017  . Rosacea 12/23/2017  . Hypothyroidism 12/23/2017  . Anxiety and depression 12/23/2017     Problem List Items Addressed This Visit    None       I am having Kayla Lindsey. Kayla "Kim" maintain her venlafaxine XR,  levothyroxine, Azelaic Acid, Sulfacetamide Sodium, hydroxychloroquine, buPROPion, folic acid, Biotin, meloxicam, traMADol, levothyroxine, Ambien, predniSONE, cetirizine, EPINEPHrine, albuterol, acetaminophen, and doxycycline.   No orders of the defined types were placed in this encounter.    Follow-up: Return in  about 2 months (around 10/03/2019), or if symptoms worsen or fail to improve.   Terri Piedra, MSN, FNP-C Nurse Practitioner [facility] for Infectious Disease [place] number: [phone]

## 2019-08-08 ENCOUNTER — Encounter: Payer: Managed Care, Other (non HMO) | Admitting: Rehabilitative and Restorative Service Providers"

## 2019-08-10 ENCOUNTER — Other Ambulatory Visit: Payer: Self-pay

## 2019-08-10 ENCOUNTER — Encounter: Payer: Self-pay | Admitting: Rehabilitative and Restorative Service Providers"

## 2019-08-10 ENCOUNTER — Ambulatory Visit (INDEPENDENT_AMBULATORY_CARE_PROVIDER_SITE_OTHER): Payer: Managed Care, Other (non HMO) | Admitting: Rehabilitative and Restorative Service Providers"

## 2019-08-10 DIAGNOSIS — R293 Abnormal posture: Secondary | ICD-10-CM

## 2019-08-10 DIAGNOSIS — M25611 Stiffness of right shoulder, not elsewhere classified: Secondary | ICD-10-CM

## 2019-08-10 DIAGNOSIS — M25511 Pain in right shoulder: Secondary | ICD-10-CM | POA: Diagnosis not present

## 2019-08-10 DIAGNOSIS — R29898 Other symptoms and signs involving the musculoskeletal system: Secondary | ICD-10-CM | POA: Diagnosis not present

## 2019-08-10 NOTE — Therapy (Addendum)
[place], [place], [address] Phone: [phone]   Fax:  [phone]  Physical Therapy Treatment  Patient Details  Name: Kayla Lindsey MRN: [id number] Date of Birth: [birth year] Referring Provider (PT): Dr Ophelia Charter   Encounter Date: 08/10/2019  PT End of Session - 08/10/19 0802    Visit Number  25    Number of Visits  28    Date for PT Re-Evaluation  08/10/19    Authorization Type  [employer]    PT Start Time  0801    PT Stop Time  0851    PT Time Calculation (min)  50 min    Activity Tolerance  Patient tolerated treatment well       Past Medical History:  Diagnosis Date  . Anxiety   . Depression   . Hashimoto's thyroiditis    hypo  . Hypothyroidism   . Mastocytosis   . Osteoarthritis    right shoulder  . Rheumatoid arthritis ([place])    mgd by Dr Jillyn Ledger, Maralyn Sago   . Vitiligo     Past Surgical History:  Procedure Laterality Date  . APPENDECTOMY    . BREAST BIOPSY Bilateral   . BREAST LUMPECTOMY Right   . carpel tunnel Bilateral   . CHOLECYSTECTOMY    . HIP SURGERY Left 02/2007   laboral repair   . SHOULDER ACROMIOPLASTY Right   . TOTAL SHOULDER ARTHROPLASTY Right 04/20/2019   Procedure: TOTAL SHOULDER ARTHROPLASTY;  Surgeon: Hiram Gash, MD;  Location: [facility];  Service: Orthopedics;  Laterality: Right;    There were no vitals filed for this visit.  Subjective Assessment - 08/10/19 0803    Subjective  Patient reports that she was doing a lot of driving. Doing okay. Something "popped" Monday and she has felt great since then.    Currently in Pain?  No/denies                        [facility] Adult PT Treatment/Exercise - 08/10/19 0001      Shoulder Exercises: Standing   Other Standing Exercises  wall push ups x 10; wall plank 30 sec x 3       Shoulder Exercises: ROM/Strengthening   UBE (Upper Arm Bike)  L3:  2 minutes forward/ 2 minute  backward/1 min forward       Shoulder Exercises: Stretch   Other Shoulder Stretches  doorway stretch with arms at 45 deg x 30 sec x 2 reps; 90 deg x 30 sec x 2 reps; 120 deg x 30 sec x 2 reps        Moist Heat Therapy   Number Minutes Moist Heat  10 Minutes    Moist Heat Location  Shoulder;Cervical   thoracic area      Manual Therapy   Manual therapy comments  skilled palpation to assess response to tissue with DN and manual work     Soft tissue mobilization  deep tissue work through the medial scapular area - traps/leveator/rhomboids     Myofascial Release  medial thoracic/scapular area     Scapular Mobilization  Rt        Trigger Point Dry Needling - 08/10/19 0001    Consent Given?  Yes    Education Handout Provided  Previously provided    Other Dry Needling  Rt only     Levator Scapulae Response  Palpable increased muscle length    Rhomboids Response  Palpable increased muscle length    Thoracic multifidi response  Palpable increased muscle length           PT Education - 08/10/19 0848    Education Details  HEP    Person(s) Educated  Patient    Methods  Explanation;Demonstration;Tactile cues;Verbal cues;Handout    Comprehension  Verbalized understanding;Returned demonstration;Verbal cues required;Tactile cues required       PT Short Term Goals - 06/13/19 0844      PT SHORT TERM GOAL #1   Title  I with initial HEP (06/01/2019)    Time  4    Status  Achieved    Target Date  06/01/19      PT SHORT TERM GOAL #2   Title  demo Rt shoulder PROM WFL ( 06/01/2019)    Time  4    Period  Weeks    Status  On-going    Target Date  06/01/19      PT SHORT TERM GOAL #3   Title  assess strength ( 06/01/2019)    Time  4    Period  Weeks    Status  On-going    Target Date  06/01/19      PT SHORT TERM GOAL #4   Title  I with bathing and dressing ( 06/01/2019)    Time  4    Period  Weeks    Status  Achieved    Target Date  06/01/19        PT Long Term Goals -  06/29/19 0928      PT LONG TERM GOAL #1   Time  14    Period  Weeks    Status  Revised    Target Date  08/10/19      PT LONG TERM GOAL #2   Title  Increased AROM Rt shoulder to =/> AROM Lt shoulder with minimal to no pain    Time  14    Period  Weeks    Status  Revised    Target Date  08/10/19      PT LONG TERM GOAL #3   Title  dmeo Rt shoulder strength =/> 4+/5 to allow return to normal activity    Time  14    Period  Weeks    Status  Revised    Target Date  08/10/19      PT LONG TERM GOAL #4   Time  14    Period  Weeks    Status  Revised    Target Date  08/10/19      PT LONG TERM GOAL #5   Title  Improve FOTO to </= 44% limited    Time  14    Period  Weeks    Status  Revised    Target Date  08/10/19            Plan - 08/10/19 0818    Clinical Impression Statement  Continued improvement with decreased pain and improving functional activities using Rt UE. good response to DN/manual work through the Rt  medial scapular area. Will try independent HEP and schedule in 2 weeks    Rehab Potential  Good    PT Frequency  2x / week    PT Duration  12 weeks    PT Treatment/Interventions  Iontophoresis '4mg'$ /ml Dexamethasone;Taping;Vasopneumatic Device;Patient/family education;Moist Heat;Ultrasound;Scar mobilization;Passive range of motion;Therapeutic exercise;Cryotherapy;Electrical Stimulation;Manual techniques;Dry needling    PT Next Visit Plan  patient will work on independent HEP and schedule for follow up  in 2 weeks    PT Home Exercise Plan  [email or web address]    Consulted and Agree with Plan of Care  Patient       Patient will benefit from skilled therapeutic intervention in order to improve the following deficits and impairments:     Visit Diagnosis: Acute pain of right shoulder  Stiffness of right shoulder, not elsewhere classified  Other symptoms and signs involving the musculoskeletal system  Abnormal posture     Problem List Patient Active Problem List    Diagnosis Date Noted  . 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 De Quervain's disease (tenosynovitis) 11/10/2018  . Shoulder pain, right 06/23/2018  . Lesion of mouth 12/23/2017  . Esophageal dysphagia 12/23/2017  . Mastocytosis 12/23/2017  . Vitiligo 12/23/2017  . Rosacea 12/23/2017  . Hypothyroidism 12/23/2017  . Anxiety and depression 12/23/2017    Aimar Borghi Nilda Simmer PT, MPH  08/10/2019, 8:52 AM  [facility] [place], [place], [address] Phone: [phone]   Fax:  [phone]  Name: BRITTNEE GAETANO MRN: [id number] Date of Birth: Jan 20, 1966  PHYSICAL THERAPY DISCHARGE SUMMARY  Visits from Start of Care: 25  Current functional level related to goals / functional outcomes: See progress note for discharge status    Remaining deficits: No known deficits - needs to continue with HEP/strengthening    Education / Equipment: HEP   Plan: Patient agrees to discharge.  Patient goals were met. Patient is being discharged due to meeting the stated rehab goals.  ?????    Namine Beahm P. Helene Kelp PT, MPH 09/14/19 9:56 AM

## 2019-08-10 NOTE — Patient Instructions (Signed)
Access Code: [email or web address]: [email or web address]: 06/03/2021Prepared by: Kodi Guerrera HoltExercises  Standing Scapular Retraction - 5-6 x daily - 3-5 reps - 5 hold  Standing Backward Shoulder Rolls - 5-6 x daily - 5 reps  Circular Shoulder Pendulum with Table Support - 5-6 x daily - 10 reps  Standing 'L' Stretch at Counter - 5-6 x daily - 5 reps  Isometric Shoulder Flexion at Wall - 2 x daily - 7 x weekly - 10 reps - 1 sets - 5 hold  Isometric Shoulder External Rotation at Wall - 2 x daily - 7 x weekly - 10 reps - 1 sets - 5 hold  Standing Isometric Shoulder Abduction with Doorway - Arm Bent - 2 x daily - 7 x weekly - 1 sets - 10 reps - 5 hold  L's - 1 x daily - 7 x weekly - 1 sets - 5-10 reps - 3-5 hold  Standing Shoulder Flexion to 90 Degrees - 1 x daily - 7 x weekly - 1 sets - 5-10 reps  Standing Shoulder Extension - 2 x daily - 7 x weekly - 3 reps - 1 sets - 30 sec hold  Shoulder External Rotation and Scapular Retraction with Resistance - 2 x daily - 7 x weekly - 1 sets - 10 reps - 2sec hold  Standing Shoulder Internal Rotation Stretch with Hands Behind Back - 2 x daily - 7 x weekly - 1 sets - 3 reps - 10 sec hold  Supine Scapular Protraction in Flexion with Dumbbells - 2 x daily - 7 x weekly - 1 sets - 10 reps - 0 hold  Wall Push Up - 2 x daily - 7 x weekly - 3 reps - 1 sets - 30 sec hold  Standing Plank on Wall - 2 x daily - 7 x weekly - 3 reps - 1 sets - 30 sec hold  Plank on Counter - 2 x daily - 7 x weekly - 3 reps - 1 sets - 30 sec hold

## 2019-09-18 ENCOUNTER — Other Ambulatory Visit: Payer: Self-pay | Admitting: Family

## 2019-09-28 ENCOUNTER — Ambulatory Visit (INDEPENDENT_AMBULATORY_CARE_PROVIDER_SITE_OTHER): Payer: Managed Care, Other (non HMO) | Admitting: Family

## 2019-09-28 ENCOUNTER — Encounter: Payer: Self-pay | Admitting: Family

## 2019-09-28 ENCOUNTER — Other Ambulatory Visit: Payer: Self-pay

## 2019-09-28 DIAGNOSIS — T8459XD Infection and inflammatory reaction due to other internal joint prosthesis, subsequent encounter: Secondary | ICD-10-CM | POA: Diagnosis not present

## 2019-09-28 NOTE — Patient Instructions (Signed)
Nice to see you.  We will complete your doxycyline with this round.   Continue to monitor for pain, redness, or swelling.   Follow up with ID as needed.

## 2019-09-28 NOTE — Progress Notes (Signed)
Subjective:    Patient ID: Kayla Lindsey, female    DOB: 10-30-1965, 54 y.o.   MRN: [id number]  Chief Complaint  Patient presents with  . Follow-up    Infection of prosthetic shoulder joint     HPI:  Kayla Lindsey is a 54 y.o. 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 Here today for routine follow up.  Kayla Lindsey continues to take her doxycycline as prescribed with no missed doses since her last appointment. Overall doing well with some continued right shoulder pain that is aggravated following increased activities. She has not needed any pain medications. No systemic symptoms of fevers, chills or sweats.     Allergies  Allergen Reactions  . Tape Rash    Redness      Outpatient Medications Prior to Visit  Medication Sig Dispense Refill  . albuterol (VENTOLIN HFA) 108 (90 Base) MCG/ACT inhaler Inhale 2 puffs into the lungs every 4 (four) hours as needed for wheezing or shortness of breath.    . Azelaic Acid 15 % cream Apply 1 application topically 2 (two) times daily. After skin is thoroughly washed and patted dry 50 g 11  . Biotin 10000 MCG TABS Take 10,000 mcg by mouth daily.     Marland Kitchen buPROPion (WELLBUTRIN) 75 MG tablet Take 75 mg by mouth daily.     . cetirizine (ZYRTEC) 10 MG tablet Take 10 mg by mouth daily.    Marland Kitchen EPINEPHrine (EPIPEN 2-PAK) 0.3 mg/0.3 mL IJ SOAJ injection Inject 0.3 mg into the muscle as needed for anaphylaxis.    . folic acid (FOLVITE) 009 MCG tablet Take 800 mcg by mouth daily.     Marland Kitchen levothyroxine (SYNTHROID) 125 MCG tablet Take 125 mcg by mouth daily.    . Sulfacetamide Sodium 10 % LIQD APPLY TO FACE TOPICALLY TWICE A DAY AS FACE [place]. (Patient taking differently: Apply 1 application topically in the morning and at bedtime. ) 177 mL 1  . venlafaxine XR (EFFEXOR-XR) 150 MG 24 hr capsule Take 1 capsule  (150 mg total) by mouth daily with breakfast. 90 capsule 1  . acetaminophen (TYLENOL) 500 MG tablet Take 1,000 mg by mouth every 6 (six) hours as needed for moderate pain or headache. (Patient not taking: Reported on 09/28/2019)    . AMBIEN 5 MG tablet Take 5 mg by mouth at bedtime as needed for sleep.  (Patient not taking: Reported on 09/28/2019)    . doxycycline (VIBRAMYCIN) 100 MG capsule TAKE 1 CAPSULE(100 MG) BY MOUTH TWICE DAILY 60 capsule 2  . hydroxychloroquine (PLAQUENIL) 200 MG tablet Take 200 mg by mouth 2 (two) times daily.  (Patient not taking: Reported on 09/28/2019)    . levothyroxine (SYNTHROID) 112 MCG tablet Take 1 tablet (112 mcg total) by mouth daily before breakfast. (Patient not taking: Reported on 05/15/2019) 90 tablet 0  . meloxicam (MOBIC) 7.5 MG tablet Take 1 tablet (7.5 mg total) by mouth daily. (Patient not taking: Reported on 09/28/2019) 30 tablet 2  . predniSONE (DELTASONE) 5 MG tablet Take 5 mg by mouth daily. (Patient not taking: Reported on 09/28/2019)    . traMADol (ULTRAM) 50 MG tablet Take 50 mg by mouth every 6 (six) hours as needed for moderate pain.  (Patient not taking: Reported on 09/28/2019)     No facility-administered medications prior to visit.     Past  Medical History:  Diagnosis Date  . Anxiety   . Depression   . Hashimoto's thyroiditis    hypo  . Hypothyroidism   . Mastocytosis   . Osteoarthritis    right shoulder  . Rheumatoid arthritis ([place])    mgd by Dr Jillyn Ledger, Maralyn Sago   . Vitiligo      Past Surgical History:  Procedure Laterality Date  . APPENDECTOMY    . BREAST BIOPSY Bilateral   . BREAST LUMPECTOMY Right   . carpel tunnel Bilateral   . CHOLECYSTECTOMY    . HIP SURGERY Left 02/2007   laboral repair   . SHOULDER ACROMIOPLASTY Right   . TOTAL SHOULDER ARTHROPLASTY Right 04/20/2019   Procedure: TOTAL SHOULDER ARTHROPLASTY;  Surgeon: Hiram Gash, MD;  Location: [facility];  Service: Orthopedics;  Laterality: Right;         Review of Systems  Constitutional: Negative for chills, diaphoresis, fatigue and fever.  Respiratory: Negative for cough, chest tightness, shortness of breath and wheezing.   Cardiovascular: Negative for chest pain.  Gastrointestinal: Negative for abdominal pain, diarrhea, nausea and vomiting.      Objective:    BP 122/81   Pulse 86   Temp 97.9 F (36.6 C) (Oral)   Wt 187 lb (84.8 kg)   BMI 29.29 kg/m  Nursing note and vital signs reviewed.  Physical Exam Constitutional:      General: She is not in acute distress.    Appearance: She is well-developed.  Cardiovascular:     Rate and Rhythm: Normal rate and regular rhythm.     Heart sounds: Normal heart sounds.  Pulmonary:     Effort: Pulmonary effort is normal.     Breath sounds: Normal breath sounds.  Skin:    General: Skin is warm and dry.  Neurological:     Mental Status: She is alert and oriented to person, place, and time.  Psychiatric:        Behavior: Behavior normal.        Thought Content: Thought content normal.        Judgment: Judgment normal.      Depression screen [facility] 2/9 08/03/2019 05/12/2019 09/13/2018 01/20/2018 12/23/2017  Decreased Interest 0 0 1 3 3   Down, Depressed, Hopeless 1 0 2 2 3   PHQ - 2 Score 1 0 3 5 6   Altered sleeping - - 2 2 3   Tired, decreased energy - - 3 3 3   Change in appetite - - 2 0 0  Feeling bad or failure about yourself  - - 2 2 3   Trouble concentrating - - 3 3 3   Moving slowly or fidgety/restless - - 1 0 2  Suicidal thoughts - - 1 0 0  PHQ-9 Score - - 17 15 20   Difficult doing work/chores - - Somewhat difficult Very difficult Very difficult       Assessment & Plan:    Patient Active Problem List   Diagnosis Date Noted  . 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 De Quervain's disease (tenosynovitis) 11/10/2018  . Shoulder pain, right 06/23/2018  . Lesion of mouth 12/23/2017  . Esophageal dysphagia 12/23/2017  . Mastocytosis 12/23/2017  .  Vitiligo 12/23/2017  . Rosacea 12/23/2017  . Hypothyroidism 12/23/2017  . 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 Discussed treatment options of continued suppression with doxycycline as no further surgical intervention is planned and for retained prosthesis versus stopping antimicrobial therapy. She is in favor of stopping at this time and I agree with this plan. We discussed the possibility that infection may possibly return and she understands. Will discontinue doxycycline at this time and monitor off antibiotics. Instructed to monitor for signs of infection. Follow up with ID as needed.           I have discontinued [place]. Hensen "Kim"'s Sulfacetamide Sodium, hydroxychloroquine, meloxicam, traMADol, Ambien, predniSONE, acetaminophen, and doxycycline. I am also having her maintain her venlafaxine XR, Azelaic Acid, buPROPion, folic acid, Biotin, levothyroxine, cetirizine, EPINEPHrine, and albuterol.   Follow-up: Return if symptoms worsen or fail to improve.   Terri Piedra, MSN, FNP-C Nurse Practitioner [facility] for Infectious Disease [place] number: [phone]

## 2019-09-28 NOTE — Assessment & Plan Note (Signed)
Ms. 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 Discussed treatment options of continued suppression with doxycycline as no further surgical intervention is planned and for retained prosthesis versus stopping antimicrobial therapy. She is in favor of stopping at this time and I agree with this plan. We discussed the possibility that infection may possibly return and she understands. Will discontinue doxycycline at this time and monitor off antibiotics. Instructed to monitor for signs of infection. Follow up with ID as needed.

## 2020-04-13 IMAGING — CR DG SHOULDER 2+V PORT*R*
1 series · 1 of 1 positions shown · non-contrast
Comparison: 02/17/2018

CLINICAL DATA: Postoperative check

EXAM:
PORTABLE RIGHT SHOULDER

[shoulder ap]
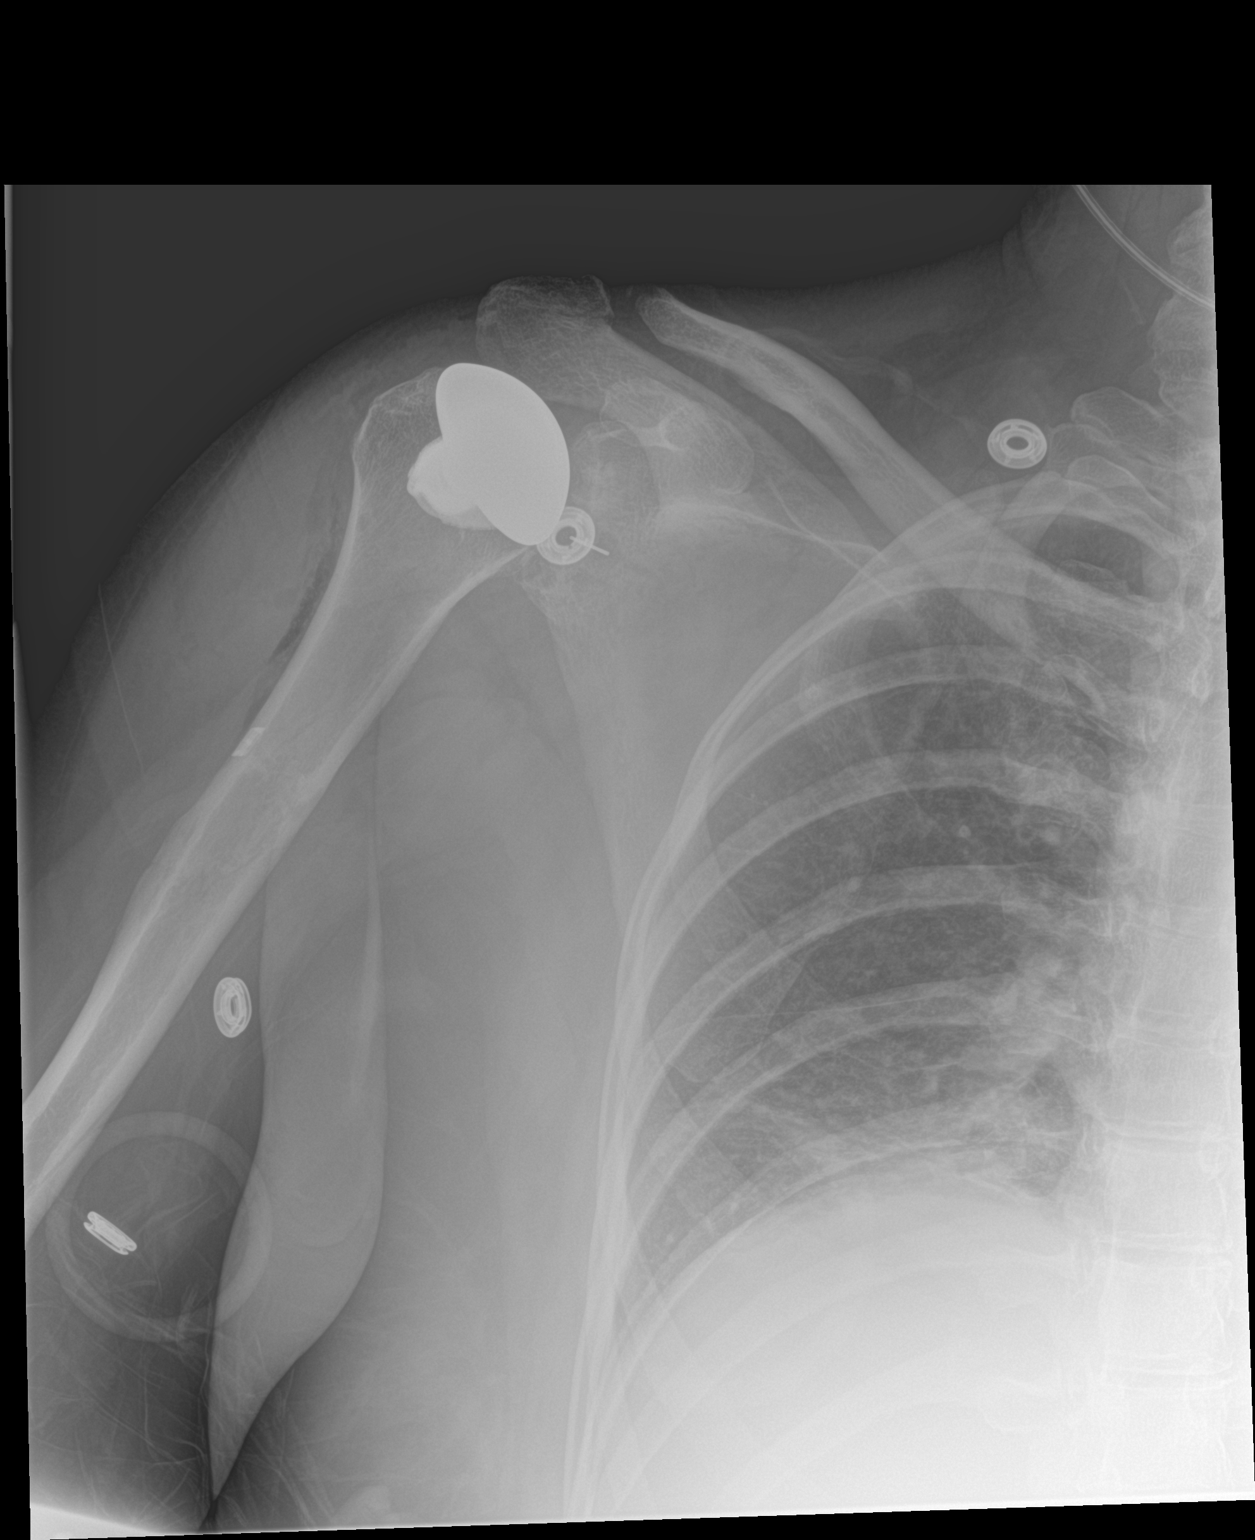

[1 of 1 positions shown; findings below may reference images not displayed]

FINDINGS: Single view shows total glenohumeral arthroplasty. No evidence of
periprosthetic fracture and alignment is unremarkable in the frontal
view.
IMPRESSION: Negative.
